# Patient Record
Sex: Male | Born: 1935 | Race: White | Hispanic: No | Marital: Married | Smoking: Former smoker
Health system: Southern US, Community
[De-identification: ages and names within clinical notes are randomized; demographics above are authoritative.]

## PROBLEM LIST (undated history)

## (undated) DIAGNOSIS — H9190 Unspecified hearing loss, unspecified ear: Secondary | ICD-10-CM

## (undated) DIAGNOSIS — Z978 Presence of other specified devices: Secondary | ICD-10-CM

## (undated) DIAGNOSIS — Z789 Other specified health status: Secondary | ICD-10-CM

## (undated) DIAGNOSIS — R339 Retention of urine, unspecified: Secondary | ICD-10-CM

---
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
## 2017-01-16 DIAGNOSIS — N401 Enlarged prostate with lower urinary tract symptoms: Secondary | ICD-10-CM | POA: Diagnosis not present

## 2017-01-16 DIAGNOSIS — R350 Frequency of micturition: Secondary | ICD-10-CM | POA: Diagnosis not present

## 2017-01-16 DIAGNOSIS — N138 Other obstructive and reflux uropathy: Secondary | ICD-10-CM | POA: Diagnosis not present

## 2017-01-16 DIAGNOSIS — J431 Panlobular emphysema: Secondary | ICD-10-CM | POA: Diagnosis not present

## 2017-04-17 DIAGNOSIS — J431 Panlobular emphysema: Secondary | ICD-10-CM | POA: Diagnosis not present

## 2017-04-17 DIAGNOSIS — N401 Enlarged prostate with lower urinary tract symptoms: Secondary | ICD-10-CM | POA: Diagnosis not present

## 2017-04-17 DIAGNOSIS — N138 Other obstructive and reflux uropathy: Secondary | ICD-10-CM | POA: Diagnosis not present

## 2017-10-09 DIAGNOSIS — Z23 Encounter for immunization: Secondary | ICD-10-CM | POA: Diagnosis not present

## 2018-03-02 DIAGNOSIS — J431 Panlobular emphysema: Secondary | ICD-10-CM | POA: Diagnosis not present

## 2018-03-02 DIAGNOSIS — N138 Other obstructive and reflux uropathy: Secondary | ICD-10-CM | POA: Diagnosis not present

## 2018-03-02 DIAGNOSIS — N401 Enlarged prostate with lower urinary tract symptoms: Secondary | ICD-10-CM | POA: Diagnosis not present

## 2018-09-07 DIAGNOSIS — Z23 Encounter for immunization: Secondary | ICD-10-CM | POA: Diagnosis not present

## 2019-02-03 DIAGNOSIS — J431 Panlobular emphysema: Secondary | ICD-10-CM | POA: Diagnosis not present

## 2019-02-03 DIAGNOSIS — N138 Other obstructive and reflux uropathy: Secondary | ICD-10-CM | POA: Diagnosis not present

## 2019-02-03 DIAGNOSIS — N401 Enlarged prostate with lower urinary tract symptoms: Secondary | ICD-10-CM | POA: Diagnosis not present

## 2019-02-03 DIAGNOSIS — M1711 Unilateral primary osteoarthritis, right knee: Secondary | ICD-10-CM | POA: Diagnosis not present

## 2019-03-13 ENCOUNTER — Emergency Department (HOSPITAL_BASED_OUTPATIENT_CLINIC_OR_DEPARTMENT_OTHER)
Admission: EM | Admit: 2019-03-13 | Discharge: 2019-03-13 | Disposition: A | Discharge status: Home / Self Care | Payer: Medicare Other | Attending: Emergency Medicine | Admitting: Emergency Medicine

## 2019-03-13 ENCOUNTER — Other Ambulatory Visit: Payer: Self-pay

## 2019-03-13 DIAGNOSIS — Z79899 Other long term (current) drug therapy: Secondary | ICD-10-CM | POA: Diagnosis not present

## 2019-03-13 DIAGNOSIS — E871 Hypo-osmolality and hyponatremia: Secondary | ICD-10-CM | POA: Insufficient documentation

## 2019-03-13 DIAGNOSIS — R339 Retention of urine, unspecified: Secondary | ICD-10-CM | POA: Diagnosis not present

## 2019-03-13 LAB — BASIC METABOLIC PANEL
Anion gap: 9 (ref 5–15)
BUN: 18 mg/dL (ref 8–23)
CO2: 24 mmol/L (ref 22–32)
Calcium: 8.6 mg/dL — ABNORMAL LOW (ref 8.9–10.3)
Chloride: 96 mmol/L — ABNORMAL LOW (ref 98–111)
Creatinine, Ser: 0.84 mg/dL (ref 0.61–1.24)
GFR calc Af Amer: >60 mL/min (ref >60)
GFR calc non Af Amer: >60 mL/min (ref >60)
Glucose, Bld: 102 mg/dL — ABNORMAL HIGH (ref 70–99)
Potassium: 3.4 mmol/L — ABNORMAL LOW (ref 3.5–5.1)
Sodium: 129 mmol/L — ABNORMAL LOW (ref 135–145)

## 2019-03-13 LAB — CBC WITH DIFFERENTIAL/PLATELET
Abs Immature Granulocytes: 0.06 10*3/uL (ref 0.00–0.07)
Basophils Absolute: 0 10*3/uL (ref 0.0–0.1)
Basophils Relative: 0 %
Eosinophils Absolute: 0.4 10*3/uL (ref 0.0–0.5)
Eosinophils Relative: 3 %
HCT: 42.7 % (ref 39.0–52.0)
Hemoglobin: 14.3 g/dL (ref 13.0–17.0)
Immature Granulocytes: 1 %
Lymphocytes Relative: 14 %
Lymphs Abs: 1.7 10*3/uL (ref 0.7–4.0)
MCH: 32.4 pg (ref 26.0–34.0)
MCHC: 33.5 g/dL (ref 30.0–36.0)
MCV: 96.6 fL (ref 80.0–100.0)
Monocytes Absolute: 1.4 10*3/uL — ABNORMAL HIGH (ref 0.1–1.0)
Monocytes Relative: 12 %
Neutro Abs: 8.5 10*3/uL — ABNORMAL HIGH (ref 1.7–7.7)
Neutrophils Relative %: 70 %
Platelets: 161 10*3/uL (ref 150–400)
RBC: 4.42 MIL/uL (ref 4.22–5.81)
RDW: 12.8 % (ref 11.5–15.5)
WBC: 12.1 10*3/uL — ABNORMAL HIGH (ref 4.0–10.5)
nRBC: 0 % (ref 0.0–0.2)

## 2019-03-13 LAB — URINALYSIS, MICROSCOPIC (REFLEX)

## 2019-03-13 LAB — URINALYSIS, ROUTINE W REFLEX MICROSCOPIC
Bilirubin Urine: NEGATIVE
Glucose, UA: NEGATIVE mg/dL
Ketones, ur: NEGATIVE mg/dL
Leukocytes,Ua: NEGATIVE
Nitrite: NEGATIVE
Protein, ur: NEGATIVE mg/dL
Specific Gravity, Urine: 1.01 (ref 1.005–1.030)
pH: 6 (ref 5.0–8.0)

## 2019-03-13 NOTE — ED Notes (Signed)
Natanael Saladin (son) Contact information [phone]. Please call when patient is being discharged.

## 2019-03-13 NOTE — ED Provider Notes (Signed)
[facility] EMERGENCY DEPARTMENT Provider Note   CSN: [id number] Arrival date & time: 03/13/19  1903    History   Chief Complaint Chief Complaint  Patient presents with  . Urinary Retention    HPI Donald Ellis is a 85 y.o. male.     Patient is a 85 year old male presents with difficulty urinating.  He does have a history of BPH and has been on Flomax for about 2 years.  He reports that over the last week he has had trouble getting his urine out and today has only been able to get little dribbles out.  He says it hurts when he tries to urinate.  He has increasing distention and pain to his lower abdomen.  No fevers.  No nausea or vomiting.  No history of similar symptoms in the past.     No past medical history on file.  There are no active problems to display for this patient.    The histories are not reviewed yet. Please review them in the "History" navigator section and refresh this [place].      Home Medications    Prior to Admission medications   Medication Sig Start Date End Date Taking? Authorizing Provider  ipratropium-albuterol (DUONEB) 0.5-2.5 (3) MG/3ML SOLN Inhale 3 ml vial three times a day in nebulizer 02/03/19  Yes [provider]  meloxicam (MOBIC) 15 MG tablet Take 15 mg by mouth daily.   Yes [provider]  tamsulosin (FLOMAX) 0.4 MG CAPS capsule Take 0.4 mg by mouth.   Yes [provider]    Family History No family history on file.  Social History Social History   Tobacco Use  . Smoking status: Not on file  Substance Use Topics  . Alcohol use: Not on file  . Drug use: Not on file     Allergies   Patient has no allergy information on record.   Review of Systems Review of Systems  Constitutional: Negative for chills, diaphoresis, fatigue and fever.  HENT: Negative for congestion, rhinorrhea and sneezing.   Eyes: Negative.   Respiratory: Negative for cough, chest tightness and shortness of  breath.   Cardiovascular: Negative for chest pain and leg swelling.  Gastrointestinal: Positive for abdominal pain. Negative for blood in stool, diarrhea, nausea and vomiting.  Genitourinary: Positive for difficulty urinating, dysuria and urgency. Negative for flank pain, frequency, hematuria, penile pain, scrotal swelling and testicular pain.  Musculoskeletal: Negative for arthralgias and back pain.  Skin: Negative for rash.  Neurological: Negative for dizziness, speech difficulty, weakness, numbness and headaches.     Physical Exam Updated Vital Signs BP (!) 156/81 (BP Location: Right Arm)   Pulse 99   Temp 97.6 F (36.4 C) (Oral)   Resp 17   Ht 5\' 9"  (1.753 m)   Wt 72.6 kg   SpO2 95%   BMI 23.63 kg/m   Physical Exam Constitutional:      Appearance: He is well-developed.  HENT:     Head: Normocephalic and atraumatic.  Eyes:     Pupils: Pupils are equal, round, and reactive to light.  Neck:     Musculoskeletal: Normal range of motion and neck supple.  Cardiovascular:     Rate and Rhythm: Normal rate and regular rhythm.     Heart sounds: Normal heart sounds.  Pulmonary:     Effort: Pulmonary effort is normal. No respiratory distress.     Breath sounds: Normal breath sounds. No wheezing or rales.  Chest:  Chest wall: No tenderness.  Abdominal:     General: Bowel sounds are normal. There is distension.     Palpations: Abdomen is soft.     Tenderness: There is no abdominal tenderness. There is no guarding or rebound.     Comments: Positive tenderness and distention to the lower abdomen  Genitourinary:    Comments: Normal-appearing circumcised male genitalia. Musculoskeletal: Normal range of motion.  Lymphadenopathy:     Cervical: No cervical adenopathy.  Skin:    General: Skin is warm and dry.     Findings: No rash.  Neurological:     Mental Status: He is alert and oriented to person, place, and time.      ED Treatments / Results  Labs (all labs ordered  are listed, but only abnormal results are displayed) Labs Reviewed  BASIC METABOLIC PANEL - Abnormal; Notable for the following components:      Result Value   Sodium 129 (*)    Potassium 3.4 (*)    Chloride 96 (*)    Glucose, Bld 102 (*)    Calcium 8.6 (*)    All other components within normal limits  CBC WITH DIFFERENTIAL/PLATELET - Abnormal; Notable for the following components:   WBC 12.1 (*)    Neutro Abs 8.5 (*)    Monocytes Absolute 1.4 (*)    All other components within normal limits  URINALYSIS, ROUTINE W REFLEX MICROSCOPIC - Abnormal; Notable for the following components:   Hgb urine dipstick SMALL (*)    All other components within normal limits  URINALYSIS, MICROSCOPIC (REFLEX) - Abnormal; Notable for the following components:   Bacteria, UA RARE (*)    All other components within normal limits  URINE CULTURE    EKG None  Radiology No results found.  Procedures Procedures (including critical care time)  Medications Ordered in ED Medications - No data to display   Initial Impression / Assessment and Plan / ED Course  I have reviewed the triage vital signs and the nursing notes.  Pertinent labs & imaging results that were available during my care of the patient were reviewed by me and considered in my medical decision making (see chart for details).        Patient presents with urinary retention.  A Foley catheter was placed and drained about 1200 cc of clear urine.  His urine does not appear to be infected.  It was sent for culture.  He was not started on antibiotics.  He feels 100% better.  His labs show a mild hyponatremia.  I discussed this with him and advised him that he will need to have this rechecked by his primary care doctor.  His creatinine is normal.  He was advised to call Monday make an appointment next week to follow-up with his PCP regarding catheter removal and recheck on his sodium.  Return precautions were given.  He was advised to continue  his Flomax.  Final Clinical Impressions(s) / ED Diagnoses   Final diagnoses:  Urinary retention  Hyponatremia    ED Discharge Orders    None       Malvin Johns, MD 03/13/19 2033

## 2019-03-13 NOTE — ED Notes (Signed)
Pt hard of hearing-- poor historian- medical history/medication unknown if accurate.

## 2019-03-13 NOTE — Discharge Instructions (Signed)
You will need to have your catheter removed by your primary care doctor.  Make an appointment to follow-up with them next week.  In addition, your sodium level was low at 129 and this needs to be rechecked by your primary care doctor.

## 2019-03-13 NOTE — ED Notes (Signed)
Foley Catheter unclamped at this time.

## 2019-03-13 NOTE — ED Triage Notes (Signed)
Pt presents with urinary retention x 3 days. Pt bent over in pain. HOH. Reports burning and dribbling urine.

## 2019-03-15 DIAGNOSIS — E871 Hypo-osmolality and hyponatremia: Secondary | ICD-10-CM | POA: Diagnosis not present

## 2019-03-15 DIAGNOSIS — R338 Other retention of urine: Secondary | ICD-10-CM | POA: Diagnosis not present

## 2019-03-15 DIAGNOSIS — K5900 Constipation, unspecified: Secondary | ICD-10-CM | POA: Diagnosis not present

## 2019-03-15 LAB — URINE CULTURE: Culture: NO GROWTH

## 2019-03-23 DIAGNOSIS — R3912 Poor urinary stream: Secondary | ICD-10-CM | POA: Diagnosis not present

## 2019-03-23 DIAGNOSIS — R350 Frequency of micturition: Secondary | ICD-10-CM | POA: Diagnosis not present

## 2019-03-23 DIAGNOSIS — R351 Nocturia: Secondary | ICD-10-CM | POA: Diagnosis not present

## 2019-03-29 DIAGNOSIS — R351 Nocturia: Secondary | ICD-10-CM | POA: Diagnosis not present

## 2019-03-29 DIAGNOSIS — R350 Frequency of micturition: Secondary | ICD-10-CM | POA: Diagnosis not present

## 2019-03-30 DIAGNOSIS — N138 Other obstructive and reflux uropathy: Secondary | ICD-10-CM | POA: Diagnosis not present

## 2019-03-30 DIAGNOSIS — N401 Enlarged prostate with lower urinary tract symptoms: Secondary | ICD-10-CM | POA: Diagnosis not present

## 2019-03-30 DIAGNOSIS — K5900 Constipation, unspecified: Secondary | ICD-10-CM | POA: Diagnosis not present

## 2019-03-30 DIAGNOSIS — J431 Panlobular emphysema: Secondary | ICD-10-CM | POA: Diagnosis not present

## 2019-04-06 DIAGNOSIS — R3912 Poor urinary stream: Secondary | ICD-10-CM | POA: Diagnosis not present

## 2019-04-06 DIAGNOSIS — R338 Other retention of urine: Secondary | ICD-10-CM | POA: Diagnosis not present

## 2019-04-07 DIAGNOSIS — R351 Nocturia: Secondary | ICD-10-CM | POA: Diagnosis not present

## 2019-04-07 DIAGNOSIS — R338 Other retention of urine: Secondary | ICD-10-CM | POA: Diagnosis not present

## 2019-04-16 DIAGNOSIS — N401 Enlarged prostate with lower urinary tract symptoms: Secondary | ICD-10-CM | POA: Diagnosis not present

## 2019-04-16 DIAGNOSIS — R351 Nocturia: Secondary | ICD-10-CM | POA: Diagnosis not present

## 2019-05-05 DIAGNOSIS — N401 Enlarged prostate with lower urinary tract symptoms: Secondary | ICD-10-CM | POA: Diagnosis not present

## 2019-05-05 DIAGNOSIS — R351 Nocturia: Secondary | ICD-10-CM | POA: Diagnosis not present

## 2019-05-05 DIAGNOSIS — R338 Other retention of urine: Secondary | ICD-10-CM | POA: Diagnosis not present

## 2019-05-17 DIAGNOSIS — R350 Frequency of micturition: Secondary | ICD-10-CM | POA: Diagnosis not present

## 2019-05-17 DIAGNOSIS — R351 Nocturia: Secondary | ICD-10-CM | POA: Diagnosis not present

## 2019-05-17 DIAGNOSIS — N401 Enlarged prostate with lower urinary tract symptoms: Secondary | ICD-10-CM | POA: Diagnosis not present

## 2019-05-19 DIAGNOSIS — N401 Enlarged prostate with lower urinary tract symptoms: Secondary | ICD-10-CM | POA: Diagnosis not present

## 2019-05-19 DIAGNOSIS — R338 Other retention of urine: Secondary | ICD-10-CM | POA: Diagnosis not present

## 2019-05-20 DIAGNOSIS — R338 Other retention of urine: Secondary | ICD-10-CM | POA: Diagnosis not present

## 2019-05-20 DIAGNOSIS — N401 Enlarged prostate with lower urinary tract symptoms: Secondary | ICD-10-CM | POA: Diagnosis not present

## 2019-05-27 DIAGNOSIS — N401 Enlarged prostate with lower urinary tract symptoms: Secondary | ICD-10-CM | POA: Diagnosis not present

## 2019-05-27 DIAGNOSIS — R338 Other retention of urine: Secondary | ICD-10-CM | POA: Diagnosis not present

## 2019-05-29 ENCOUNTER — Other Ambulatory Visit: Payer: Self-pay

## 2019-05-29 ENCOUNTER — Encounter (HOSPITAL_COMMUNITY): Payer: Self-pay | Admitting: Emergency Medicine

## 2019-05-29 ENCOUNTER — Emergency Department (HOSPITAL_COMMUNITY)
Admission: EM | Admit: 2019-05-29 | Discharge: 2019-05-29 | Disposition: A | Discharge status: Home / Self Care | Payer: Medicare Other | Attending: Emergency Medicine | Admitting: Emergency Medicine

## 2019-05-29 DIAGNOSIS — N3001 Acute cystitis with hematuria: Secondary | ICD-10-CM | POA: Diagnosis not present

## 2019-05-29 DIAGNOSIS — I10 Essential (primary) hypertension: Secondary | ICD-10-CM | POA: Diagnosis not present

## 2019-05-29 DIAGNOSIS — Z79899 Other long term (current) drug therapy: Secondary | ICD-10-CM | POA: Insufficient documentation

## 2019-05-29 DIAGNOSIS — R1084 Generalized abdominal pain: Secondary | ICD-10-CM | POA: Diagnosis not present

## 2019-05-29 DIAGNOSIS — R339 Retention of urine, unspecified: Secondary | ICD-10-CM | POA: Diagnosis not present

## 2019-05-29 DIAGNOSIS — R0902 Hypoxemia: Secondary | ICD-10-CM | POA: Diagnosis not present

## 2019-05-29 DIAGNOSIS — R52 Pain, unspecified: Secondary | ICD-10-CM | POA: Diagnosis not present

## 2019-05-29 LAB — CBC WITH DIFFERENTIAL/PLATELET
Abs Immature Granulocytes: 0.04 10*3/uL (ref 0.00–0.07)
Basophils Absolute: 0.1 10*3/uL (ref 0.0–0.1)
Basophils Relative: 1 %
Eosinophils Absolute: 0.2 10*3/uL (ref 0.0–0.5)
Eosinophils Relative: 2 %
HCT: 42.8 % (ref 39.0–52.0)
Hemoglobin: 14.6 g/dL (ref 13.0–17.0)
Immature Granulocytes: 0 %
Lymphocytes Relative: 11 %
Lymphs Abs: 1.1 10*3/uL (ref 0.7–4.0)
MCH: 32.5 pg (ref 26.0–34.0)
MCHC: 34.1 g/dL (ref 30.0–36.0)
MCV: 95.3 fL (ref 80.0–100.0)
Monocytes Absolute: 1 10*3/uL (ref 0.1–1.0)
Monocytes Relative: 10 %
Neutro Abs: 7.8 10*3/uL — ABNORMAL HIGH (ref 1.7–7.7)
Neutrophils Relative %: 76 %
Platelets: 150 10*3/uL (ref 150–400)
RBC: 4.49 MIL/uL (ref 4.22–5.81)
RDW: 12.8 % (ref 11.5–15.5)
WBC: 10.1 10*3/uL (ref 4.0–10.5)
nRBC: 0 % (ref 0.0–0.2)

## 2019-05-29 LAB — BASIC METABOLIC PANEL
Anion gap: 10 (ref 5–15)
BUN: 22 mg/dL (ref 8–23)
CO2: 23 mmol/L (ref 22–32)
Calcium: 9 mg/dL (ref 8.9–10.3)
Chloride: 103 mmol/L (ref 98–111)
Creatinine, Ser: 0.92 mg/dL (ref 0.61–1.24)
GFR calc Af Amer: >60 mL/min (ref >60)
GFR calc non Af Amer: >60 mL/min (ref >60)
Glucose, Bld: 105 mg/dL — ABNORMAL HIGH (ref 70–99)
Potassium: 4 mmol/L (ref 3.5–5.1)
Sodium: 136 mmol/L (ref 135–145)

## 2019-05-29 LAB — URINALYSIS, ROUTINE W REFLEX MICROSCOPIC
Bacteria, UA: NONE SEEN
Bilirubin Urine: NEGATIVE
Glucose, UA: NEGATIVE mg/dL
Ketones, ur: NEGATIVE mg/dL
Nitrite: NEGATIVE
Protein, ur: 100 mg/dL — AB
RBC / HPF: >50 RBC/hpf — ABNORMAL HIGH (ref 0–5)
Specific Gravity, Urine: 1.013 (ref 1.005–1.030)
WBC, UA: >50 WBC/hpf — ABNORMAL HIGH (ref 0–5)
pH: 6 (ref 5.0–8.0)

## 2019-05-29 MED ORDER — CEPHALEXIN 500 MG PO CAPS: 500.0000 mg | ORAL_CAPSULE | Freq: Three times a day (TID) | ORAL | 0 refills | Status: DC

## 2019-05-29 MED ORDER — CEPHALEXIN 250 MG PO CAPS
500.0000 mg | ORAL_CAPSULE | Freq: Once | ORAL | Status: AC
  Administered 2019-05-29: 500 mg via ORAL
  Filled 2019-05-29: qty 2

## 2019-05-29 NOTE — ED Triage Notes (Signed)
BIB [facility] from home with urinary retention since 2100 yesterday with associated abdominal pain. Bladder scanned 979 on arrival. Pt reports having recent condom cath and some type of urinary implant.

## 2019-05-29 NOTE — Discharge Instructions (Signed)
Please call your urologist/Dr. Alyson Ingles on Monday.  Please tell them that you had another catheter placed.  Tell them that you are also on an antibiotic for an infection.  Be sure to continue taking your Flomax. Do not remove the catheter

## 2019-05-29 NOTE — ED Notes (Signed)
Pt states he is on 2L [place] @ home.

## 2019-05-29 NOTE — ED Provider Notes (Signed)
[facility] EMERGENCY DEPARTMENT Provider Note   CSN: [id number] Arrival date & time: 05/29/19  0535     History   Chief Complaint Chief complaint - abdominal pain   HPI Donald Ellis is a 85 y.o. male.     The history is provided by the patient and the EMS personnel.  Abdominal Pain Pain location:  Suprapubic Pain quality: aching   Pain radiates to:  Does not radiate Pain severity:  Severe Onset quality:  Sudden Timing:  Constant Progression:  Worsening Chronicity:  New Relieved by:  Nothing Worsened by:  Movement and palpation Associated symptoms: no fever and no vomiting   patient with history of COPD, BPH presents with lower abdominal pain.  He reports urinary retention. He reports Similar episode in the past.   PMH- COPD, BPH Soc hx - former smoker Home Medications    Prior to Admission medications   Medication Sig Start Date End Date Taking? Authorizing Provider  ipratropium-albuterol (DUONEB) 0.5-2.5 (3) MG/3ML SOLN Inhale 3 ml vial three times a day in nebulizer 02/03/19   [provider]  meloxicam (MOBIC) 15 MG tablet Take 15 mg by mouth daily.    [provider]  tamsulosin (FLOMAX) 0.4 MG CAPS capsule Take 0.4 mg by mouth.    [provider]    Family History No family history on file.  Social History Social History   Tobacco Use  . Smoking status: Not on file  Substance Use Topics  . Alcohol use: Not on file  . Drug use: Not on file     Allergies   Patient has no allergy information on record.   Review of Systems Review of Systems  Constitutional: Negative for fever.  Gastrointestinal: Positive for abdominal pain. Negative for vomiting.  Genitourinary: Positive for difficulty urinating.  All other systems reviewed and are negative.    Physical Exam Updated Vital Signs BP 128/76 (BP Location: Right Arm)   Pulse 90   Temp 98.1 F (36.7 C)   Resp 18   Ht 1.778 m (5\' 10" )   Wt 72.6 kg    SpO2 (!) 87%   BMI 22.96 kg/m   Physical Exam CONSTITUTIONAL: elderly, anxious, moaning in pain, hard of hearing HEAD: Normocephalic/atraumatic EYES: EOMI ENMT: Mucous membranes moist NECK: supple no meningeal signs SPINE/BACK:entire spine nontender CV: S1/S2 noted, no murmurs/rubs/gallops noted LUNGS: Mild tachypnea noted, scattered wheezing noted ABDOMEN: soft, nontender, no rebound or guarding, bowel sounds noted throughout abdomen GU:no cva tenderness, normal external genitalia NEURO: Pt is awake/alert/appropriate, moves all extremitiesx4.  No facial droop.   EXTREMITIES: pulses normal/equal, full ROM SKIN: warm, color normal PSYCH: Anxious  ED Treatments / Results  Labs (all labs ordered are listed, but only abnormal results are displayed) Labs Reviewed  URINALYSIS, ROUTINE W REFLEX MICROSCOPIC - Abnormal; Notable for the following components:      Result Value   APPearance TURBID (*)    Hgb urine dipstick LARGE (*)    Protein, ur 100 (*)    Leukocytes,Ua LARGE (*)    RBC / HPF >50 (*)    WBC, UA >50 (*)    All other components within normal limits  BASIC METABOLIC PANEL - Abnormal; Notable for the following components:   Glucose, Bld 105 (*)    All other components within normal limits  CBC WITH DIFFERENTIAL/PLATELET - Abnormal; Notable for the following components:   Neutro Abs 7.8 (*)    All other components within normal limits  URINE CULTURE  EKG    Radiology No results found.  Procedures Procedures Medications Ordered in ED Medications  cephALEXin (KEFLEX) capsule 500 mg (has no administration in time range)     Initial Impression / Assessment and Plan / ED Course  I have reviewed the triage vital signs and the nursing notes.  Pertinent labs  results that were available during my care of the patient were reviewed by me and considered in my medical decision making (see chart for details).        5:46 AM Patient presents with abdominal  pain and difficulty urinating.  Strong suspicion this represents urinary retention.  Will obtain bladder scan 6:24 AM Patient had over 900 cc of urine.  He had immediate relief upon Foley catheter placement It is reported patient had a recent urologic procedure, but it is unclear what procedure. 6:47 AM Patient improved, urine is draining into Foley. He has no further abdominal tenderness.  He feels well. He has evidence of UTI, will start him on Keflex 3 times daily Patient handed me a card that reveals he had a UroLift procedure done on June 8.  This was done by Dr. Alyson Ingles from urology for BPH Advised patient to call his urologist in 2 days to inform them he had to place a catheter and be placed on antibiotics.  Patient is overall well-appearing, not septic appearing, no acute distress. Initial hypoxia resolved as he is on 2 L nasal cannula daily  Final Clinical Impressions(s) / ED Diagnoses   Final diagnoses:  Urinary retention  Acute cystitis with hematuria    ED Discharge Orders         Ordered    cephALEXin (KEFLEX) 500 MG capsule  3 times daily     05/29/19 0636           Ripley Fraise, MD 05/29/19 [phone]

## 2019-05-29 NOTE — ED Notes (Signed)
Patient verbalizes understanding of discharge instructions. Opportunity for questioning and answers were provided. Armband removed by staff, pt discharged from ED home via POV with family. 

## 2019-05-31 LAB — URINE CULTURE
Culture: >=100000 — AB
Special Requests: NORMAL

## 2019-06-01 ENCOUNTER — Telehealth: Payer: Self-pay | Admitting: Emergency Medicine

## 2019-06-01 NOTE — Telephone Encounter (Signed)
Post ED Visit - Positive Culture Follow-up: Successful Patient Follow-Up  Culture assessed and recommendations reviewed by:  []  Elenor Quinones, Pharm.D. []  Heide Guile, Pharm.D., BCPS AQ-ID []  Parks Neptune, Pharm.D., BCPS []  Alycia Rossetti, Pharm.D., BCPS []  [place], [place].D., BCPS, AAHIVP []  Legrand Como, Pharm.D., BCPS, AAHIVP []  Salome Arnt, PharmD, BCPS [x]  Johnnette Gourd, PharmD, BCPS []  Hughes Better, PharmD, BCPS []  Leeroy Cha, PharmD  Positive urine culture  []  Patient discharged without antimicrobial prescription and treatment is now indicated [x]  Organism is resistant to prescribed ED discharge antimicrobial []  Patient with positive blood cultures  Changes discussed with ED provider: Langston Masker PA New antibiotic prescription stop cephalexin, start septra 1 ds tab po bid x 14 days  Attempting to contact patient   Hazle Nordmann 06/01/2019, 11:05 AM

## 2019-06-07 DIAGNOSIS — N3000 Acute cystitis without hematuria: Secondary | ICD-10-CM | POA: Diagnosis not present

## 2019-06-07 DIAGNOSIS — R338 Other retention of urine: Secondary | ICD-10-CM | POA: Diagnosis not present

## 2019-06-10 DIAGNOSIS — R338 Other retention of urine: Secondary | ICD-10-CM | POA: Diagnosis not present

## 2019-06-21 ENCOUNTER — Other Ambulatory Visit: Payer: Self-pay | Admitting: Urology

## 2019-06-24 DIAGNOSIS — M1711 Unilateral primary osteoarthritis, right knee: Secondary | ICD-10-CM | POA: Diagnosis not present

## 2019-06-28 ENCOUNTER — Other Ambulatory Visit (HOSPITAL_COMMUNITY)
Admission: RE | Admit: 2019-06-28 | Discharge: 2019-06-28 | Disposition: A | Discharge status: Home / Self Care | Payer: Medicare Other | Source: Ambulatory Visit | Attending: Urology | Admitting: Urology

## 2019-06-28 DIAGNOSIS — Z1159 Encounter for screening for other viral diseases: Secondary | ICD-10-CM | POA: Insufficient documentation

## 2019-06-28 DIAGNOSIS — R338 Other retention of urine: Secondary | ICD-10-CM | POA: Diagnosis not present

## 2019-06-28 DIAGNOSIS — R310 Gross hematuria: Secondary | ICD-10-CM | POA: Diagnosis not present

## 2019-06-29 ENCOUNTER — Other Ambulatory Visit: Payer: Self-pay

## 2019-06-29 ENCOUNTER — Encounter (HOSPITAL_BASED_OUTPATIENT_CLINIC_OR_DEPARTMENT_OTHER): Payer: Self-pay | Admitting: *Deleted

## 2019-06-29 LAB — SARS CORONAVIRUS 2 (TAT 6-24 HRS): SARS Coronavirus 2: NEGATIVE

## 2019-06-29 NOTE — Progress Notes (Signed)
Spoke with patient and wife via telephone for pre op interview. Patient very Donald Ellis, wife will need to be in pre op. NPO after MN. Patient to take Flomax AM of surgery with a sip of water. Arrival time 1030.

## 2019-07-01 ENCOUNTER — Other Ambulatory Visit: Payer: Self-pay

## 2019-07-01 ENCOUNTER — Ambulatory Visit (HOSPITAL_BASED_OUTPATIENT_CLINIC_OR_DEPARTMENT_OTHER): Payer: Medicare Other | Admitting: Anesthesiology

## 2019-07-01 ENCOUNTER — Encounter (HOSPITAL_BASED_OUTPATIENT_CLINIC_OR_DEPARTMENT_OTHER): Payer: Self-pay | Admitting: Anesthesiology

## 2019-07-01 ENCOUNTER — Encounter (HOSPITAL_BASED_OUTPATIENT_CLINIC_OR_DEPARTMENT_OTHER)
Admission: RE | Disposition: A | Discharge status: Home / Self Care | Payer: Self-pay | Source: Home / Self Care | Attending: Urology

## 2019-07-01 ENCOUNTER — Ambulatory Visit (HOSPITAL_BASED_OUTPATIENT_CLINIC_OR_DEPARTMENT_OTHER)
Admission: RE | Admit: 2019-07-01 | Discharge: 2019-07-01 | Disposition: A | Discharge status: Home / Self Care | Payer: Medicare Other | Attending: Urology | Admitting: Urology

## 2019-07-01 DIAGNOSIS — R338 Other retention of urine: Secondary | ICD-10-CM | POA: Diagnosis not present

## 2019-07-01 DIAGNOSIS — N138 Other obstructive and reflux uropathy: Secondary | ICD-10-CM | POA: Diagnosis not present

## 2019-07-01 DIAGNOSIS — N401 Enlarged prostate with lower urinary tract symptoms: Secondary | ICD-10-CM | POA: Insufficient documentation

## 2019-07-01 DIAGNOSIS — N400 Enlarged prostate without lower urinary tract symptoms: Secondary | ICD-10-CM | POA: Diagnosis not present

## 2019-07-01 DIAGNOSIS — Z87891 Personal history of nicotine dependence: Secondary | ICD-10-CM | POA: Diagnosis not present

## 2019-07-01 DIAGNOSIS — N320 Bladder-neck obstruction: Secondary | ICD-10-CM | POA: Diagnosis not present

## 2019-07-01 HISTORY — DX: Unspecified hearing loss, unspecified ear: H91.90

## 2019-07-01 HISTORY — PX: CYSTOSCOPY WITH INSERTION OF UROLIFT: SHX6678

## 2019-07-01 SURGERY — CYSTOSCOPY WITH INSERTION OF UROLIFT
Anesthesia: General | Site: Prostate

## 2019-07-01 MED ORDER — FENTANYL CITRATE (PF) 100 MCG/2ML IJ SOLN
25.0000 ug | INTRAMUSCULAR | Status: DC | PRN
  Administered 2019-07-01 (×2): 25 ug via INTRAVENOUS
  Filled 2019-07-01: qty 1

## 2019-07-01 MED ORDER — ACETAMINOPHEN 160 MG/5ML PO SOLN
325.0000 mg | ORAL | Status: DC | PRN
  Filled 2019-07-01: qty 20.3

## 2019-07-01 MED ORDER — OXYCODONE HCL 5 MG PO TABS
5.0000 mg | ORAL_TABLET | Freq: Once | ORAL | Status: DC | PRN
  Filled 2019-07-01: qty 1

## 2019-07-01 MED ORDER — DEXAMETHASONE SODIUM PHOSPHATE 10 MG/ML IJ SOLN
INTRAMUSCULAR | Status: AC
  Filled 2019-07-01: qty 1

## 2019-07-01 MED ORDER — FENTANYL CITRATE (PF) 100 MCG/2ML IJ SOLN
INTRAMUSCULAR | Status: AC
  Filled 2019-07-01: qty 2

## 2019-07-01 MED ORDER — ONDANSETRON HCL 4 MG/2ML IJ SOLN
INTRAMUSCULAR | Status: AC
  Filled 2019-07-01: qty 2

## 2019-07-01 MED ORDER — CEFAZOLIN SODIUM-DEXTROSE 2-4 GM/100ML-% IV SOLN
2.0000 g | INTRAVENOUS | Status: AC
  Administered 2019-07-01: 13:00:00 2 g via INTRAVENOUS
  Filled 2019-07-01: qty 100

## 2019-07-01 MED ORDER — ACETAMINOPHEN 325 MG PO TABS
325.0000 mg | ORAL_TABLET | ORAL | Status: DC | PRN
  Filled 2019-07-01: qty 2

## 2019-07-01 MED ORDER — MEPERIDINE HCL 25 MG/ML IJ SOLN
6.2500 mg | INTRAMUSCULAR | Status: DC | PRN
  Filled 2019-07-01: qty 1

## 2019-07-01 MED ORDER — LIDOCAINE 2% (20 MG/ML) 5 ML SYRINGE
INTRAMUSCULAR | Status: DC | PRN
  Administered 2019-07-01: 80 mg via INTRAVENOUS

## 2019-07-01 MED ORDER — OXYCODONE HCL 5 MG/5ML PO SOLN
5.0000 mg | Freq: Once | ORAL | Status: DC | PRN
  Filled 2019-07-01: qty 5

## 2019-07-01 MED ORDER — FENTANYL CITRATE (PF) 100 MCG/2ML IJ SOLN
INTRAMUSCULAR | Status: AC
  Filled 2019-07-01: qty 4

## 2019-07-01 MED ORDER — CEFAZOLIN SODIUM-DEXTROSE 2-4 GM/100ML-% IV SOLN
INTRAVENOUS | Status: AC
  Filled 2019-07-01: qty 100

## 2019-07-01 MED ORDER — LIDOCAINE 2% (20 MG/ML) 5 ML SYRINGE
INTRAMUSCULAR | Status: AC
  Filled 2019-07-01: qty 5

## 2019-07-01 MED ORDER — PROPOFOL 10 MG/ML IV BOLUS
INTRAVENOUS | Status: DC | PRN
  Administered 2019-07-01: 40 mg via INTRAVENOUS
  Administered 2019-07-01: 120 mg via INTRAVENOUS

## 2019-07-01 MED ORDER — PROPOFOL 10 MG/ML IV BOLUS
INTRAVENOUS | Status: AC
  Filled 2019-07-01: qty 40

## 2019-07-01 MED ORDER — STERILE WATER FOR IRRIGATION IR SOLN
Status: DC | PRN
  Administered 2019-07-01: 3000 mL

## 2019-07-01 MED ORDER — DEXAMETHASONE SODIUM PHOSPHATE 10 MG/ML IJ SOLN
INTRAMUSCULAR | Status: DC | PRN
  Administered 2019-07-01: 4 mg via INTRAVENOUS

## 2019-07-01 MED ORDER — FENTANYL CITRATE (PF) 100 MCG/2ML IJ SOLN
INTRAMUSCULAR | Status: DC | PRN
  Administered 2019-07-01: 25 ug via INTRAVENOUS

## 2019-07-01 MED ORDER — LACTATED RINGERS IV SOLN
INTRAVENOUS | Status: DC
  Administered 2019-07-01: 11:00:00 via INTRAVENOUS
  Filled 2019-07-01: qty 1000

## 2019-07-01 MED ORDER — ONDANSETRON HCL 4 MG/2ML IJ SOLN
4.0000 mg | Freq: Once | INTRAMUSCULAR | Status: DC | PRN
  Filled 2019-07-01: qty 2

## 2019-07-01 MED ORDER — ONDANSETRON HCL 4 MG/2ML IJ SOLN
INTRAMUSCULAR | Status: DC | PRN
  Administered 2019-07-01: 4 mg via INTRAVENOUS

## 2019-07-01 SURGICAL SUPPLY — 24 items
BAG DRAIN URO-CYSTO SKYTR STRL (DRAIN) ×3 IMPLANT
BAG URINE DRAINAGE (UROLOGICAL SUPPLIES) ×3 IMPLANT
CATH FOLEY 2WAY SLVR  5CC 16FR (CATHETERS)
CATH FOLEY 2WAY SLVR  5CC 18FR (CATHETERS) ×2
CATH FOLEY 2WAY SLVR 5CC 16FR (CATHETERS) IMPLANT
CATH FOLEY 2WAY SLVR 5CC 18FR (CATHETERS) ×1 IMPLANT
CLOTH BEACON ORANGE TIMEOUT ST (SAFETY) ×3 IMPLANT
GLOVE BIO SURGEON STRL SZ7 (GLOVE) ×3 IMPLANT
GLOVE BIO SURGEON STRL SZ8 (GLOVE) ×3 IMPLANT
GLOVE BIOGEL PI IND STRL 8.5 (GLOVE) IMPLANT
GLOVE BIOGEL PI INDICATOR 8.5 (GLOVE)
GLOVE SURG SS PI 8.5 STRL IVOR (GLOVE)
GLOVE SURG SS PI 8.5 STRL STRW (GLOVE) IMPLANT
GOWN STRL REUS W/ TWL LRG LVL3 (GOWN DISPOSABLE) ×1 IMPLANT
GOWN STRL REUS W/TWL LRG LVL3 (GOWN DISPOSABLE) ×2
GOWN STRL REUS W/TWL XL LVL3 (GOWN DISPOSABLE) ×3 IMPLANT
HOLDER FOLEY CATH W/STRAP (MISCELLANEOUS) ×3 IMPLANT
IV NS IRRIG 3000ML ARTHROMATIC (IV SOLUTION) IMPLANT
MANIFOLD NEPTUNE II (INSTRUMENTS) ×3 IMPLANT
PACK CYSTO (CUSTOM PROCEDURE TRAY) ×3 IMPLANT
SYSTEM UROLIFT (Male Continence) ×18 IMPLANT
TUBE CONNECTING 12'X1/4 (SUCTIONS) ×1
TUBE CONNECTING 12X1/4 (SUCTIONS) ×2 IMPLANT
WATER STERILE IRR 3000ML UROMA (IV SOLUTION) ×3 IMPLANT

## 2019-07-01 NOTE — Op Note (Signed)
   PREOPERATIVE DIAGNOSIS: Benign prostatic hypertrophy with bladder outlet obstruction.  POSTOPERATIVE DIAGNOSIS: Benign prostatic hypertrophy with bladder outlet obstruction.  PROCEDURE: Cystoscopy with implantation of UroLift devices, 6 implants.  SURGEON: [name], M.D.  ANESTHESIA: General  ANTIBIOTICS: ancef  SPECIMEN: None.  DRAINS: A 18-French Foley catheter.  BLOOD LOSS: Minimal.  COMPLICATIONS: None.  INDICATIONS:The Patient is an 85 year old  male with BPH and bladder outlet obstruction. He has failed medical therapy and has elected UroLift for definitive treatment.  FINDINGS OF PROCEDURE: He was taken to the operating room where a genral anesthetic was induced. He was placed in lithotomy position and was fitted with PAS hose. His perineum and genitalia were prepped with chlorhexidine, and he was draped in usual sterile fashion.  Cystoscopy was performed using the UroLift scope and 0 degree lens. Examination revealed a normal urethra. The external sphincter was intact. Prostatic urethra was approximately 6 cm in length with lateral lobe enlargement. There was also little bit of bladder neck elevation. Inspection of bladder revealed mild-to-moderate trabeculation with no tumors, stones, or inflammation. No cellules or diverticula were noted. Ureteral orifices were in their normal anatomic position effluxing clear urine.  After initial cystoscopy, the visual obturator was replaced with the first UroLift device. This was turned to the 9 o'clock position and pulled back to the veru and then slightly advanced. Pressure was then applied to the right lateral lobe and the UroLift device was deployed.  The second UroLift device was then inserted and applied to the left lateral lobe at 3 o'clock and deployed in the mid prostatic urethra. After this, there was still some apparent obstruction closer to the bladder neck. So a second level  of UroLift your left device was applied between the mid urethra and the proximal urethra providing further patency to the prostatic urethra. At this point, there was mild bleeding but the patient did have a spinal anesthetic. So it was thought that a Foley catheter was indicated. The scope was removed and a 18-French Foley catheter was inserted without difficulty. The balloon was filled with 10 mL sterile fluid, and the catheter was placed to straight drainage.  COMPLICATIONS: None   CONDITION: Stable, extubated, transferred to PACU  PLAN: The patient will be discharged home and followup in 2 days for a voiding trial.

## 2019-07-01 NOTE — Anesthesia Preprocedure Evaluation (Signed)
Anesthesia Evaluation  Patient identified by MRN, date of birth, ID band Patient awake    Reviewed: Allergy & Precautions, NPO status , Patient's Chart, lab work & pertinent test results  Airway Mallampati: I       Dental   Pulmonary former smoker,    Pulmonary exam normal breath sounds clear to auscultation       Cardiovascular negative cardio ROS Normal cardiovascular exam Rhythm:Regular Rate:Normal     Neuro/Psych negative neurological ROS  negative psych ROS   GI/Hepatic negative GI ROS, Neg liver ROS,   Endo/Other  negative endocrine ROS  Renal/GU negative Renal ROS Bladder dysfunction      Musculoskeletal negative musculoskeletal ROS (+)   Abdominal Normal abdominal exam  (+)   Peds  Hematology negative hematology ROS (+)   Anesthesia Other Findings   Reproductive/Obstetrics                             Anesthesia Physical Anesthesia Plan  ASA: II  Anesthesia Plan: General   Post-op Pain Management:    Induction: Intravenous  PONV Risk Score and Plan: Treatment may vary due to age or medical condition  Airway Management Planned: LMA  Additional Equipment:   Intra-op Plan:   Post-operative Plan: Extubation in OR  Informed Consent: I have reviewed the patients History and Physical, chart, labs and discussed the procedure including the risks, benefits and alternatives for the proposed anesthesia with the patient or authorized representative who has indicated his/her understanding and acceptance.     Dental advisory given  Plan Discussed with: CRNA  Anesthesia Plan Comments:         Anesthesia Quick Evaluation

## 2019-07-01 NOTE — Anesthesia Procedure Notes (Signed)
Procedure Name: LMA Insertion Date/Time: 07/01/2019 12:45 PM Performed by: Bonney Aid, CRNA Pre-anesthesia Checklist: Patient identified, Emergency Drugs available, Suction available and Patient being monitored Patient Re-evaluated:Patient Re-evaluated prior to induction Oxygen Delivery Method: Circle system utilized Preoxygenation: Pre-oxygenation with 100% oxygen Induction Type: IV induction Ventilation: Mask ventilation without difficulty LMA: LMA inserted LMA Size: 5.0 Number of attempts: 1 Airway Equipment and Method: Bite block Placement Confirmation: positive ETCO2 Tube secured with: Tape Dental Injury: Teeth and Oropharynx as per pre-operative assessment

## 2019-07-01 NOTE — Discharge Instructions (Signed)
Indwelling Urinary Catheter Care, Adult °An indwelling urinary catheter is a thin tube that is put into your bladder. The tube helps to drain pee (urine) out of your body. The tube goes in through your urethra. Your urethra is where pee comes out of your body. Your pee will come out through the catheter, then it will go into a bag (drainage bag). °Take good care of your catheter so it will work well. °How to wear your catheter and bag °Supplies needed °· Sticky tape (adhesive tape) or a leg strap. °· Alcohol wipe or soap and water (if you use tape). °· A clean towel (if you use tape). °· Large overnight bag. °· Smaller bag (leg bag). °Wearing your catheter °Attach your catheter to your leg with tape or a leg strap. °· Make sure the catheter is not pulled tight. °· If a leg strap gets wet, take it off and put on a dry strap. °· If you use tape to hold the bag on your leg: °1. Use an alcohol wipe or soap and water to wash your skin where the tape made it sticky before. °2. Use a clean towel to pat-dry that skin. °3. Use new tape to make the bag stay on your leg. °Wearing your bags °You should have been given a large overnight bag. °· You may wear the overnight bag in the day or night. °· Always have the overnight bag lower than your bladder.  Do not let the bag touch the floor. °· Before you go to sleep, put a clean plastic bag in a wastebasket. Then hang the overnight bag inside the wastebasket. °You should also have a smaller leg bag that fits under your clothes. °· Always wear the leg bag below your knee. °· Do not wear your leg bag at night. °How to care for your skin and catheter °Supplies needed °· A clean washcloth. °· Water and mild soap. °· A clean towel. °Caring for your skin and catheter ° °  ° °· Clean the skin around your catheter every day: °? Wash your hands with soap and water. °? Wet a clean washcloth in warm water and mild soap. °? Clean the skin around your urethra. °? If you are male: °? Gently  spread the folds of skin around your vagina (labia). °? With the washcloth in your other hand, wipe the inner side of your labia on each side. Wipe from front to back. °? If you are male: °? Pull back any skin that covers the end of your penis (foreskin). °? With the washcloth in your other hand, wipe your penis in small circles. Start wiping at the tip of your penis, then move away from the catheter. °? Move the foreskin back in place, if needed. °? With your free hand, hold the catheter close to where it goes into your body. °? Keep holding the catheter during cleaning so it does not get pulled out. °? With the washcloth in your other hand, clean the catheter. °? Only wipe downward on the catheter. °? Do not wipe upward toward your body. Doing this may push germs into your urethra and cause infection. °? Use a clean towel to pat-dry the catheter and the skin around it. Make sure to wipe off all soap. °? Wash your hands with soap and water. °· Shower every day. Do not take baths. °· Do not use cream, ointment, or lotion on the area where the catheter goes into your body, unless your doctor tells you   to. °· Do not use powders, sprays, or lotions on your genital area. °· Check your skin around the catheter every day for signs of infection. Check for: °? Redness, swelling, or pain. °? Fluid or blood. °? Warmth. °? Pus or a bad smell. °How to empty the bag °Supplies needed °· Rubbing alcohol. °· Gauze pad or cotton ball. °· Tape or a leg strap. °Emptying the bag °Pour the pee out of your bag when it is ?-½ full, or at least 2-3 times a day. Do this for your overnight bag and your leg bag. °1. Wash your hands with soap and water. °2. Separate (detach) the bag from your leg. °3. Hold the bag over the toilet or a clean pail. Keep the bag lower than your hips and bladder. This is so the pee (urine) does not go back into the tube. °4. Open the pour spout. It is at the bottom of the bag. °5. Empty the pee into the toilet or  pail. Do not let the pour spout touch any surface. °6. Put rubbing alcohol on a gauze pad or cotton ball. °7. Use the gauze pad or cotton ball to clean the pour spout. °8. Close the pour spout. °9. Attach the bag to your leg with tape or a leg strap. °10. Wash your hands with soap and water. °Follow instructions for cleaning the drainage bag: °· From the product maker. °· As told by your doctor. °How to change the bag °Supplies needed °· Alcohol wipes. °· A clean bag. °· Tape or a leg strap. °Changing the bag °Replace your bag when it starts to leak, smell bad, or look dirty. °1. Wash your hands with soap and water. °2. Separate the dirty bag from your leg. °3. Pinch the catheter with your fingers so that pee does not spill out. °4. Separate the catheter tube from the bag tube where these tubes connect (at the connection valve). Do not let the tubes touch any surface. °5. Clean the end of the catheter tube with an alcohol wipe. Use a different alcohol wipe to clean the end of the bag tube. °6. Connect the catheter tube to the tube of the clean bag. °7. Attach the clean bag to your leg with tape or a leg strap. Do not make the bag tight on your leg. °8. Wash your hands with soap and water. °General rules ° °· Never pull on your catheter. Never try to take it out. Doing that can hurt you. °· Always wash your hands before and after you touch your catheter or bag. Use a mild, fragrance-free soap. If you do not have soap and water, use hand sanitizer. °· Always make sure there are no twists or bends (kinks) in the catheter tube. °· Always make sure there are no leaks in the catheter or bag. °· Drink enough fluid to keep your pee pale yellow. °· Do not take baths, swim, or use a hot tub. °· If you are male, wipe from front to back after you poop (have a bowel movement). °Contact a doctor if: °· Your pee is cloudy. °· Your pee smells worse than usual. °· Your catheter gets clogged. °· Your catheter leaks. °· Your bladder  feels full. °Get help right away if: °· You have redness, swelling, or pain where the catheter goes into your body. °· You have fluid, blood, pus, or a bad smell coming from the area where the catheter goes into your body. °· Your skin feels warm where   the catheter goes into your body.  You have a fever.  You have pain in your: ? Belly (abdomen). ? Legs. ? Lower back. ? Bladder.  You see blood in the catheter.  Your pee is pink or red.  You feel sick to your stomach (nauseous).  You throw up (vomit).  You have chills.  Your pee is not draining into the bag.  Your catheter gets pulled out. Summary  An indwelling urinary catheter is a thin tube that is placed into the bladder to help drain pee (urine) out of the body.  The catheter is placed into the part of the body that drains pee from the bladder (urethra).  Taking good care of your catheter will keep it working properly and help prevent problems.  Always wash your hands before and after touching your catheter or bag.  Never pull on your catheter or try to take it out. This information is not intended to replace advice given to you by your health care provider. Make sure you discuss any questions you have with your health care provider. Document Released: 03/22/2013 Document Revised: 03/19/2019 Document Reviewed: 07/11/2017 Elsevier Patient Education  [facility] Instructions  Activity: Get plenty of rest for the remainder of the day. A responsible individual must stay with you for 24 hours following the procedure.  For the next 24 hours, DO NOT: -Drive a car -Paediatric nurse -Drink alcoholic beverages -Take any medication unless instructed by your physician -Make any legal decisions or sign important papers.  Meals: Start with liquid foods such as gelatin or soup. Progress to regular foods as tolerated. Avoid greasy, spicy, heavy foods. If nausea and/or vomiting occur, drink  only clear liquids until the nausea and/or vomiting subsides. Call your physician if vomiting continues.  Special Instructions/Symptoms: Your throat may feel dry or sore from the anesthesia or the breathing tube placed in your throat during surgery. If this causes discomfort, gargle with warm salt water. The discomfort should disappear within 24 hours.

## 2019-07-01 NOTE — Transfer of Care (Signed)
Immediate Anesthesia Transfer of Care Note  Patient: Donald Ellis  Procedure(s) Performed: CYSTOSCOPY WITH INSERTION OF UROLIFT (N/A )  Patient Location: PACU  Anesthesia Type:General  Level of Consciousness: sedated  Airway & Oxygen Therapy: Patient Spontanous Breathing and Patient connected to nasal cannula oxygen  Post-op Assessment: Report given to RN  Post vital signs: Reviewed and stable  Last Vitals:  Vitals Value Taken Time  BP 140/86 07/01/19 1313  Temp    Pulse 80 07/01/19 1314  Resp 18 07/01/19 1314  SpO2 100 % 07/01/19 1314  Vitals shown include unvalidated device data.  Last Pain:  Vitals:   07/01/19 1100  TempSrc:   PainSc: 0-No pain      Patients Stated Pain Goal: 5 (73/08/56 9437)  Complications: No apparent anesthesia complications

## 2019-07-01 NOTE — OR Nursing (Signed)
Foley catheter was removed by [place]. 100 cc urine output

## 2019-07-01 NOTE — H&P (Signed)
Urology Admission H&P  Chief Complaint: Urinary rentention  History of Present Illness: [name] is a 85yo with a hx of BPH and urinary retention currently managed withan indwelling foley. He underwent Urolift and was unable to void after surgery. No fevers, chills, sweats. No nausea/vomiting  Past Medical History:  Diagnosis Date  . HOH (hard of hearing)   . No pertinent past medical history    History reviewed. No pertinent surgical history.  Home Medications:  Current Facility-Administered Medications  Medication Dose Route Frequency Provider Last Rate Last Dose  . ceFAZolin (ANCEF) IVPB 2g/100 mL premix  2 g Intravenous 30 min Pre-Op Alyson Ingles Candee Furbish, MD      . lactated ringers infusion   Intravenous Continuous Myrtie Soman, MD 50 mL/hr at 07/01/19 1100     Allergies: No Known Allergies  History reviewed. No pertinent family history. Social History:  reports that he has quit smoking. He has never used smokeless tobacco. He reports previous alcohol use. He reports that he does not use drugs.  Review of Systems  All other systems reviewed and are negative.   Physical Exam:  Vital signs in last 24 hours: Temp:  [97.9 F (36.6 C)] 97.9 F (36.6 C) (07/23 1034) Pulse Rate:  [88] 88 (07/23 1034) Resp:  [16] 16 (07/23 1034) BP: (153)/(83) 153/83 (07/23 1034) SpO2:  [97 %] 97 % (07/23 1034) Weight:  [69.7 kg] 69.7 kg (07/23 1034) Physical Exam  Constitutional: He is oriented to person, place, and time. He appears well-developed and well-nourished.  HENT:  Head: Normocephalic and atraumatic.  Eyes: Pupils are equal, round, and reactive to light. EOM are normal.  Neck: Normal range of motion. No thyromegaly present.  Cardiovascular: Normal rate and regular rhythm.  Respiratory: Effort normal. No respiratory distress.  GI: Soft. He exhibits no distension.  Musculoskeletal: Normal range of motion.        General: No edema.  Neurological: He is alert and oriented to  person, place, and time.  Skin: Skin is warm and dry.  Psychiatric: He has a normal mood and affect. His behavior is normal. Judgment and thought content normal.    Laboratory Data:  No results found for this or any previous visit (from the past 24 hour(s)). Recent Results (from the past 240 hour(s))  SARS Coronavirus 2 (Performed in [place] hospital lab)     Status: None   Collection Time: 06/28/19 12:21 PM   Specimen: Nasal Swab  Result Value Ref Range Status   SARS Coronavirus 2 NEGATIVE NEGATIVE Final    Comment: (NOTE) SARS-CoV-2 target nucleic acids are NOT DETECTED. The SARS-CoV-2 RNA is generally detectable in upper and lower respiratory specimens during the acute phase of infection. Negative results do not preclude SARS-CoV-2 infection, do not rule out co-infections with other pathogens, and should not be used as the sole basis for treatment or other patient management decisions. Negative results must be combined with clinical observations, patient history, and epidemiological information. The expected result is Negative. Fact Sheet for Patients: SugarRoll.be Fact Sheet for Healthcare Providers: https://www.woods-mathews.com/ This test is not yet approved or cleared by the [place] FDA and  has been authorized for detection and/or diagnosis of SARS-CoV-2 by FDA under an Emergency Use Authorization (EUA). This EUA will remain  in effect (meaning this test can be used) for the duration of the COVID-19 declaration under Section 56 4(b)(1) of the Act, 21 U.S.C. section 360bbb-3(b)(1), unless the authorization is terminated or revoked sooner. Performed at [facility]  [facility], [place] [address], [place], [place] [address]    Creatinine: No results for input(s): CREATININE in the last 168 hours. Baseline Creatinine: unknown  Impression/Assessment:  85 yo with BPH and urinary retention  Plan:  The risks/benefits/alternatives  to Urolift was explained to the patient and he understands and wishes to proceed with surgery  [name] 07/01/2019, 12:30 PM

## 2019-07-02 ENCOUNTER — Encounter (HOSPITAL_BASED_OUTPATIENT_CLINIC_OR_DEPARTMENT_OTHER): Payer: Self-pay | Admitting: Urology

## 2019-07-02 NOTE — Anesthesia Postprocedure Evaluation (Signed)
Anesthesia Post Note  Patient: Bentleigh Stankus  Procedure(s) Performed: CYSTOSCOPY WITH INSERTION OF UROLIFT (N/A Prostate)     Anesthesia Type: General Level of consciousness: awake Pain management: pain level controlled Vital Signs Assessment: post-procedure vital signs reviewed and stable Respiratory status: spontaneous breathing Cardiovascular status: stable Postop Assessment: no apparent nausea or vomiting Anesthetic complications: no    Last Vitals:  Vitals:   07/01/19 1415 07/01/19 1607  BP: (!) 146/84 124/68  Pulse: 74 68  Resp: 17 16  Temp:  36.8 C  SpO2: 95% 97%    Last Pain:  Vitals:   07/02/19 1006  TempSrc:   PainSc: 2    Pain Goal: Patients Stated Pain Goal: 5 (07/01/19 1100)                 Huston Foley

## 2019-07-05 DIAGNOSIS — R338 Other retention of urine: Secondary | ICD-10-CM | POA: Diagnosis not present

## 2019-07-05 DIAGNOSIS — N401 Enlarged prostate with lower urinary tract symptoms: Secondary | ICD-10-CM | POA: Diagnosis not present

## 2019-07-06 DIAGNOSIS — R338 Other retention of urine: Secondary | ICD-10-CM | POA: Diagnosis not present

## 2019-07-13 DIAGNOSIS — R338 Other retention of urine: Secondary | ICD-10-CM | POA: Diagnosis not present

## 2019-07-22 DIAGNOSIS — N3000 Acute cystitis without hematuria: Secondary | ICD-10-CM | POA: Diagnosis not present

## 2019-07-22 DIAGNOSIS — R338 Other retention of urine: Secondary | ICD-10-CM | POA: Diagnosis not present

## 2019-07-22 DIAGNOSIS — N401 Enlarged prostate with lower urinary tract symptoms: Secondary | ICD-10-CM | POA: Diagnosis not present

## 2019-07-23 ENCOUNTER — Encounter (HOSPITAL_COMMUNITY): Payer: Self-pay | Admitting: Emergency Medicine

## 2019-07-23 ENCOUNTER — Emergency Department (HOSPITAL_COMMUNITY)
Admission: EM | Admit: 2019-07-23 | Discharge: 2019-07-23 | Disposition: A | Discharge status: Home / Self Care | Payer: Medicare Other | Attending: Emergency Medicine | Admitting: Emergency Medicine

## 2019-07-23 DIAGNOSIS — N390 Urinary tract infection, site not specified: Secondary | ICD-10-CM | POA: Diagnosis not present

## 2019-07-23 DIAGNOSIS — Z87891 Personal history of nicotine dependence: Secondary | ICD-10-CM | POA: Insufficient documentation

## 2019-07-23 DIAGNOSIS — R339 Retention of urine, unspecified: Secondary | ICD-10-CM | POA: Diagnosis not present

## 2019-07-23 LAB — URINALYSIS, ROUTINE W REFLEX MICROSCOPIC
Bilirubin Urine: NEGATIVE
Glucose, UA: NEGATIVE mg/dL
Ketones, ur: NEGATIVE mg/dL
Nitrite: NEGATIVE
Protein, ur: NEGATIVE mg/dL
Specific Gravity, Urine: 1.006 (ref 1.005–1.030)
WBC, UA: >50 WBC/hpf — ABNORMAL HIGH (ref 0–5)
pH: 7 (ref 5.0–8.0)

## 2019-07-23 MED ORDER — LIDOCAINE HCL URETHRAL/MUCOSAL 2 % EX GEL
1.0000 "application " | Freq: Once | CUTANEOUS | Status: DC | PRN
  Filled 2019-07-23: qty 30

## 2019-07-23 MED ORDER — CEPHALEXIN 500 MG PO CAPS: 500.0000 mg | ORAL_CAPSULE | Freq: Three times a day (TID) | ORAL | 0 refills | Status: DC

## 2019-07-23 MED ORDER — CEPHALEXIN 500 MG PO CAPS
500.0000 mg | ORAL_CAPSULE | Freq: Once | ORAL | Status: DC
  Filled 2019-07-23: qty 1

## 2019-07-23 NOTE — ED Provider Notes (Addendum)
TIME SEEN: 5:34 AM  CHIEF COMPLAINT: urinary retention  HPI: Patient is a 85 y.o. M with h/o BPH and urinary retention, hard of hearing who presents to ED with his wife with complaints of abdominal discomfort and distention and urinary retention.  Last urinated around 8 PM last night.  Had a Foley catheter removed by [facility] yesterday.  Has had issues with urinary retention many times in the past.  No fevers, vomiting or diarrhea.  Patient underwent Cystoscopy with implantation of UroLift devices, 6 implants with urology on 07/01/2019 secondary to bladder outlet obstruction from BPH.   ROS: See HPI Constitutional: no fever  Eyes: no drainage  ENT: no runny nose   Cardiovascular:  no chest pain  Resp: no SOB  GI: no vomiting GU: no dysuria Integumentary: no rash  Allergy: no hives  Musculoskeletal: no leg swelling  Neurological: no slurred speech ROS otherwise negative  PAST MEDICAL HISTORY/PAST SURGICAL HISTORY:  Past Medical History:  Diagnosis Date  . HOH (hard of hearing)   . No pertinent past medical history     MEDICATIONS:  Prior to Admission medications   Medication Sig Start Date End Date Taking? Authorizing Provider  cephALEXin (KEFLEX) 500 MG capsule Take 1 capsule (500 mg total) by mouth 3 (three) times daily. 05/29/19   Ripley Fraise, MD  ipratropium-albuterol (DUONEB) 0.5-2.5 (3) MG/3ML SOLN Inhale 3 ml vial three times a day in nebulizer 02/03/19   [provider]  meloxicam (MOBIC) 15 MG tablet Take 15 mg by mouth daily.    [provider]  tamsulosin (FLOMAX) 0.4 MG CAPS capsule Take 0.4 mg by mouth.    [provider]    ALLERGIES:  No Known Allergies  SOCIAL HISTORY:  Social History   Tobacco Use  . Smoking status: Former Research scientist (life sciences)  . Smokeless tobacco: Never Used  . Tobacco comment: Hasn't smoked in 40 years  Substance Use Topics  . Alcohol use: Not Currently    FAMILY HISTORY: No family history on  file.  EXAM: BP (!) 142/75 (BP Location: Left Arm)   Pulse 89   Temp 98 F (36.7 C) (Oral)   Resp (!) 22   SpO2 98%  CONSTITUTIONAL: Alert and oriented and responds appropriately to questions.  Elderly, very hard of hearing HEAD: Normocephalic EYES: Conjunctivae clear, pupils appear equal, EOMI ENT: normal nose; moist mucous membranes NECK: Supple, no meningismus, no nuchal rigidity, no LAD  CARD: RRR; S1 and S2 appreciated; no murmurs, no clicks, no rubs, no gallops RESP: Normal chest excursion without splinting or tachypnea; breath sounds clear and equal bilaterally; no wheezes, no rhonchi, no rales, no hypoxia or respiratory distress, speaking full sentences ABD/GI: Normal bowel sounds; distended in the lower abdomen, soft, tender in the lower abdomen, no redness or warmth, no ecchymosis BACK:  The back appears normal and is non-tender to palpation, there is no CVA tenderness EXT: Normal ROM in all joints; non-tender to palpation; no edema; normal capillary refill; no cyanosis, no calf tenderness or swelling    SKIN: Normal color for age and race; warm; no rash NEURO: Moves all extremities equally PSYCH: The patient's mood and manner are appropriate. Grooming and personal hygiene are appropriate.  MEDICAL DECISION MAKING: Patient here with urinary retention.  Will place Foley catheter.  Will send urinalysis and culture.  Will have patient follow-up with urology as an outpatient.  ED PROGRESS: Patient feels better after Foley catheter placed.  He has had approximately 800 mL of urine output.  Urine appears possibly infected but he did just have a Foley catheter removed.  Urine culture is pending.  Previous urine culture has grown Enterobacter that was sensitive to cephalosporins.  Will discharge on Keflex and have patient follow-up with urology.  Patient's wife reports patient is already on Bactrim.  Enterobacter was previously sensitive to Bactrim.  Will cancel Keflex orders and  prescription.   At this time, I do not feel there is any life-threatening condition present. I have reviewed and discussed all results (EKG, imaging, lab, urine as appropriate) and exam findings with patient/family. I have reviewed nursing notes and appropriate previous records.  I feel the patient is safe to be discharged home without further emergent workup and can continue workup as an outpatient as needed. Discussed usual and customary return precautions. Patient/family verbalize understanding and are comfortable with this plan.  Outpatient follow-up has been provided as needed. All questions have been answered.      Sol Odor, Delice Bison, DO 07/23/19 0612    Dann Ventress, Delice Bison, DO 07/23/19 [phone]

## 2019-07-23 NOTE — ED Notes (Signed)
Urine collection bag changed to leg bag.

## 2019-07-23 NOTE — ED Triage Notes (Addendum)
Patient here from home with complaints of urinary retention. Unable to go since 9pm last night. Pelvic/ abd pain . States that he had a Urolift and had catheter removed. States "Im tired of this happening".

## 2019-07-25 LAB — URINE CULTURE: Culture: 30000 — AB

## 2019-07-26 ENCOUNTER — Telehealth: Payer: Self-pay | Admitting: Emergency Medicine

## 2019-07-26 NOTE — Telephone Encounter (Signed)
Post ED Visit - Positive Culture Follow-up  Culture report reviewed by antimicrobial stewardship pharmacist: [place] Team []  Nathan Batchelder, Pharm.D. []  [place], Pharm.D., BCPS AQ-ID []  Heide Guile, Pharm.D., BCPS []  [place]ks Neptune, Pharm.D., BCPS []  Tab, Pharm.D., BCPS, AAHIVP []  [place], Pharm.D., BCPS, AAHIVP []  Legrand Como, PharmD, BCPS []  Salome Arnt, PharmD, BCPS []  Johnnette Gourd, PharmD, BCPS []  [place]hes Better, PharmD []  Leeroy Cha, PharmD, BCPS []  Laqueta Linden, PharmD  [place] Team []  [address], PharmD []  Leodis Sias, PharmD []  Lindell Spar, PharmD []  Royetta Asal, Rph []  Graylin Shiver) Rema Fendt, PharmD []  Glennon Mac, PharmD []  Arlyn Dunning, PharmD []  Netta Cedars, PharmD []  Dia Sitter, PharmD []  Leone Haven, PharmD []  Gretta Arab, PharmD []  Theodis Shove, PharmD [x]  Peggyann Juba, PharmD   Positive urine culture Treated with Bactrim, organism sensitive to the same and no further patient follow-up is required at this time.  Donald Ellis 07/26/2019, 4:08 PM

## 2019-08-05 DIAGNOSIS — R338 Other retention of urine: Secondary | ICD-10-CM | POA: Diagnosis not present

## 2019-08-09 ENCOUNTER — Other Ambulatory Visit: Payer: Self-pay | Admitting: Urology

## 2019-08-26 ENCOUNTER — Encounter (HOSPITAL_BASED_OUTPATIENT_CLINIC_OR_DEPARTMENT_OTHER): Payer: Self-pay | Admitting: *Deleted

## 2019-08-26 ENCOUNTER — Other Ambulatory Visit (HOSPITAL_COMMUNITY)
Admission: RE | Admit: 2019-08-26 | Discharge: 2019-08-26 | Disposition: A | Discharge status: Home / Self Care | Payer: Medicare Other | Source: Ambulatory Visit | Attending: Urology | Admitting: Urology

## 2019-08-26 ENCOUNTER — Other Ambulatory Visit: Payer: Self-pay

## 2019-08-26 DIAGNOSIS — Z20828 Contact with and (suspected) exposure to other viral communicable diseases: Secondary | ICD-10-CM | POA: Diagnosis not present

## 2019-08-26 DIAGNOSIS — Z01812 Encounter for preprocedural laboratory examination: Secondary | ICD-10-CM | POA: Diagnosis not present

## 2019-08-26 NOTE — Progress Notes (Signed)
SPOKE WITH WIFE MARY, NPO AFTER MIDNIGHT FOOD, CLEAR LIQUIDS UNTIL 730 AM , THEN NPO ARRIVE 1130 AM 08-30-19 [place]. DAUGHTER IS DRIVER  HOME( NOT SURE WHICH DAUGHTER YET) . GAVE OVERNIGHT STAY INSTRUCTIONS INCLUDING BRING ALL PRESCRIPTION MEDICATIONS IN ORIGINAL CONTAINERS.  MEDS TO TAKE : DUONEB AND TAMSULOSIN. HAS SURGERY ORDERS IN Epic.  NO LABS NEEDED.

## 2019-08-27 LAB — NOVEL CORONAVIRUS, NAA (HOSP ORDER, SEND-OUT TO REF LAB; TAT 18-24 HRS): SARS-CoV-2, NAA: NOT DETECTED

## 2019-08-30 ENCOUNTER — Ambulatory Visit (HOSPITAL_BASED_OUTPATIENT_CLINIC_OR_DEPARTMENT_OTHER): Payer: Medicare Other | Admitting: Anesthesiology

## 2019-08-30 ENCOUNTER — Encounter (HOSPITAL_BASED_OUTPATIENT_CLINIC_OR_DEPARTMENT_OTHER)
Admission: RE | Disposition: A | Discharge status: Home / Self Care | Payer: Self-pay | Source: Home / Self Care | Attending: Urology

## 2019-08-30 ENCOUNTER — Observation Stay (HOSPITAL_BASED_OUTPATIENT_CLINIC_OR_DEPARTMENT_OTHER)
Admission: RE | Admit: 2019-08-30 | Discharge: 2019-08-31 | Disposition: A | Discharge status: Home / Self Care | Payer: Medicare Other | Attending: Urology | Admitting: Urology

## 2019-08-30 ENCOUNTER — Encounter (HOSPITAL_BASED_OUTPATIENT_CLINIC_OR_DEPARTMENT_OTHER): Payer: Self-pay

## 2019-08-30 DIAGNOSIS — H9190 Unspecified hearing loss, unspecified ear: Secondary | ICD-10-CM | POA: Insufficient documentation

## 2019-08-30 DIAGNOSIS — N401 Enlarged prostate with lower urinary tract symptoms: Secondary | ICD-10-CM | POA: Diagnosis not present

## 2019-08-30 DIAGNOSIS — Z87891 Personal history of nicotine dependence: Secondary | ICD-10-CM | POA: Diagnosis not present

## 2019-08-30 DIAGNOSIS — R338 Other retention of urine: Secondary | ICD-10-CM | POA: Diagnosis not present

## 2019-08-30 DIAGNOSIS — N319 Neuromuscular dysfunction of bladder, unspecified: Secondary | ICD-10-CM | POA: Diagnosis not present

## 2019-08-30 DIAGNOSIS — C61 Malignant neoplasm of prostate: Secondary | ICD-10-CM | POA: Diagnosis not present

## 2019-08-30 DIAGNOSIS — N400 Enlarged prostate without lower urinary tract symptoms: Secondary | ICD-10-CM | POA: Diagnosis present

## 2019-08-30 DIAGNOSIS — N138 Other obstructive and reflux uropathy: Secondary | ICD-10-CM

## 2019-08-30 HISTORY — PX: TRANSURETHRAL RESECTION OF PROSTATE: SHX73

## 2019-08-30 HISTORY — DX: Presence of other specified devices: Z97.8

## 2019-08-30 HISTORY — DX: Retention of urine, unspecified: R33.9

## 2019-08-30 LAB — BASIC METABOLIC PANEL
Anion gap: 7 (ref 5–15)
BUN: 16 mg/dL (ref 8–23)
CO2: 28 mmol/L (ref 22–32)
Calcium: 8.7 mg/dL — ABNORMAL LOW (ref 8.9–10.3)
Chloride: 104 mmol/L (ref 98–111)
Creatinine, Ser: 0.66 mg/dL (ref 0.61–1.24)
GFR calc Af Amer: >60 mL/min (ref >60)
GFR calc non Af Amer: >60 mL/min (ref >60)
Glucose, Bld: 100 mg/dL — ABNORMAL HIGH (ref 70–99)
Potassium: 4.4 mmol/L (ref 3.5–5.1)
Sodium: 139 mmol/L (ref 135–145)

## 2019-08-30 LAB — CBC
HCT: 40.8 % (ref 39.0–52.0)
Hemoglobin: 13.2 g/dL (ref 13.0–17.0)
MCH: 32.8 pg (ref 26.0–34.0)
MCHC: 32.4 g/dL (ref 30.0–36.0)
MCV: 101.2 fL — ABNORMAL HIGH (ref 80.0–100.0)
Platelets: 172 10*3/uL (ref 150–400)
RBC: 4.03 MIL/uL — ABNORMAL LOW (ref 4.22–5.81)
RDW: 15.2 % (ref 11.5–15.5)
WBC: 7.2 10*3/uL (ref 4.0–10.5)
nRBC: 0 % (ref 0.0–0.2)

## 2019-08-30 SURGERY — TURP (TRANSURETHRAL RESECTION OF PROSTATE)
Anesthesia: General | Site: Prostate

## 2019-08-30 MED ORDER — LIDOCAINE 2% (20 MG/ML) 5 ML SYRINGE
INTRAMUSCULAR | Status: DC | PRN
  Administered 2019-08-30: 75 mg via INTRAVENOUS

## 2019-08-30 MED ORDER — CEFTRIAXONE SODIUM 2 G IJ SOLR
INTRAMUSCULAR | Status: AC
  Filled 2019-08-30: qty 20

## 2019-08-30 MED ORDER — DIPHENHYDRAMINE HCL 50 MG/ML IJ SOLN
12.5000 mg | Freq: Four times a day (QID) | INTRAMUSCULAR | Status: DC | PRN
  Filled 2019-08-30: qty 0.25

## 2019-08-30 MED ORDER — PROPOFOL 10 MG/ML IV BOLUS
INTRAVENOUS | Status: AC
  Filled 2019-08-30: qty 20

## 2019-08-30 MED ORDER — OXYCODONE-ACETAMINOPHEN 5-325 MG PO TABS
1.0000 | ORAL_TABLET | ORAL | Status: DC | PRN
  Administered 2019-08-30 – 2019-08-31 (×3): 1 via ORAL
  Filled 2019-08-30: qty 2

## 2019-08-30 MED ORDER — SODIUM CHLORIDE 0.9 % IR SOLN
3000.0000 mL | Status: DC
  Administered 2019-08-30 – 2019-08-31 (×3): 3000 mL
  Filled 2019-08-30: qty 3000

## 2019-08-30 MED ORDER — SODIUM CHLORIDE 0.9 % IV SOLN
INTRAVENOUS | Status: DC
  Administered 2019-08-30: 12:00:00 via INTRAVENOUS
  Filled 2019-08-30: qty 1000

## 2019-08-30 MED ORDER — ZOLPIDEM TARTRATE 5 MG PO TABS
5.0000 mg | ORAL_TABLET | Freq: Every evening | ORAL | Status: DC | PRN
  Filled 2019-08-30: qty 1

## 2019-08-30 MED ORDER — SODIUM CHLORIDE 0.9 % IR SOLN
Status: DC | PRN
  Administered 2019-08-30 (×5): 3000 mL via INTRAVESICAL
  Administered 2019-08-30: 6000 mL via INTRAVESICAL
  Administered 2019-08-30 (×4): 3000 mL via INTRAVESICAL

## 2019-08-30 MED ORDER — OXYCODONE HCL 5 MG PO TABS
5.0000 mg | ORAL_TABLET | Freq: Once | ORAL | Status: DC | PRN
  Filled 2019-08-30: qty 1

## 2019-08-30 MED ORDER — ONDANSETRON HCL 4 MG/2ML IJ SOLN
INTRAMUSCULAR | Status: AC
  Filled 2019-08-30: qty 2

## 2019-08-30 MED ORDER — DIPHENHYDRAMINE HCL 12.5 MG/5ML PO ELIX
12.5000 mg | ORAL_SOLUTION | Freq: Four times a day (QID) | ORAL | Status: DC | PRN
  Filled 2019-08-30: qty 5

## 2019-08-30 MED ORDER — HYDROMORPHONE HCL 1 MG/ML IJ SOLN
0.2500 mg | INTRAMUSCULAR | Status: DC | PRN
  Administered 2019-08-30 (×2): 0.25 mg via INTRAVENOUS
  Administered 2019-08-30: 0.5 mg via INTRAVENOUS
  Filled 2019-08-30: qty 0.5

## 2019-08-30 MED ORDER — BELLADONNA ALKALOIDS-OPIUM 16.2-60 MG RE SUPP
1.0000 | Freq: Four times a day (QID) | RECTAL | Status: DC | PRN
  Filled 2019-08-30: qty 1

## 2019-08-30 MED ORDER — OXYCODONE HCL 5 MG/5ML PO SOLN
5.0000 mg | Freq: Once | ORAL | Status: DC | PRN
  Filled 2019-08-30: qty 5

## 2019-08-30 MED ORDER — SODIUM CHLORIDE 0.9 % IV SOLN
2.0000 g | INTRAVENOUS | Status: AC
  Administered 2019-08-30: 2 g via INTRAVENOUS
  Filled 2019-08-30: qty 20

## 2019-08-30 MED ORDER — FENTANYL CITRATE (PF) 100 MCG/2ML IJ SOLN
INTRAMUSCULAR | Status: AC
  Filled 2019-08-30: qty 2

## 2019-08-30 MED ORDER — SODIUM CHLORIDE 0.9 % IV SOLN
INTRAVENOUS | Status: DC
  Administered 2019-08-30: 18:00:00 75 mL/h via INTRAVENOUS
  Filled 2019-08-30 (×2): qty 1000

## 2019-08-30 MED ORDER — DEXAMETHASONE SODIUM PHOSPHATE 10 MG/ML IJ SOLN
INTRAMUSCULAR | Status: AC
  Filled 2019-08-30: qty 1

## 2019-08-30 MED ORDER — LIDOCAINE 2% (20 MG/ML) 5 ML SYRINGE
INTRAMUSCULAR | Status: AC
  Filled 2019-08-30: qty 5

## 2019-08-30 MED ORDER — PROPOFOL 10 MG/ML IV BOLUS
INTRAVENOUS | Status: DC | PRN
  Administered 2019-08-30: 20 mg via INTRAVENOUS
  Administered 2019-08-30: 150 mg via INTRAVENOUS

## 2019-08-30 MED ORDER — IPRATROPIUM-ALBUTEROL 0.5-2.5 (3) MG/3ML IN SOLN
3.0000 mL | Freq: Three times a day (TID) | RESPIRATORY_TRACT | Status: DC
  Administered 2019-08-30 (×2): 3 mL via RESPIRATORY_TRACT
  Filled 2019-08-30: qty 3
  Filled 2019-08-30: qty 9

## 2019-08-30 MED ORDER — SODIUM CHLORIDE 0.9 % IV SOLN
INTRAVENOUS | Status: AC
  Filled 2019-08-30: qty 100

## 2019-08-30 MED ORDER — HYDROMORPHONE HCL 1 MG/ML IJ SOLN
0.5000 mg | INTRAMUSCULAR | Status: DC | PRN
  Filled 2019-08-30: qty 1

## 2019-08-30 MED ORDER — FENTANYL CITRATE (PF) 100 MCG/2ML IJ SOLN
INTRAMUSCULAR | Status: DC | PRN
  Administered 2019-08-30 (×2): 25 ug via INTRAVENOUS
  Administered 2019-08-30: 50 ug via INTRAVENOUS
  Administered 2019-08-30 (×4): 25 ug via INTRAVENOUS

## 2019-08-30 MED ORDER — ONDANSETRON HCL 4 MG/2ML IJ SOLN
INTRAMUSCULAR | Status: DC | PRN
  Administered 2019-08-30: 4 mg via INTRAVENOUS

## 2019-08-30 MED ORDER — ONDANSETRON HCL 4 MG/2ML IJ SOLN
4.0000 mg | INTRAMUSCULAR | Status: DC | PRN
  Filled 2019-08-30: qty 2

## 2019-08-30 MED ORDER — PROMETHAZINE HCL 25 MG/ML IJ SOLN
6.2500 mg | INTRAMUSCULAR | Status: DC | PRN
  Filled 2019-08-30: qty 1

## 2019-08-30 MED ORDER — HYDROMORPHONE HCL 1 MG/ML IJ SOLN
INTRAMUSCULAR | Status: AC
  Filled 2019-08-30: qty 1

## 2019-08-30 MED ORDER — OXYCODONE-ACETAMINOPHEN 5-325 MG PO TABS
ORAL_TABLET | ORAL | Status: AC
  Filled 2019-08-30: qty 1

## 2019-08-30 MED ORDER — DEXAMETHASONE SODIUM PHOSPHATE 10 MG/ML IJ SOLN
INTRAMUSCULAR | Status: DC | PRN
  Administered 2019-08-30: 10 mg via INTRAVENOUS

## 2019-08-30 SURGICAL SUPPLY — 22 items
BAG DRAIN URO-CYSTO SKYTR STRL (DRAIN) ×3 IMPLANT
BAG URINE DRAINAGE (UROLOGICAL SUPPLIES) ×3 IMPLANT
BAG URINE LEG 500ML (DRAIN) IMPLANT
CATH FOLEY 3WAY 30CC 22F (CATHETERS) ×3 IMPLANT
CATH FOLEY 3WAY 30CC 22FR (CATHETERS) ×2 IMPLANT
CLOTH BEACON ORANGE TIMEOUT ST (SAFETY) ×3 IMPLANT
ELECT REM PT RETURN 9FT ADLT (ELECTROSURGICAL)
ELECTRODE REM PT RTRN 9FT ADLT (ELECTROSURGICAL) IMPLANT
GLOVE BIO SURGEON STRL SZ8 (GLOVE) ×3 IMPLANT
GOWN STRL REUS W/TWL XL LVL3 (GOWN DISPOSABLE) ×3 IMPLANT
HOLDER FOLEY CATH W/STRAP (MISCELLANEOUS) ×3 IMPLANT
IV NS IRRIG 3000ML ARTHROMATIC (IV SOLUTION) ×22 IMPLANT
KIT TURNOVER CYSTO (KITS) ×3 IMPLANT
LOOP CUT BIPOLAR 24F LRG (ELECTROSURGICAL) ×2 IMPLANT
MANIFOLD NEPTUNE II (INSTRUMENTS) ×3 IMPLANT
PACK CYSTO (CUSTOM PROCEDURE TRAY) ×3 IMPLANT
PLUG CATH AND CAP STER (CATHETERS) ×3 IMPLANT
SYR 30ML LL (SYRINGE) ×3 IMPLANT
SYRINGE IRR TOOMEY STRL 70CC (SYRINGE) ×2 IMPLANT
TUBE CONNECTING 12'X1/4 (SUCTIONS) ×1
TUBE CONNECTING 12X1/4 (SUCTIONS) ×1 IMPLANT
TUBING UROLOGY SET (TUBING) ×3 IMPLANT

## 2019-08-30 NOTE — Anesthesia Postprocedure Evaluation (Signed)
Anesthesia Post Note  Patient: Donald Ellis  Procedure(s) Performed: TRANSURETHRAL RESECTION OF THE PROSTATE (TURP) (N/A Prostate)     Patient location during evaluation: PACU Anesthesia Type: General Level of consciousness: sedated and patient cooperative Pain management: pain level controlled Vital Signs Assessment: post-procedure vital signs reviewed and stable Respiratory status: spontaneous breathing Cardiovascular status: stable Anesthetic complications: no    Last Vitals:  Vitals:   08/30/19 1515 08/30/19 1525  BP: 140/84 (!) 151/81  Pulse: 75 81  Resp: 10 16  Temp:  (!) 36.4 C  SpO2: 98% 96%    Last Pain:  Vitals:   08/30/19 1445  TempSrc:   PainSc: Asleep                 Nolon Nations

## 2019-08-30 NOTE — H&P (Signed)
Urology Admission H&P  Chief Complaint: Urinary retention  History of Present Illness: Donald Ellis is a 85yo with a hx of BPH with urinary retention who currently has an indwelling foley. He has failed Urolift. He has severe LUTS that are refractory to medical therapy  Past Medical History:  Diagnosis Date  . Foley catheter in place    LAST CHANGED 07-04-2001  . HOH (hard of hearing)   . No pertinent past medical history   . Urinary retention    Past Surgical History:  Procedure Laterality Date  . CYSTOSCOPY WITH INSERTION OF UROLIFT N/A 07/01/2019   Procedure: CYSTOSCOPY WITH INSERTION OF UROLIFT;  Surgeon: Cleon Gustin, MD;  Location: [facility];  Service: Urology;  Laterality: N/A;    Home Medications:  Current Facility-Administered Medications  Medication Dose Route Frequency Provider Last Rate Last Dose  . 0.9 %  sodium chloride infusion   Intravenous Continuous Lyn Hollingshead, MD 50 mL/hr at 08/30/19 1149    . cefTRIAXone (ROCEPHIN) 2 g in sodium chloride 0.9 % 100 mL IVPB  2 g Intravenous 30 min Pre-Op Ayyub Krall, Candee Furbish, MD       Allergies: No Known Allergies  History reviewed. No pertinent family history. Social History:  reports that he has quit smoking. He has never used smokeless tobacco. He reports previous alcohol use. He reports that he does not use drugs.  Review of Systems  Genitourinary: Positive for frequency and urgency.  All other systems reviewed and are negative.   Physical Exam:  Vital signs in last 24 hours: Temp:  [97.3 F (36.3 C)] 97.3 F (36.3 C) (09/21 1141) Pulse Rate:  [72] 72 (09/21 1141) Resp:  [16] 16 (09/21 1141) BP: (174)/(88) 174/88 (09/21 1141) SpO2:  [99 %] 99 % (09/21 1141) Weight:  [68.5 kg] 68.5 kg (09/21 1141) Physical Exam  Constitutional: He is oriented to person, place, and time. He appears well-developed and well-nourished.  HENT:  Head: Normocephalic and atraumatic.  Eyes: Pupils are equal,  round, and reactive to light. EOM are normal.  Neck: Normal range of motion. No thyromegaly present.  Cardiovascular: Normal rate and regular rhythm.  Respiratory: Effort normal. No respiratory distress.  GI: Soft. He exhibits no distension.  Musculoskeletal: Normal range of motion.        General: No edema.  Neurological: He is alert and oriented to person, place, and time.  Skin: Skin is warm and dry.  Psychiatric: He has a normal mood and affect. His behavior is normal. Judgment and thought content normal.    Laboratory Data:  No results found for this or any previous visit (from the past 24 hour(s)). Recent Results (from the past 240 hour(s))  Novel Coronavirus, NAA (Hosp order, Send-out to Ref Lab; TAT 18-24 hrs     Status: None   Collection Time: 08/26/19  8:27 AM   Specimen: Nasopharyngeal Swab; Respiratory  Result Value Ref Range Status   SARS-CoV-2, NAA NOT DETECTED NOT DETECTED Final    Comment: (NOTE) This nucleic acid amplification test was developed and its performance characteristics determined by [employer]. Nucleic acid amplification tests include PCR and TMA. This test has not been FDA cleared or approved. This test has been authorized by FDA under an Emergency Use Authorization (EUA). This test is only authorized for the duration of time the declaration that circumstances exist justifying the authorization of the emergency use of in vitro diagnostic tests for detection of SARS-CoV-2 virus and/or diagnosis of COVID-19 infection under  section 564(b)(1) of the Act, 21 U.S.C. [id number]) (1), unless the authorization is terminated or revoked sooner. When diagnostic testing is negative, the possibility of a false negative result should be considered in the context of a patient's recent exposures and the presence of clinical signs and symptoms consistent with COVID-19. An individual without symptoms of COVID- 19 and who is not shedding SARS-CoV-2 vi rus  would expect to have a negative (not detected) result in this assay. Performed At: [facility] [address] [place], [place] [id number] Rush Farmer MD [id number]    [place]  Final    Comment: Performed at [facility], [place] [address], [place], [place] [address]   Creatinine: No results for input(s): CREATININE in the last 168 hours. Baseline Creatinine: unknown  Impression/Assessment:  85yo with BPH with LUTS, urinary retention  Plan:  The risks/benefits/alternatives to TURP was explained to he patient and he understands and wishes to proceed with surgery  Donald Ellis 08/30/2019, 12:15 PM

## 2019-08-30 NOTE — Op Note (Signed)
Preoperative diagnosis: BPH  Postoperative diagnosis: BPH  Procedure: 1 cystoscopy 2. Transurethral resection of the prostate  Attending: [name]  Anesthesia: General  Estimated blood loss: Minimal  Drains: 22 French foley  Specimens: 1. Prostate Chips  Antibiotics: Rocephin  Findings: Trilobar prostate enlargement. Ureteral orifices in normal anatomic location.   Indications: Patient is a 85 year old male with a history of BPH and elevated PVR.  After discussing treatment options, they decided proceed with transurethral resection of the prostate.  Procedure her in detail: The patient was brought to the operating room and a brief timeout was done to ensure correct patient, correct procedure, correct site.  General anesthesia was administered patient was placed in dorsal lithotomy position.  Their genitalia was then prepped and draped in usual sterile fashion.  A rigid 108 French cystoscope was passed in the urethra and the bladder.  Bladder was inspected and we noted no masses or lesions.  the ureteral orifices were in the normal orthotopic locations. removed the cystoscope and placed a resectoscope into the bladder. We then turned our attention to the prostate resection. Using the bipolar resectoscope we resected the median lobe first from the bladder neck to the verumontanum. We then started at the 12 oclock position on the left lobe and resection to the 6 o'clock position from the bladder neck to the verumontanum. We then did the same resection of the right lobe. Once the resection was complete we then cauterized individual bleeders. We then removed the prostate chips and sent them for pathology.  We then re-inspected the prostatic fossa and found no residual bleeding.  the bladder was then drained, a 22 French foley was placed and this concluded the procedure which was well tolerated by patient.  Complications: None  Condition: Stable, extubated, transferred to PACU  Plan:  Patient is admitted overnight with continuous bladder irrigation. If their urine is clear tomorrow they will be discharged home and followup in 5 days for foley catheter removal and pathology discussion.

## 2019-08-30 NOTE — Anesthesia Preprocedure Evaluation (Signed)
Anesthesia Evaluation  Patient identified by MRN, date of birth, ID band Patient awake    Reviewed: Allergy & Precautions, NPO status , Patient's Chart, lab work & pertinent test results  Airway Mallampati: I  TM Distance: >3 FB Neck ROM: Full    Dental no notable dental hx.    Pulmonary former smoker,    Pulmonary exam normal breath sounds clear to auscultation       Cardiovascular negative cardio ROS Normal cardiovascular exam Rhythm:Regular Rate:Normal     Neuro/Psych negative neurological ROS  negative psych ROS   GI/Hepatic negative GI ROS, Neg liver ROS,   Endo/Other  negative endocrine ROS  Renal/GU negative Renal ROS Bladder dysfunction      Musculoskeletal negative musculoskeletal ROS (+)   Abdominal Normal abdominal exam  (+)   Peds  Hematology negative hematology ROS (+)   Anesthesia Other Findings   Reproductive/Obstetrics                             Anesthesia Physical  Anesthesia Plan  ASA: II  Anesthesia Plan: General   Post-op Pain Management:    Induction: Intravenous  PONV Risk Score and Plan: 2 and Treatment may vary due to age or medical condition, Ondansetron and Midazolam  Airway Management Planned: LMA  Additional Equipment:   Intra-op Plan:   Post-operative Plan: Extubation in OR  Informed Consent: I have reviewed the patients History and Physical, chart, labs and discussed the procedure including the risks, benefits and alternatives for the proposed anesthesia with the patient or authorized representative who has indicated his/her understanding and acceptance.     Dental advisory given  Plan Discussed with: CRNA  Anesthesia Plan Comments:         Anesthesia Quick Evaluation

## 2019-08-30 NOTE — Transfer of Care (Signed)
Immediate Anesthesia Transfer of Care Note  Patient: Donald Ellis  Procedure(s) Performed: TRANSURETHRAL RESECTION OF THE PROSTATE (TURP) (N/A Prostate)  Patient Location: PACU  Anesthesia Type:General  Level of Consciousness: awake, drowsy and responds to stimulation  Airway & Oxygen Therapy: Patient Spontanous Breathing and Patient connected to nasal cannula oxygen  Post-op Assessment: Report given to RN and Post -op Vital signs reviewed and stable  Post vital signs: Reviewed and stable  Last Vitals:  Vitals Value Taken Time  BP 122/72 08/30/19 1430  Temp 36.8 C 08/30/19 1429  Pulse 68 08/30/19 1431  Resp 7 08/30/19 1431  SpO2 96 % 08/30/19 1431  Vitals shown include unvalidated device data.  Last Pain:  Vitals:   08/30/19 1141  TempSrc: Oral  PainSc: 0-No pain      Patients Stated Pain Goal: 4 ([id number])  Complications: No apparent anesthesia complications

## 2019-08-30 NOTE — Anesthesia Procedure Notes (Signed)
Procedure Name: LMA Insertion Date/Time: 08/30/2019 12:46 PM Performed by: Lollie Sails, CRNA Pre-anesthesia Checklist: Patient identified, Emergency Drugs available, Suction available, Patient being monitored and Timeout performed Patient Re-evaluated:Patient Re-evaluated prior to induction Oxygen Delivery Method: Circle system utilized Preoxygenation: Pre-oxygenation with 100% oxygen Induction Type: IV induction Ventilation: Mask ventilation without difficulty LMA: LMA inserted LMA Size: 5.0 Number of attempts: 1 Placement Confirmation: positive ETCO2 and breath sounds checked- equal and bilateral Tube secured with: Tape Dental Injury: Teeth and Oropharynx as per pre-operative assessment

## 2019-08-31 ENCOUNTER — Encounter (HOSPITAL_BASED_OUTPATIENT_CLINIC_OR_DEPARTMENT_OTHER): Payer: Self-pay | Admitting: Urology

## 2019-08-31 DIAGNOSIS — Z87891 Personal history of nicotine dependence: Secondary | ICD-10-CM | POA: Diagnosis not present

## 2019-08-31 DIAGNOSIS — H9190 Unspecified hearing loss, unspecified ear: Secondary | ICD-10-CM | POA: Diagnosis not present

## 2019-08-31 DIAGNOSIS — N401 Enlarged prostate with lower urinary tract symptoms: Secondary | ICD-10-CM | POA: Diagnosis not present

## 2019-08-31 DIAGNOSIS — R338 Other retention of urine: Secondary | ICD-10-CM | POA: Diagnosis not present

## 2019-08-31 DIAGNOSIS — C61 Malignant neoplasm of prostate: Secondary | ICD-10-CM | POA: Diagnosis not present

## 2019-08-31 LAB — BASIC METABOLIC PANEL
Anion gap: 9 (ref 5–15)
BUN: 20 mg/dL (ref 8–23)
CO2: 27 mmol/L (ref 22–32)
Calcium: 8.6 mg/dL — ABNORMAL LOW (ref 8.9–10.3)
Chloride: 100 mmol/L (ref 98–111)
Creatinine, Ser: 0.75 mg/dL (ref 0.61–1.24)
GFR calc Af Amer: >60 mL/min (ref >60)
GFR calc non Af Amer: >60 mL/min (ref >60)
Glucose, Bld: 118 mg/dL — ABNORMAL HIGH (ref 70–99)
Potassium: 4.4 mmol/L (ref 3.5–5.1)
Sodium: 136 mmol/L (ref 135–145)

## 2019-08-31 LAB — CBC
HCT: 36.6 % — ABNORMAL LOW (ref 39.0–52.0)
Hemoglobin: 11.7 g/dL — ABNORMAL LOW (ref 13.0–17.0)
MCH: 33 pg (ref 26.0–34.0)
MCHC: 32 g/dL (ref 30.0–36.0)
MCV: 103.1 fL — ABNORMAL HIGH (ref 80.0–100.0)
Platelets: 162 10*3/uL (ref 150–400)
RBC: 3.55 MIL/uL — ABNORMAL LOW (ref 4.22–5.81)
RDW: 15.4 % (ref 11.5–15.5)
WBC: 9.9 10*3/uL (ref 4.0–10.5)
nRBC: 0 % (ref 0.0–0.2)

## 2019-08-31 MED ORDER — OXYCODONE-ACETAMINOPHEN 5-325 MG PO TABS
ORAL_TABLET | ORAL | Status: AC
  Filled 2019-08-31: qty 1

## 2019-08-31 MED ORDER — HYDROCODONE-ACETAMINOPHEN 5-325 MG PO TABS: 1.0000 | ORAL_TABLET | Freq: Four times a day (QID) | ORAL | 0 refills | Status: DC | PRN

## 2019-08-31 NOTE — Discharge Instructions (Signed)
Indwelling Urinary Catheter Care, Adult °An indwelling urinary catheter is a thin tube that is put into your bladder. The tube helps to drain pee (urine) out of your body. The tube goes in through your urethra. Your urethra is where pee comes out of your body. Your pee will come out through the catheter, then it will go into a bag (drainage bag). °Take good care of your catheter so it will work well. °How to wear your catheter and bag °Supplies needed °· Sticky tape (adhesive tape) or a leg strap. °· Alcohol wipe or soap and water (if you use tape). °· A clean towel (if you use tape). °· Large overnight bag. °· Smaller bag (leg bag). °Wearing your catheter °Attach your catheter to your leg with tape or a leg strap. °· Make sure the catheter is not pulled tight. °· If a leg strap gets wet, take it off and put on a dry strap. °· If you use tape to hold the bag on your leg: °1. Use an alcohol wipe or soap and water to wash your skin where the tape made it sticky before. °2. Use a clean towel to pat-dry that skin. °3. Use new tape to make the bag stay on your leg. °Wearing your bags °You should have been given a large overnight bag. °· You may wear the overnight bag in the day or night. °· Always have the overnight bag lower than your bladder.  Do not let the bag touch the floor. °· Before you go to sleep, put a clean plastic bag in a wastebasket. Then hang the overnight bag inside the wastebasket. °You should also have a smaller leg bag that fits under your clothes. °· Always wear the leg bag below your knee. °· Do not wear your leg bag at night. °How to care for your skin and catheter °Supplies needed °· A clean washcloth. °· Water and mild soap. °· A clean towel. °Caring for your skin and catheter ° °  ° °· Clean the skin around your catheter every day: °? Wash your hands with soap and water. °? Wet a clean washcloth in warm water and mild soap. °? Clean the skin around your urethra. °? If you are male: °? Gently  spread the folds of skin around your vagina (labia). °? With the washcloth in your other hand, wipe the inner side of your labia on each side. Wipe from front to back. °? If you are male: °? Pull back any skin that covers the end of your penis (foreskin). °? With the washcloth in your other hand, wipe your penis in small circles. Start wiping at the tip of your penis, then move away from the catheter. °? Move the foreskin back in place, if needed. °? With your free hand, hold the catheter close to where it goes into your body. °? Keep holding the catheter during cleaning so it does not get pulled out. °? With the washcloth in your other hand, clean the catheter. °? Only wipe downward on the catheter. °? Do not wipe upward toward your body. Doing this may push germs into your urethra and cause infection. °? Use a clean towel to pat-dry the catheter and the skin around it. Make sure to wipe off all soap. °? Wash your hands with soap and water. °· Shower every day. Do not take baths. °· Do not use cream, ointment, or lotion on the area where the catheter goes into your body, unless your doctor tells you   to. °· Do not use powders, sprays, or lotions on your genital area. °· Check your skin around the catheter every day for signs of infection. Check for: °? Redness, swelling, or pain. °? Fluid or blood. °? Warmth. °? Pus or a bad smell. °How to empty the bag °Supplies needed °· Rubbing alcohol. °· Gauze pad or cotton ball. °· Tape or a leg strap. °Emptying the bag °Pour the pee out of your bag when it is ?-½ full, or at least 2-3 times a day. Do this for your overnight bag and your leg bag. °1. Wash your hands with soap and water. °2. Separate (detach) the bag from your leg. °3. Hold the bag over the toilet or a clean pail. Keep the bag lower than your hips and bladder. This is so the pee (urine) does not go back into the tube. °4. Open the pour spout. It is at the bottom of the bag. °5. Empty the pee into the toilet or  pail. Do not let the pour spout touch any surface. °6. Put rubbing alcohol on a gauze pad or cotton ball. °7. Use the gauze pad or cotton ball to clean the pour spout. °8. Close the pour spout. °9. Attach the bag to your leg with tape or a leg strap. °10. Wash your hands with soap and water. °Follow instructions for cleaning the drainage bag: °· From the product maker. °· As told by your doctor. °How to change the bag °Supplies needed °· Alcohol wipes. °· A clean bag. °· Tape or a leg strap. °Changing the bag °Replace your bag when it starts to leak, smell bad, or look dirty. °1. Wash your hands with soap and water. °2. Separate the dirty bag from your leg. °3. Pinch the catheter with your fingers so that pee does not spill out. °4. Separate the catheter tube from the bag tube where these tubes connect (at the connection valve). Do not let the tubes touch any surface. °5. Clean the end of the catheter tube with an alcohol wipe. Use a different alcohol wipe to clean the end of the bag tube. °6. Connect the catheter tube to the tube of the clean bag. °7. Attach the clean bag to your leg with tape or a leg strap. Do not make the bag tight on your leg. °8. Wash your hands with soap and water. °General rules ° °· Never pull on your catheter. Never try to take it out. Doing that can hurt you. °· Always wash your hands before and after you touch your catheter or bag. Use a mild, fragrance-free soap. If you do not have soap and water, use hand sanitizer. °· Always make sure there are no twists or bends (kinks) in the catheter tube. °· Always make sure there are no leaks in the catheter or bag. °· Drink enough fluid to keep your pee pale yellow. °· Do not take baths, swim, or use a hot tub. °· If you are male, wipe from front to back after you poop (have a bowel movement). °Contact a doctor if: °· Your pee is cloudy. °· Your pee smells worse than usual. °· Your catheter gets clogged. °· Your catheter leaks. °· Your bladder  feels full. °Get help right away if: °· You have redness, swelling, or pain where the catheter goes into your body. °· You have fluid, blood, pus, or a bad smell coming from the area where the catheter goes into your body. °· Your skin feels warm where   the catheter goes into your body. °· You have a fever. °· You have pain in your: °? Belly (abdomen). °? Legs. °? Lower back. °? Bladder. °· You see blood in the catheter. °· Your pee is pink or red. °· You feel sick to your stomach (nauseous). °· You throw up (vomit). °· You have chills. °· Your pee is not draining into the bag. °· Your catheter gets pulled out. °Summary °· An indwelling urinary catheter is a thin tube that is placed into the bladder to help drain pee (urine) out of the body. °· The catheter is placed into the part of the body that drains pee from the bladder (urethra). °· Taking good care of your catheter will keep it working properly and help prevent problems. °· Always wash your hands before and after touching your catheter or bag. °· Never pull on your catheter or try to take it out. °This information is not intended to replace advice given to you by your health care provider. Make sure you discuss any questions you have with your health care provider. °Document Released: 03/22/2013 Document Revised: 03/19/2019 Document Reviewed: 07/11/2017 °Elsevier Patient Education © 2020 [employer] ° °

## 2019-08-31 NOTE — Discharge Summary (Signed)
Physician Discharge Summary  Patient ID: Donald Ellis MRN: [id number] DOB/AGE: Dec 05, 1934 85 y.o.  Admit date: 08/30/2019 Discharge date: 08/31/2019  Admission Diagnoses:  BPH  Discharge Diagnoses:  Active Problems:   BPH (benign prostatic hyperplasia)   Past Medical History:  Diagnosis Date  . Foley catheter in place    LAST CHANGED 07-04-2001  . HOH (hard of hearing)   . No pertinent past medical history   . Urinary retention     Surgeries: Procedure(s): TRANSURETHRAL RESECTION OF THE PROSTATE (TURP) on 08/30/2019   Consultants (if any):   Discharged Condition: Improved  Hospital Course: Donald Ellis is an 85 y.o. male who was admitted 08/30/2019 with a diagnosis of BPH and went to the operating room on 08/30/2019 and underwent the above named procedures.  His urine was clear POD#1 and he was discharged home  He was given perioperative antibiotics:  Anti-infectives (From admission, onward)   Start     Dose/Rate Route Frequency Ordered Stop   08/30/19 1123  cefTRIAXone (ROCEPHIN) 2 g in sodium chloride 0.9 % 100 mL IVPB     2 g 200 mL/hr over 30 Minutes Intravenous 30 min pre-op 08/30/19 1123 08/30/19 1248    .  He was given sequential compression devices, early ambulation for DVT prophylaxis.  He benefited maximally from the hospital stay and there were no complications.    Recent vital signs:  Vitals:   08/31/19 0158 08/31/19 0600  BP: 129/72 134/73  Pulse: 72 70  Resp:  16  Temp: (!) 97.4 F (36.3 C) 97.7 F (36.5 C)  SpO2: 100% 97%    Recent laboratory studies:  Lab Results  Component Value Date   HGB 11.7 (L) 08/31/2019   HGB 13.2 08/30/2019   HGB 14.6 05/29/2019   Lab Results  Component Value Date   WBC 9.9 08/31/2019   PLT 162 08/31/2019   No results found for: INR Lab Results  Component Value Date   NA 136 08/31/2019   K 4.4 08/31/2019   CL 100 08/31/2019   CO2 27 08/31/2019   BUN 20 08/31/2019   CREATININE 0.75 08/31/2019   GLUCOSE  118 (H) 08/31/2019    Discharge Medications:   Allergies as of 08/31/2019   No Known Allergies     Medication List    STOP taking these medications   cephALEXin 500 MG capsule Commonly known as: KEFLEX   tamsulosin 0.4 MG Caps capsule Commonly known as: FLOMAX     TAKE these medications   acetaminophen 500 MG tablet Commonly known as: TYLENOL Take 1,000 mg by mouth every 6 (six) hours as needed.   HYDROcodone-acetaminophen 5-325 MG tablet Commonly known as: Norco Take 1 tablet by mouth every 6 (six) hours as needed for moderate pain.   ipratropium-albuterol 0.5-2.5 (3) MG/3ML Soln Commonly known as: DUONEB Inhale 3 ml vial three times a day in nebulizer   meloxicam 15 MG tablet Commonly known as: MOBIC Take 15 mg by mouth daily.       Diagnostic Studies: No results found.  Disposition: Discharge disposition: 01-Home or Self Care       Discharge Instructions    Discharge patient   Complete by: As directed    Discharge disposition: 01-Home or Self Care   Discharge patient date: 08/31/2019      Follow-up Information    Cleon Gustin, MD. Call on 09/06/2019.   Specialty: Urology Why: voiding trial Contact information: [place] [address] [phone]  SignedNicolette Bang 08/31/2019, 8:06 AM

## 2019-09-01 LAB — SURGICAL PATHOLOGY

## 2019-09-10 DIAGNOSIS — M1711 Unilateral primary osteoarthritis, right knee: Secondary | ICD-10-CM | POA: Diagnosis not present

## 2019-09-10 DIAGNOSIS — Z23 Encounter for immunization: Secondary | ICD-10-CM | POA: Diagnosis not present

## 2019-09-10 DIAGNOSIS — Z9079 Acquired absence of other genital organ(s): Secondary | ICD-10-CM | POA: Diagnosis not present

## 2019-09-10 DIAGNOSIS — J431 Panlobular emphysema: Secondary | ICD-10-CM | POA: Diagnosis not present

## 2019-09-29 DIAGNOSIS — R338 Other retention of urine: Secondary | ICD-10-CM | POA: Diagnosis not present

## 2019-10-19 DIAGNOSIS — R310 Gross hematuria: Secondary | ICD-10-CM | POA: Diagnosis not present

## 2019-11-25 ENCOUNTER — Other Ambulatory Visit (HOSPITAL_COMMUNITY): Payer: Self-pay | Admitting: Urology

## 2019-11-25 ENCOUNTER — Other Ambulatory Visit: Payer: Self-pay | Admitting: Urology

## 2019-11-25 DIAGNOSIS — C61 Malignant neoplasm of prostate: Secondary | ICD-10-CM

## 2019-12-15 ENCOUNTER — Ambulatory Visit (HOSPITAL_COMMUNITY)
Admission: RE | Admit: 2019-12-15 | Discharge: 2019-12-15 | Disposition: A | Discharge status: Home / Self Care | Payer: Medicare Other | Source: Ambulatory Visit | Attending: Urology | Admitting: Urology

## 2019-12-15 ENCOUNTER — Other Ambulatory Visit: Payer: Self-pay

## 2019-12-15 ENCOUNTER — Encounter (HOSPITAL_COMMUNITY)
Admission: RE | Admit: 2019-12-15 | Discharge: 2019-12-15 | Disposition: A | Discharge status: Home / Self Care | Payer: Medicare Other | Source: Ambulatory Visit | Attending: Urology | Admitting: Urology

## 2019-12-15 DIAGNOSIS — C61 Malignant neoplasm of prostate: Secondary | ICD-10-CM | POA: Diagnosis not present

## 2019-12-15 MED ORDER — TECHNETIUM TC 99M MEDRONATE IV KIT
21.6000 | PACK | Freq: Once | INTRAVENOUS | Status: AC
  Administered 2019-12-15: 11:00:00 21.6 via INTRAVENOUS

## 2019-12-24 DIAGNOSIS — C61 Malignant neoplasm of prostate: Secondary | ICD-10-CM | POA: Diagnosis not present

## 2020-01-19 DIAGNOSIS — C61 Malignant neoplasm of prostate: Secondary | ICD-10-CM | POA: Diagnosis not present

## 2020-01-25 DIAGNOSIS — C61 Malignant neoplasm of prostate: Secondary | ICD-10-CM | POA: Diagnosis not present

## 2020-03-16 DIAGNOSIS — M1711 Unilateral primary osteoarthritis, right knee: Secondary | ICD-10-CM | POA: Diagnosis not present

## 2020-03-16 DIAGNOSIS — C7951 Secondary malignant neoplasm of bone: Secondary | ICD-10-CM | POA: Diagnosis not present

## 2020-03-16 DIAGNOSIS — C61 Malignant neoplasm of prostate: Secondary | ICD-10-CM | POA: Diagnosis not present

## 2020-03-16 DIAGNOSIS — Z0000 Encounter for general adult medical examination without abnormal findings: Secondary | ICD-10-CM | POA: Diagnosis not present

## 2020-03-16 DIAGNOSIS — J431 Panlobular emphysema: Secondary | ICD-10-CM | POA: Diagnosis not present

## 2020-03-16 DIAGNOSIS — Z9079 Acquired absence of other genital organ(s): Secondary | ICD-10-CM | POA: Diagnosis not present

## 2020-04-25 DIAGNOSIS — C61 Malignant neoplasm of prostate: Secondary | ICD-10-CM | POA: Diagnosis not present

## 2020-08-07 DIAGNOSIS — Z5111 Encounter for antineoplastic chemotherapy: Secondary | ICD-10-CM | POA: Diagnosis not present

## 2020-08-07 DIAGNOSIS — C61 Malignant neoplasm of prostate: Secondary | ICD-10-CM | POA: Diagnosis not present

## 2020-09-08 IMAGING — NM NM BONE WHOLE BODY
2 series · 2 of 2 positions shown · non-contrast
Comparison: CT 12/15/2019

CLINICAL DATA: Prostate cancer.  PSA equal [DATE].

EXAM:
NUCLEAR MEDICINE WHOLE BODY BONE SCAN
TECHNIQUE: Whole body anterior and posterior images were obtained approximately
3 hours after intravenous injection of radiopharmaceutical.
RADIOPHARMACEUTICALS:  21.6 mCi Rechnetium-JJm MDP IV

[Series 1: whole body · 2.66mm/px · 1 of 1 slices shown (1 of 2)]
[im 1/1]
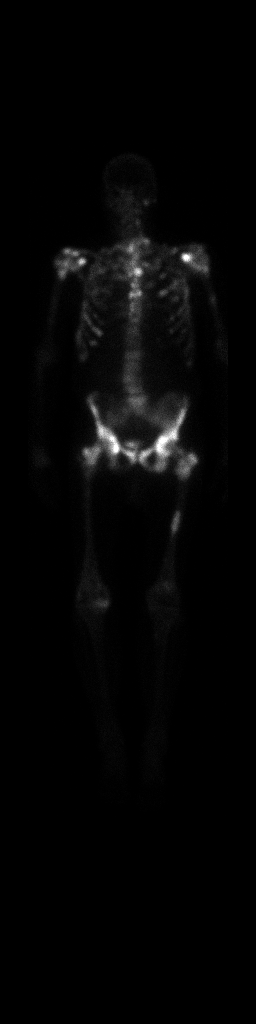

[Series 1: whole body · 2.66mm/px · 1 of 1 slices shown (2 of 2)]
[im 1/1]
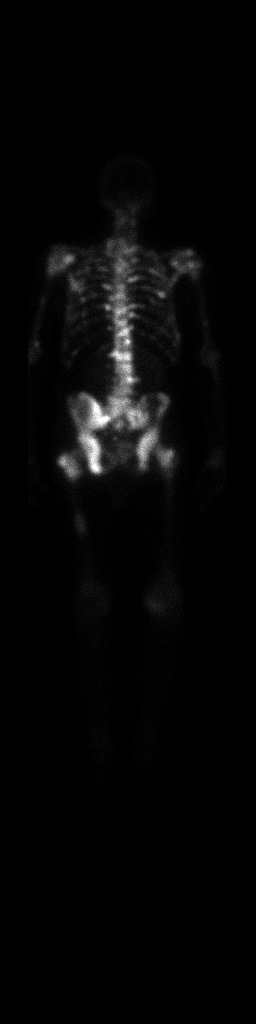

[2 of 2 positions shown; findings below may reference images not displayed]

FINDINGS: Multiple foci of abnormal radiotracer uptake in the axillary and
appendicular skeleton. Long lesion in the midshaft LEFT femur.
Abnormal uptake in the proximal LEFT and RIGHT femurs involving the
greater trochanters. Extensive uptake in the LEFT and RIGHT ischium.

There multiple foci of uptake within the ribs. Foci of uptake in the
shoulders.

The kidneys are not well evaluated consistent with super scan
physiology. This indicates widespread malignancy.
IMPRESSION: 1. Widespread metastatic prostate cancer in the axillary and
appendicular skeleton which corresponds to sclerotic lesion on
comparison CT.
2. Absent radiotracer in the kidneys consistent with so-called super
scan physiology.

## 2021-03-11 ENCOUNTER — Encounter (HOSPITAL_COMMUNITY): Payer: Self-pay | Admitting: Internal Medicine

## 2021-03-11 ENCOUNTER — Emergency Department (HOSPITAL_COMMUNITY): Payer: Medicare Other

## 2021-03-11 ENCOUNTER — Inpatient Hospital Stay (HOSPITAL_COMMUNITY)
Admission: EM | Admit: 2021-03-11 | Discharge: 2021-03-15 | DRG: 378 | Disposition: A | Discharge status: Home Health | Payer: Medicare Other | Attending: Family Medicine | Admitting: Family Medicine

## 2021-03-11 DIAGNOSIS — D62 Acute posthemorrhagic anemia: Secondary | ICD-10-CM | POA: Diagnosis present

## 2021-03-11 DIAGNOSIS — Z886 Allergy status to analgesic agent status: Secondary | ICD-10-CM

## 2021-03-11 DIAGNOSIS — Z791 Long term (current) use of non-steroidal anti-inflammatories (NSAID): Secondary | ICD-10-CM

## 2021-03-11 DIAGNOSIS — H9190 Unspecified hearing loss, unspecified ear: Secondary | ICD-10-CM

## 2021-03-11 DIAGNOSIS — K5730 Diverticulosis of large intestine without perforation or abscess without bleeding: Secondary | ICD-10-CM | POA: Diagnosis present

## 2021-03-11 DIAGNOSIS — D696 Thrombocytopenia, unspecified: Secondary | ICD-10-CM | POA: Diagnosis present

## 2021-03-11 DIAGNOSIS — I714 Abdominal aortic aneurysm, without rupture: Secondary | ICD-10-CM | POA: Diagnosis present

## 2021-03-11 DIAGNOSIS — B9681 Helicobacter pylori [H. pylori] as the cause of diseases classified elsewhere: Secondary | ICD-10-CM

## 2021-03-11 DIAGNOSIS — K254 Chronic or unspecified gastric ulcer with hemorrhage: Principal | ICD-10-CM | POA: Diagnosis present

## 2021-03-11 DIAGNOSIS — K4020 Bilateral inguinal hernia, without obstruction or gangrene, not specified as recurrent: Secondary | ICD-10-CM | POA: Diagnosis present

## 2021-03-11 DIAGNOSIS — H9193 Unspecified hearing loss, bilateral: Secondary | ICD-10-CM | POA: Diagnosis not present

## 2021-03-11 DIAGNOSIS — Z20822 Contact with and (suspected) exposure to covid-19: Secondary | ICD-10-CM | POA: Diagnosis present

## 2021-03-11 DIAGNOSIS — D539 Nutritional anemia, unspecified: Secondary | ICD-10-CM | POA: Diagnosis present

## 2021-03-11 DIAGNOSIS — K920 Hematemesis: Secondary | ICD-10-CM | POA: Diagnosis not present

## 2021-03-11 DIAGNOSIS — Z87891 Personal history of nicotine dependence: Secondary | ICD-10-CM

## 2021-03-11 DIAGNOSIS — K2950 Unspecified chronic gastritis without bleeding: Secondary | ICD-10-CM | POA: Diagnosis present

## 2021-03-11 DIAGNOSIS — K760 Fatty (change of) liver, not elsewhere classified: Secondary | ICD-10-CM | POA: Diagnosis present

## 2021-03-11 DIAGNOSIS — Z79899 Other long term (current) drug therapy: Secondary | ICD-10-CM

## 2021-03-11 DIAGNOSIS — Z8616 Personal history of COVID-19: Secondary | ICD-10-CM

## 2021-03-11 DIAGNOSIS — K257 Chronic gastric ulcer without hemorrhage or perforation: Secondary | ICD-10-CM

## 2021-03-11 DIAGNOSIS — J449 Chronic obstructive pulmonary disease, unspecified: Secondary | ICD-10-CM | POA: Diagnosis present

## 2021-03-11 DIAGNOSIS — C7951 Secondary malignant neoplasm of bone: Secondary | ICD-10-CM | POA: Diagnosis present

## 2021-03-11 DIAGNOSIS — Z8546 Personal history of malignant neoplasm of prostate: Secondary | ICD-10-CM

## 2021-03-11 LAB — COMPREHENSIVE METABOLIC PANEL
ALT: 9 U/L (ref 0–44)
AST: 15 U/L (ref 15–41)
Albumin: 2.9 g/dL — ABNORMAL LOW (ref 3.5–5.0)
Alkaline Phosphatase: 115 U/L (ref 38–126)
Anion gap: 6 (ref 5–15)
BUN: 70 mg/dL — ABNORMAL HIGH (ref 8–23)
CO2: 28 mmol/L (ref 22–32)
Calcium: 8.9 mg/dL (ref 8.9–10.3)
Chloride: 106 mmol/L (ref 98–111)
Creatinine, Ser: 0.93 mg/dL (ref 0.61–1.24)
GFR, Estimated: >60 mL/min (ref >60)
Glucose, Bld: 138 mg/dL — ABNORMAL HIGH (ref 70–99)
Potassium: 4.4 mmol/L (ref 3.5–5.1)
Sodium: 140 mmol/L (ref 135–145)
Total Bilirubin: 0.6 mg/dL (ref 0.3–1.2)
Total Protein: 5.9 g/dL — ABNORMAL LOW (ref 6.5–8.1)

## 2021-03-11 LAB — CBC WITH DIFFERENTIAL/PLATELET
Abs Immature Granulocytes: 0.08 10*3/uL — ABNORMAL HIGH (ref 0.00–0.07)
Basophils Absolute: 0.1 10*3/uL (ref 0.0–0.1)
Basophils Relative: 0 %
Eosinophils Absolute: 0.1 10*3/uL (ref 0.0–0.5)
Eosinophils Relative: 1 %
HCT: 32.1 % — ABNORMAL LOW (ref 39.0–52.0)
Hemoglobin: 10.4 g/dL — ABNORMAL LOW (ref 13.0–17.0)
Immature Granulocytes: 1 %
Lymphocytes Relative: 13 %
Lymphs Abs: 1.8 10*3/uL (ref 0.7–4.0)
MCH: 32.9 pg (ref 26.0–34.0)
MCHC: 32.4 g/dL (ref 30.0–36.0)
MCV: 101.6 fL — ABNORMAL HIGH (ref 80.0–100.0)
Monocytes Absolute: 1.2 10*3/uL — ABNORMAL HIGH (ref 0.1–1.0)
Monocytes Relative: 8 %
Neutro Abs: 10.9 10*3/uL — ABNORMAL HIGH (ref 1.7–7.7)
Neutrophils Relative %: 77 %
Platelets: 175 10*3/uL (ref 150–400)
RBC: 3.16 MIL/uL — ABNORMAL LOW (ref 4.22–5.81)
RDW: 14.7 % (ref 11.5–15.5)
WBC: 14 10*3/uL — ABNORMAL HIGH (ref 4.0–10.5)
nRBC: 0 % (ref 0.0–0.2)

## 2021-03-11 LAB — URINALYSIS, ROUTINE W REFLEX MICROSCOPIC
Bacteria, UA: NONE SEEN
Bilirubin Urine: NEGATIVE
Glucose, UA: NEGATIVE mg/dL
Ketones, ur: 5 mg/dL — AB
Leukocytes,Ua: NEGATIVE
Nitrite: NEGATIVE
Protein, ur: NEGATIVE mg/dL
Specific Gravity, Urine: >1.046 — ABNORMAL HIGH (ref 1.005–1.030)
pH: 5 (ref 5.0–8.0)

## 2021-03-11 LAB — LIPASE, BLOOD: Lipase: 24 U/L (ref 11–51)

## 2021-03-11 MED ORDER — SODIUM CHLORIDE 0.9 % IV SOLN
8.0000 mg/h | INTRAVENOUS | Status: AC
  Administered 2021-03-11 – 2021-03-14 (×6): 8 mg/h via INTRAVENOUS
  Filled 2021-03-11 (×8): qty 80

## 2021-03-11 MED ORDER — ONDANSETRON HCL 4 MG/2ML IJ SOLN: 4.0000 mg | Freq: Four times a day (QID) | INTRAMUSCULAR | Status: DC | PRN

## 2021-03-11 MED ORDER — ACETAMINOPHEN 650 MG RE SUPP
650.0000 mg | Freq: Four times a day (QID) | RECTAL | Status: DC | PRN
Start: 2021-03-11 — End: 2021-03-15

## 2021-03-11 MED ORDER — SODIUM CHLORIDE 0.9 % IV SOLN
80.0000 mg | Freq: Once | INTRAVENOUS | Status: AC
  Administered 2021-03-11: 80 mg via INTRAVENOUS
  Filled 2021-03-11: qty 80

## 2021-03-11 MED ORDER — SODIUM CHLORIDE 0.9 % IV SOLN: INTRAVENOUS | Status: DC

## 2021-03-11 MED ORDER — ONDANSETRON HCL 4 MG/2ML IJ SOLN
4.0000 mg | Freq: Once | INTRAMUSCULAR | Status: AC
  Administered 2021-03-11: 4 mg via INTRAVENOUS
  Filled 2021-03-11: qty 2

## 2021-03-11 MED ORDER — ACETAMINOPHEN 325 MG PO TABS: 650.0000 mg | ORAL_TABLET | Freq: Four times a day (QID) | ORAL | Status: DC | PRN

## 2021-03-11 MED ORDER — ONDANSETRON HCL 4 MG PO TABS: 4.0000 mg | ORAL_TABLET | Freq: Four times a day (QID) | ORAL | Status: DC | PRN

## 2021-03-11 MED ORDER — PANTOPRAZOLE SODIUM 40 MG IV SOLR: 40.0000 mg | Freq: Two times a day (BID) | INTRAVENOUS | Status: DC

## 2021-03-11 MED ORDER — SODIUM CHLORIDE 0.9 % IV BOLUS
1000.0000 mL | Freq: Once | INTRAVENOUS | Status: AC
  Administered 2021-03-11: 1000 mL via INTRAVENOUS

## 2021-03-11 MED ORDER — IOHEXOL 300 MG/ML  SOLN
100.0000 mL | Freq: Once | INTRAMUSCULAR | Status: AC | PRN
  Administered 2021-03-11: 100 mL via INTRAVENOUS

## 2021-03-11 NOTE — ED Triage Notes (Signed)
BIB [facility] from home for bloody emesis x 1 episode at 5 pm today. Woke up feeling poorly at 4 am and reports he felt dizzy at times today. Patient with hx of prostate CA, bone CA, COPD. Had stopped all treatment for bone CA 3-4 months ago. Very HoH. No hearing aid with patient.

## 2021-03-11 NOTE — ED Provider Notes (Signed)
[facility] EMERGENCY DEPARTMENT Provider Note   CSN: [id number] Arrival date & time: 03/11/21  1814     History Chief Complaint  Patient presents with  . Emesis    Donald Ellis is a 87 y.o. male.  HPI Patient presents via EMS with concern for hematemesis.  Patient notes that he woke up this morning about 20 hours prior to ED arrival, feeling poorly, with dizziness.  No complete syncope, no fall.  Patient has a history of prostate cancer, bone cancer, COPD, is not currently getting therapy.  Patient has very poor hearing, which limits the history somewhat. EMS reports the patient had one episode of bloody emesis at home, and saved some of the material for demonstration. No report of hemodynamic instability in route. Patient denies pain, acknowledges weakness, nausea, anorexia.  No clear precipitating, alleviating, exacerbating factors.    Past Medical History:  Diagnosis Date  . Foley catheter in place    LAST CHANGED 07-04-2001  . HOH (hard of hearing)   . No pertinent past medical history   . Urinary retention     Patient Active Problem List   Diagnosis Date Noted  . BPH (benign prostatic hyperplasia) 08/30/2019    Past Surgical History:  Procedure Laterality Date  . CYSTOSCOPY WITH INSERTION OF UROLIFT N/A 07/01/2019   Procedure: CYSTOSCOPY WITH INSERTION OF UROLIFT;  Surgeon: Cleon Gustin, MD;  Location: [facility];  Service: Urology;  Laterality: N/A;  . TRANSURETHRAL RESECTION OF PROSTATE N/A 08/30/2019   Procedure: TRANSURETHRAL RESECTION OF THE PROSTATE (TURP);  Surgeon: Cleon Gustin, MD;  Location: [facility];  Service: Urology;  Laterality: N/A;  2 HRS       No family history on file.  Social History   Tobacco Use  . Smoking status: Former Research scientist (life sciences)  . Smokeless tobacco: Never Used  . Tobacco comment: Hasn't smoked in 40 years  Vaping Use  . Vaping Use: Never used  Substance Use Topics  .  Alcohol use: Not Currently  . Drug use: Never    Home Medications Prior to Admission medications   Medication Sig Start Date End Date Taking? Authorizing Provider  acetaminophen (TYLENOL) 500 MG tablet Take 1,000 mg by mouth every 6 (six) hours as needed.    [provider]  HYDROcodone-acetaminophen (NORCO) 5-325 MG tablet Take 1 tablet by mouth every 6 (six) hours as needed for moderate pain. 08/31/19   McKenzie, Candee Furbish, MD  ipratropium-albuterol (DUONEB) 0.5-2.5 (3) MG/3ML SOLN Inhale 3 ml vial three times a day in nebulizer 02/03/19   [provider]  meloxicam (MOBIC) 15 MG tablet Take 15 mg by mouth daily.    [provider]    Allergies    Patient has no known allergies.  Review of Systems   Review of Systems  Unable to perform ROS: Other  History somewhat limited due to the patient's inability to hear anything.  Pertinent ROS details as above, per patient, and EMS.  Physical Exam Updated Vital Signs BP (!) 146/76   Pulse 65   Temp 97.6 F (36.4 C) (Oral)   Resp 18   SpO2 100%   Physical Exam Vitals and nursing note reviewed.  Constitutional:      General: He is not in acute distress.    Appearance: He is well-developed. He is ill-appearing. He is not toxic-appearing.     Comments: Thin elderly male sitting upright.  Very poor hearing, but alert, attentive.  Patient has  emesis bag with some evidence of blood in it.  HENT:     Head: Normocephalic and atraumatic.  Eyes:     Conjunctiva/sclera: Conjunctivae normal.  Cardiovascular:     Rate and Rhythm: Normal rate and regular rhythm.  Pulmonary:     Effort: Pulmonary effort is normal. No respiratory distress.     Breath sounds: No stridor.  Abdominal:     General: There is no distension.     Tenderness: There is abdominal tenderness.  Skin:    General: Skin is warm and dry.  Neurological:     Mental Status: He is alert and oriented to person, place, and time.     ED Results /  Procedures / Treatments   Labs (all labs ordered are listed, but only abnormal results are displayed) Labs Reviewed  COMPREHENSIVE METABOLIC PANEL - Abnormal; Notable for the following components:      Result Value   Glucose, Bld 138 (*)    BUN 70 (*)    Total Protein 5.9 (*)    Albumin 2.9 (*)    All other components within normal limits  CBC WITH DIFFERENTIAL/PLATELET - Abnormal; Notable for the following components:   WBC 14.0 (*)    RBC 3.16 (*)    Hemoglobin 10.4 (*)    HCT 32.1 (*)    MCV 101.6 (*)    Neutro Abs 10.9 (*)    Monocytes Absolute 1.2 (*)    Abs Immature Granulocytes 0.08 (*)    All other components within normal limits  LIPASE, BLOOD  URINALYSIS, ROUTINE W REFLEX MICROSCOPIC    EKG None  Radiology No results found.  Procedures Procedures   Medications Ordered in ED Medications  ondansetron (ZOFRAN) injection 4 mg (4 mg Intravenous Given 03/11/21 2001)  sodium chloride 0.9 % bolus 1,000 mL (0 mLs Intravenous Stopped 03/11/21 2002)  iohexol (OMNIPAQUE) 300 MG/ML solution 100 mL (100 mLs Intravenous Contrast Given 03/11/21 1959)    ED Course  I have reviewed the triage vital signs and the nursing notes.  Pertinent labs & imaging results that were available during my care of the patient were reviewed by me and considered in my medical decision making (see chart for details).    8:17 PM Patient feeling better, no additional vomiting.    MDM Rules/Calculators/A&P Adult male with multiple malignancies presents with 1 day of hematemesis, abdominal pain.  Patient is awake, alert, does have vomiting on arrival, but this stops with fluids, Zofran. CT scan demonstrates progression of metastatic disease.  Labs notable for drop in hemoglobin from base of 13 now 10.  Patient received pantoprazole IV, had resolution of his nausea, vomiting, but given his episode of hematemesis he required admission for overnight monitoring, management. GI colleagues made aware for  next day consult via secure chat. (Dr. Fuller Plan) Final Clinical Impression(s) / ED Diagnoses Final diagnoses:  Hematemesis with nausea     Carmin Muskrat, MD 03/11/21 2343

## 2021-03-11 NOTE — ED Notes (Signed)
Spoke with Melissa at blood bank, they have type and screen tubes in blood bank. Collected on previous shift. Patient has on blood bank bracelet.

## 2021-03-11 NOTE — H&P (Signed)
History and Physical    Donald Ellis OXB:[id number] DOB: 1934-10-23 DOA: 03/11/2021  PCP: Christain Sacramento, MD  Patient coming from: Home  I have personally briefly reviewed patient's old medical records in [facility]  Chief Complaint: Hematemesis  HPI: Donald Ellis is a 87 y.o. male with medical history significant of prostate CA with mets to bone.  Patient presents to ED after 1 episode of hematemesis at home today.  Patient woke up not feeling well about 20h PTA.  Dizziness, no syncope, no fall.  EMS called.  Pt with 1 episode of bloody emesis at home, saved some of the material for demonstration.  No hemodynamic instability en route.  No pain, does have weakness, nausea.  Nothing makes better or worse.  Patient is VERY hard of hearing which complicates history taking.   ED Course: BUN of 70, creat 0.9.  HGB 10.4 down from 11.7 but this last in 2020.  Pt doesn't appear to take any blood thinners based on prelim med rec here as well as his PCPs med rec from Feb office visit.  COVID neg, looks like he may have had COVID in Feb.   Review of Systems: As per HPI, otherwise all review of systems negative.  Past Medical History:  Diagnosis Date  . Foley catheter in place    LAST CHANGED 07-04-2001  . HOH (hard of hearing)   . Urinary retention     Past Surgical History:  Procedure Laterality Date  . CYSTOSCOPY WITH INSERTION OF UROLIFT N/A 07/01/2019   Procedure: CYSTOSCOPY WITH INSERTION OF UROLIFT;  Surgeon: Cleon Gustin, MD;  Location: [facility];  Service: Urology;  Laterality: N/A;  . TRANSURETHRAL RESECTION OF PROSTATE N/A 08/30/2019   Procedure: TRANSURETHRAL RESECTION OF THE PROSTATE (TURP);  Surgeon: Cleon Gustin, MD;  Location: [facility];  Service: Urology;  Laterality: N/A;  2 HRS     reports that he has quit smoking. He has never used smokeless tobacco. He reports previous alcohol use. He reports that he  does not use drugs.  Allergies  Allergen Reactions  . Hydrocodone-Acetaminophen Itching    Itching and tingling without a rash    Family History  Problem Relation Age of Onset  . GI Bleed Neg Hx      Prior to Admission medications   Medication Sig Start Date End Date Taking? Authorizing Provider  acetaminophen (TYLENOL) 500 MG tablet Take 1,000 mg by mouth every 6 (six) hours as needed.    [provider]  HYDROcodone-acetaminophen (NORCO) 5-325 MG tablet Take 1 tablet by mouth every 6 (six) hours as needed for moderate pain. 08/31/19   McKenzie, Candee Furbish, MD  ipratropium-albuterol (DUONEB) 0.5-2.5 (3) MG/3ML SOLN Inhale 3 mLs into the lungs every 6 (six) hours as needed. 02/03/19   [provider]  meloxicam (MOBIC) 15 MG tablet Take 15 mg by mouth daily.    [provider]    Physical Exam: Vitals:   03/11/21 2100 03/11/21 2130 03/11/21 2200 03/11/21 2230  BP: 121/70 131/66 123/76 135/82  Pulse: (!) 108 91 (!) 105 (!) 103  Resp: (!) 22 (!) 22 (!) 21 20  Temp:      TempSrc:      SpO2: 98% 99% 99% 99%    Constitutional: NAD, calm, comfortable Eyes: PERRL, lids and conjunctivae normal ENMT: Very hard of hearing. Neck: normal, supple, no masses, no thyromegaly Respiratory: clear to auscultation bilaterally, no wheezing, no crackles. Normal respiratory effort.  No accessory muscle use.  Cardiovascular: Regular rate and rhythm, no murmurs / rubs / gallops. No extremity edema. 2+ pedal pulses. No carotid bruits.  Abdomen: no tenderness, no masses palpated. No hepatosplenomegaly. Bowel sounds positive.  Musculoskeletal: no clubbing / cyanosis. No joint deformity upper and lower extremities. Good ROM, no contractures. Normal muscle tone.  Skin: no rashes, lesions, ulcers. No induration Neurologic: CN 2-12 grossly intact. Sensation intact, DTR normal. Strength 5/5 in all 4.  Psychiatric: Normal judgment and insight. Alert and oriented x 3. Normal mood.     Labs on Admission: I have personally reviewed following labs and imaging studies  CBC: Recent Labs  Lab 03/11/21 1832  WBC 14.0*  NEUTROABS 10.9*  HGB 10.4*  HCT 32.1*  MCV 101.6*  PLT 628   Basic Metabolic Panel: Recent Labs  Lab 03/11/21 1832  NA 140  K 4.4  CL 106  CO2 28  GLUCOSE 138*  BUN 70*  CREATININE 0.93  CALCIUM 8.9   GFR: CrCl cannot be calculated (Unknown ideal weight.). Liver Function Tests: Recent Labs  Lab 03/11/21 1832  AST 15  ALT 9  ALKPHOS 115  BILITOT 0.6  PROT 5.9*  ALBUMIN 2.9*   Recent Labs  Lab 03/11/21 1832  LIPASE 24   No results for input(s): AMMONIA in the last 168 hours. Coagulation Profile: No results for input(s): INR, PROTIME in the last 168 hours. Cardiac Enzymes: No results for input(s): CKTOTAL, CKMB, CKMBINDEX, TROPONINI in the last 168 hours. BNP (last 3 results) No results for input(s): PROBNP in the last 8760 hours. HbA1C: No results for input(s): HGBA1C in the last 72 hours. CBG: No results for input(s): GLUCAP in the last 168 hours. Lipid Profile: No results for input(s): CHOL, HDL, LDLCALC, TRIG, CHOLHDL, LDLDIRECT in the last 72 hours. Thyroid Function Tests: No results for input(s): TSH, T4TOTAL, FREET4, T3FREE, THYROIDAB in the last 72 hours. Anemia Panel: No results for input(s): VITAMINB12, FOLATE, FERRITIN, TIBC, IRON, RETICCTPCT in the last 72 hours. Urine analysis:    Component Value Date/Time   COLORURINE YELLOW 07/23/2019 0532   APPEARANCEUR CLEAR 07/23/2019 0532   LABSPEC 1.006 07/23/2019 0532   PHURINE 7.0 07/23/2019 0532   GLUCOSEU NEGATIVE 07/23/2019 0532   HGBUR MODERATE (A) 07/23/2019 0532   BILIRUBINUR NEGATIVE 07/23/2019 0532   KETONESUR NEGATIVE 07/23/2019 0532   PROTEINUR NEGATIVE 07/23/2019 0532   NITRITE NEGATIVE 07/23/2019 0532   LEUKOCYTESUR LARGE (A) 07/23/2019 0532    Radiological Exams on Admission: CT ABDOMEN PELVIS W CONTRAST  Result Date: 03/11/2021 CLINICAL  DATA:  Bloody emesis. EXAM: CT ABDOMEN AND PELVIS WITH CONTRAST TECHNIQUE: Multidetector CT imaging of the abdomen and pelvis was performed using the standard protocol following bolus administration of intravenous contrast. CONTRAST:  137mL OMNIPAQUE IOHEXOL 300 MG/ML  SOLN COMPARISON:  December 15, 2019 FINDINGS: Lower chest: No acute abnormality. Hepatobiliary: There is diffuse fatty infiltration of the liver parenchyma. No focal liver abnormality is seen. No gallstones, gallbladder wall thickening, or biliary dilatation. Pancreas: Unremarkable. No pancreatic ductal dilatation or surrounding inflammatory changes. Spleen: Normal in size without focal abnormality. Adrenals/Urinary Tract: Adrenal glands are unremarkable. Kidneys are normal in size, without renal calculi or hydronephrosis. An 8 mm well-defined focus of parenchymal low attenuation is seen within the posterior aspect of the left kidney. Bladder is unremarkable. Stomach/Bowel: Stomach is within normal limits. Appendix appears normal. No evidence of bowel dilatation. Noninflamed diverticula are seen throughout the sigmoid colon. Vascular/Lymphatic: Aortic atherosclerosis with stable, approximately 3.3 cm diameter  aneurysmal dilatation of the infrarenal abdominal aorta. No enlarged abdominal or pelvic lymph nodes. Reproductive: The prostate gland is markedly enlarged. Other: Small, stable bilateral fat containing inguinal hernias are seen. No evidence of abdominal or pelvic free fluid. Musculoskeletal: Numerous sclerotic foci are again seen scattered throughout the osseous skeleton. Multilevel degenerative changes seen throughout the lumbar spine. IMPRESSION: 1. Hepatic steatosis. 2. Sigmoid diverticulosis. 3. Stable, approximately 3.3 cm diameter infrarenal abdominal aortic aneurysm. 4. Findings consistent with diffuse osseous metastasis. 5. Stable small bilateral fat containing inguinal hernias. 6. Aortic atherosclerosis. Aortic Atherosclerosis  (ICD10-I70.0). Electronically Signed   By: Virgina Norfolk M.D.   On: 03/11/2021 20:21    EKG: Independently reviewed.  Assessment/Plan Principal Problem:   Hematemesis Active Problems:   HOH (hard of hearing)    1. Hematemesis - 1. IVF: 1L bolus and NS at 100 cc/hr 2. Repeat H/H at [place] and CBC at 0500 3. Strict intake and output 4. PPI bolus and gtt 5. Tele monitor 6. Dr. Fuller Plan to see in AM 7. Clear liquid diet 2. Hard of hearing - 1. Pt very likely needs a hearing aid 2. Consider referral to audiology on discharge.  DVT prophylaxis: SCDs Code Status: Full Family Communication: No family in room Disposition Plan: Home after GIB resolved Consults called: EDP called Dr. Fuller Plan Admission status: Place in obs   Donald Ellis, [facility]  How to contact the [facility] Attending or Consulting provider [place] or covering provider during after hours [employer], for this patient?  1. Check the care team in [facility] and look for a) attending/consulting [facility] provider listed and b) the [facility] team listed 2. Log into [email or web address]  Amion Physician Scheduling and messaging for groups and whole hospitals  On call and physician scheduling software for group practices, residents, hospitalists and other medical providers for call, clinic, rotation and shift schedules. On[employer] is a hospital-wide system for scheduling doctors and paging doctors on call. EasyPlot is for scientific plotting and data analysis.  [email or web address]  and use [place]'s universal password to access. If you do not have the password, please contact the hospital operator.  3. Locate the [facility] provider you are looking for under [facility] and page to a number that you can be directly reached. 4. If you still have difficulty reaching the provider, please page the [facility] (Director on Call) for the Hospitalists listed on amion for assistance.  03/11/2021, 10:41 PM

## 2021-03-11 NOTE — ED Notes (Signed)
Patient in CT

## 2021-03-11 NOTE — ED Notes (Signed)
Patient back from CT.

## 2021-03-11 NOTE — ED Notes (Signed)
Pt unable to provide urine at this time

## 2021-03-11 NOTE — ED Notes (Signed)
Dr. Gardner at bedside 

## 2021-03-12 ENCOUNTER — Encounter (HOSPITAL_COMMUNITY): Payer: Self-pay | Admitting: Internal Medicine

## 2021-03-12 ENCOUNTER — Observation Stay (HOSPITAL_COMMUNITY): Payer: Medicare Other | Admitting: Certified Registered Nurse Anesthetist

## 2021-03-12 ENCOUNTER — Encounter (HOSPITAL_COMMUNITY)
Admission: EM | Disposition: A | Discharge status: Home Health | Payer: Self-pay | Source: Home / Self Care | Attending: Family Medicine

## 2021-03-12 DIAGNOSIS — Z791 Long term (current) use of non-steroidal anti-inflammatories (NSAID): Secondary | ICD-10-CM | POA: Diagnosis not present

## 2021-03-12 DIAGNOSIS — K920 Hematemesis: Secondary | ICD-10-CM | POA: Diagnosis present

## 2021-03-12 DIAGNOSIS — Z20822 Contact with and (suspected) exposure to covid-19: Secondary | ICD-10-CM | POA: Diagnosis present

## 2021-03-12 DIAGNOSIS — D539 Nutritional anemia, unspecified: Secondary | ICD-10-CM | POA: Diagnosis present

## 2021-03-12 DIAGNOSIS — Z79899 Other long term (current) drug therapy: Secondary | ICD-10-CM | POA: Diagnosis not present

## 2021-03-12 DIAGNOSIS — K5730 Diverticulosis of large intestine without perforation or abscess without bleeding: Secondary | ICD-10-CM | POA: Diagnosis present

## 2021-03-12 DIAGNOSIS — H9190 Unspecified hearing loss, unspecified ear: Secondary | ICD-10-CM | POA: Diagnosis present

## 2021-03-12 DIAGNOSIS — C7951 Secondary malignant neoplasm of bone: Secondary | ICD-10-CM | POA: Diagnosis present

## 2021-03-12 DIAGNOSIS — Z87891 Personal history of nicotine dependence: Secondary | ICD-10-CM | POA: Diagnosis not present

## 2021-03-12 DIAGNOSIS — K4020 Bilateral inguinal hernia, without obstruction or gangrene, not specified as recurrent: Secondary | ICD-10-CM | POA: Diagnosis present

## 2021-03-12 DIAGNOSIS — K254 Chronic or unspecified gastric ulcer with hemorrhage: Secondary | ICD-10-CM | POA: Diagnosis present

## 2021-03-12 DIAGNOSIS — Z8546 Personal history of malignant neoplasm of prostate: Secondary | ICD-10-CM | POA: Diagnosis not present

## 2021-03-12 DIAGNOSIS — J449 Chronic obstructive pulmonary disease, unspecified: Secondary | ICD-10-CM | POA: Diagnosis present

## 2021-03-12 DIAGNOSIS — Z886 Allergy status to analgesic agent status: Secondary | ICD-10-CM | POA: Diagnosis not present

## 2021-03-12 DIAGNOSIS — K257 Chronic gastric ulcer without hemorrhage or perforation: Secondary | ICD-10-CM | POA: Diagnosis not present

## 2021-03-12 DIAGNOSIS — D696 Thrombocytopenia, unspecified: Secondary | ICD-10-CM | POA: Diagnosis present

## 2021-03-12 DIAGNOSIS — D62 Acute posthemorrhagic anemia: Secondary | ICD-10-CM | POA: Diagnosis present

## 2021-03-12 DIAGNOSIS — K2950 Unspecified chronic gastritis without bleeding: Secondary | ICD-10-CM | POA: Diagnosis present

## 2021-03-12 DIAGNOSIS — I714 Abdominal aortic aneurysm, without rupture: Secondary | ICD-10-CM | POA: Diagnosis present

## 2021-03-12 DIAGNOSIS — K760 Fatty (change of) liver, not elsewhere classified: Secondary | ICD-10-CM | POA: Diagnosis present

## 2021-03-12 DIAGNOSIS — Z8616 Personal history of COVID-19: Secondary | ICD-10-CM | POA: Diagnosis not present

## 2021-03-12 DIAGNOSIS — B9681 Helicobacter pylori [H. pylori] as the cause of diseases classified elsewhere: Secondary | ICD-10-CM | POA: Diagnosis present

## 2021-03-12 HISTORY — PX: HOT HEMOSTASIS: SHX5433

## 2021-03-12 HISTORY — PX: SCLEROTHERAPY: SHX6841

## 2021-03-12 HISTORY — PX: ESOPHAGOGASTRODUODENOSCOPY (EGD) WITH PROPOFOL: SHX5813

## 2021-03-12 HISTORY — PX: BIOPSY: SHX5522

## 2021-03-12 LAB — POCT I-STAT EG7
Acid-Base Excess: 2 mmol/L (ref 0.0–2.0)
Bicarbonate: 28.8 mmol/L — ABNORMAL HIGH (ref 20.0–28.0)
Calcium, Ion: 1.31 mmol/L (ref 1.15–1.40)
HCT: 25 % — ABNORMAL LOW (ref 39.0–52.0)
Hemoglobin: 8.5 g/dL — ABNORMAL LOW (ref 13.0–17.0)
O2 Saturation: 89 %
Potassium: 4 mmol/L (ref 3.5–5.1)
Sodium: 142 mmol/L (ref 135–145)
TCO2: 30 mmol/L (ref 22–32)
pCO2, Ven: 56.4 mmHg (ref 44.0–60.0)
pH, Ven: 7.316 (ref 7.250–7.430)
pO2, Ven: 63 mmHg — ABNORMAL HIGH (ref 32.0–45.0)

## 2021-03-12 LAB — TYPE AND SCREEN
ABO/RH(D): O POS
Antibody Screen: NEGATIVE

## 2021-03-12 LAB — HEMOGLOBIN AND HEMATOCRIT, BLOOD
HCT: 26 % — ABNORMAL LOW (ref 39.0–52.0)
HCT: 28.4 % — ABNORMAL LOW (ref 39.0–52.0)
HCT: 28.9 % — ABNORMAL LOW (ref 39.0–52.0)
Hemoglobin: 8.5 g/dL — ABNORMAL LOW (ref 13.0–17.0)
Hemoglobin: 9.2 g/dL — ABNORMAL LOW (ref 13.0–17.0)
Hemoglobin: 9.2 g/dL — ABNORMAL LOW (ref 13.0–17.0)

## 2021-03-12 LAB — BASIC METABOLIC PANEL
Anion gap: 5 (ref 5–15)
BUN: 59 mg/dL — ABNORMAL HIGH (ref 8–23)
CO2: 27 mmol/L (ref 22–32)
Calcium: 8.7 mg/dL — ABNORMAL LOW (ref 8.9–10.3)
Chloride: 108 mmol/L (ref 98–111)
Creatinine, Ser: 0.84 mg/dL (ref 0.61–1.24)
GFR, Estimated: >60 mL/min (ref >60)
Glucose, Bld: 113 mg/dL — ABNORMAL HIGH (ref 70–99)
Potassium: 4 mmol/L (ref 3.5–5.1)
Sodium: 140 mmol/L (ref 135–145)

## 2021-03-12 LAB — CBC
HCT: 28.5 % — ABNORMAL LOW (ref 39.0–52.0)
HCT: 26.4 % — ABNORMAL LOW (ref 39.0–52.0)
Hemoglobin: 9.2 g/dL — ABNORMAL LOW (ref 13.0–17.0)
Hemoglobin: 8.5 g/dL — ABNORMAL LOW (ref 13.0–17.0)
MCH: 33.7 pg (ref 26.0–34.0)
MCH: 32.8 pg (ref 26.0–34.0)
MCHC: 32.3 g/dL (ref 30.0–36.0)
MCHC: 32.2 g/dL (ref 30.0–36.0)
MCV: 104.4 fL — ABNORMAL HIGH (ref 80.0–100.0)
MCV: 101.9 fL — ABNORMAL HIGH (ref 80.0–100.0)
Platelets: 163 10*3/uL (ref 150–400)
Platelets: 156 10*3/uL (ref 150–400)
RBC: 2.73 MIL/uL — ABNORMAL LOW (ref 4.22–5.81)
RBC: 2.59 MIL/uL — ABNORMAL LOW (ref 4.22–5.81)
RDW: 15.2 % (ref 11.5–15.5)
RDW: 15.1 % (ref 11.5–15.5)
WBC: 12.7 10*3/uL — ABNORMAL HIGH (ref 4.0–10.5)
WBC: 10.9 10*3/uL — ABNORMAL HIGH (ref 4.0–10.5)
nRBC: 0 % (ref 0.0–0.2)
nRBC: 0 % (ref 0.0–0.2)

## 2021-03-12 LAB — ABO/RH: ABO/RH(D): O POS

## 2021-03-12 LAB — SARS CORONAVIRUS 2 (TAT 6-24 HRS): SARS Coronavirus 2: NEGATIVE

## 2021-03-12 SURGERY — ESOPHAGOGASTRODUODENOSCOPY (EGD) WITH PROPOFOL
Anesthesia: Monitor Anesthesia Care

## 2021-03-12 MED ORDER — ONDANSETRON HCL 4 MG/2ML IJ SOLN
INTRAMUSCULAR | Status: DC | PRN
  Administered 2021-03-12: 4 mg via INTRAVENOUS

## 2021-03-12 MED ORDER — SUGAMMADEX SODIUM 200 MG/2ML IV SOLN
INTRAVENOUS | Status: DC | PRN
  Administered 2021-03-12: 200 mg via INTRAVENOUS

## 2021-03-12 MED ORDER — LACTATED RINGERS IV SOLN
INTRAVENOUS | Status: AC | PRN
  Administered 2021-03-12: 10 mL/h via INTRAVENOUS

## 2021-03-12 MED ORDER — EPINEPHRINE 1 MG/10ML IJ SOSY
PREFILLED_SYRINGE | INTRAMUSCULAR | Status: AC
  Filled 2021-03-12: qty 10

## 2021-03-12 MED ORDER — SODIUM CHLORIDE (PF) 0.9 % IJ SOLN
PREFILLED_SYRINGE | INTRAMUSCULAR | Status: DC | PRN
  Administered 2021-03-12: 9 mL

## 2021-03-12 MED ORDER — LIDOCAINE 2% (20 MG/ML) 5 ML SYRINGE
INTRAMUSCULAR | Status: DC | PRN
  Administered 2021-03-12: 40 mg via INTRAVENOUS

## 2021-03-12 MED ORDER — PROPOFOL 500 MG/50ML IV EMUL
INTRAVENOUS | Status: DC | PRN
  Administered 2021-03-12: 100 ug/kg/min via INTRAVENOUS

## 2021-03-12 MED ORDER — SUCCINYLCHOLINE CHLORIDE 200 MG/10ML IV SOSY
PREFILLED_SYRINGE | INTRAVENOUS | Status: DC | PRN
  Administered 2021-03-12: 100 mg via INTRAVENOUS

## 2021-03-12 MED ORDER — PHENYLEPHRINE 40 MCG/ML (10ML) SYRINGE FOR IV PUSH (FOR BLOOD PRESSURE SUPPORT)
PREFILLED_SYRINGE | INTRAVENOUS | Status: DC | PRN
  Administered 2021-03-12: 80 ug via INTRAVENOUS

## 2021-03-12 MED ORDER — ROCURONIUM BROMIDE 10 MG/ML (PF) SYRINGE
PREFILLED_SYRINGE | INTRAVENOUS | Status: DC | PRN
  Administered 2021-03-12: 40 mg via INTRAVENOUS

## 2021-03-12 MED ORDER — PHENYLEPHRINE HCL-NACL 10-0.9 MG/250ML-% IV SOLN
INTRAVENOUS | Status: DC | PRN
  Administered 2021-03-12: 40 ug/min via INTRAVENOUS

## 2021-03-12 MED ORDER — PROPOFOL 10 MG/ML IV BOLUS
INTRAVENOUS | Status: DC | PRN
  Administered 2021-03-12: 30 mg via INTRAVENOUS
  Administered 2021-03-12: 80 mg via INTRAVENOUS

## 2021-03-12 MED ORDER — DEXAMETHASONE SODIUM PHOSPHATE 10 MG/ML IJ SOLN
INTRAMUSCULAR | Status: DC | PRN
  Administered 2021-03-12: 5 mg via INTRAVENOUS

## 2021-03-12 SURGICAL SUPPLY — 14 items
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
## 2021-03-12 NOTE — ED Notes (Signed)
Pt transported to Endo 

## 2021-03-12 NOTE — Interval H&P Note (Signed)
History and Physical Interval Note:  03/12/2021 2:09 PM  Donald Ellis  has presented today for surgery, with the diagnosis of hematemesis     blood loss anemia..  The various methods of treatment have been discussed with the patient and family. After consideration of risks, benefits and other options for treatment, the patient has consented to  Procedure(s): ESOPHAGOGASTRODUODENOSCOPY (EGD) WITH PROPOFOL (N/A) as a surgical intervention.  The patient's history has been reviewed, patient examined, no change in status, stable for surgery.  I have reviewed the patient's chart and labs.  Questions were answered to the patient's satisfaction.     [name]

## 2021-03-12 NOTE — Anesthesia Procedure Notes (Signed)
Procedure Name: MAC Date/Time: 03/12/2021 2:09 PM Performed by: Reece Agar, CRNA Pre-anesthesia Checklist: Patient identified, Emergency Drugs available, Suction available and Patient being monitored Patient Re-evaluated:Patient Re-evaluated prior to induction Oxygen Delivery Method: Nasal cannula

## 2021-03-12 NOTE — Transfer of Care (Signed)
Immediate Anesthesia Transfer of Care Note  Patient: Donald Ellis  Procedure(s) Performed: ESOPHAGOGASTRODUODENOSCOPY (EGD) WITH PROPOFOL (N/A ) SCLEROTHERAPY HOT HEMOSTASIS (ARGON PLASMA COAGULATION/BICAP) (N/A ) BIOPSY  Patient Location: PACU  Anesthesia Type:General  Level of Consciousness: awake and alert   Airway & Oxygen Therapy: Patient Spontanous Breathing and Patient connected to nasal cannula oxygen  Post-op Assessment: Report given to RN and Post -op Vital signs reviewed and stable  Post vital signs: Reviewed and stable  Last Vitals:  Vitals Value Taken Time  BP 151/69 03/12/21 1513  Temp    Pulse 97 03/12/21 1518  Resp 15 03/12/21 1518  SpO2 94 % 03/12/21 1518  Vitals shown include unvalidated device data.  Last Pain:  Vitals:   03/12/21 1310  TempSrc: Oral  PainSc: 0-No pain         Complications: No complications documented.

## 2021-03-12 NOTE — Anesthesia Procedure Notes (Signed)
Procedure Name: Intubation Date/Time: 03/12/2021 2:42 PM Performed by: Reece Agar, CRNA Pre-anesthesia Checklist: Patient identified, Emergency Drugs available, Suction available, Patient being monitored and Timeout performed Patient Re-evaluated:Patient Re-evaluated prior to induction Oxygen Delivery Method: Circle system utilized Preoxygenation: Pre-oxygenation with 100% oxygen Induction Type: IV induction, Rapid sequence and Cricoid Pressure applied Ventilation: Unable to mask ventilate Laryngoscope Size: Mac and 4 Grade View: Grade I Tube type: Oral Tube size: 7.5 mm Number of attempts: 1 Airway Equipment and Method: Stylet Placement Confirmation: ETT inserted through vocal cords under direct vision,  breath sounds checked- equal and bilateral and CO2 detector Secured at: 22 cm Tube secured with: Tape Dental Injury: Teeth and Oropharynx as per pre-operative assessment

## 2021-03-12 NOTE — Op Note (Signed)
[facility] Patient Name: Donald Ellis Procedure Date : 03/12/2021 MRN: [id number] Attending MD: Estill Cotta. Loletha Carrow , MD Date of Birth: Aug 19, 1934 CSN: [id number] Age: 87 Admit Type: Inpatient Procedure:                Upper GI endoscopy Indications:              Acute post hemorrhagic anemia, Hematemesis Providers:                Mallie Mussel L. Loletha Carrow, MD, [facility], RN, Tyna Jaksch                            Technician Referring MD:             [facility] Medicines:                General Anesthesia (converted from [place] due to                            bleeding) Complications:            No immediate complications. Estimated Blood Loss:     Estimated blood loss: 50 mL. Procedure:                Pre-Anesthesia Assessment:                           - Prior to the procedure, a History and Physical                            was performed, and patient medications and                            allergies were reviewed. The patient's tolerance of                            previous anesthesia was also reviewed. The risks                            and benefits of the procedure and the sedation                            options and risks were discussed with the patient.                            All questions were answered, and informed consent                            was obtained. Prior Anticoagulants: The patient has                            taken no previous anticoagulant or antiplatelet                            agents. ASA Grade Assessment: IV - A patient with  severe systemic disease that is a constant threat                            to life. After reviewing the risks and benefits,                            the patient was deemed in satisfactory condition to                            undergo the procedure.                           After obtaining informed consent, the endoscope was                            passed under direct  vision. Throughout the                            procedure, the patient's blood pressure, pulse, and                            oxygen saturations were monitored continuously. The                            [id number]-H190 ([id number] Olympus gastroscope was                            introduced through the mouth, and advanced to the                            second part of duodenum. The upper GI endoscopy was                            accomplished without difficulty. The patient                            tolerated the procedure well. Scope In: Scope Out: Findings:      The esophagus was normal.      Two non-bleeding cratered gastric ulcers, one with a visible vessel were       found on the posterior wall of the gastric body. The largest lesion was       15 mm in largest dimension. Area was successfully injected with 9 mL of       a 1:10,000 solution of epinephrine for hemostasis (1cc x 3 at edges of       ulcer before cautery, 6 add'l cc directly into bleeding vessel. There       was excellent tissue blanching. Coagulation for hemostasis using bipolar       probe was successful. Vessel was cauterized b/c high risk stigmata.       Vessel then opened up and bled, requiring add'l epi and bipolar cautery.       This slowed but did not stop bleeding. Scope removed and patient       intubated for airway protection. Scope reinserted and advanced to area  of the bleeding ulcer, which had stopped bleeding by then. Additional       coagulation for hemostasis of bleeding caused by the procedure using       argon plasma was successful.      Many non-bleeding superficial gastric ulcers with no stigmata of       bleeding were found on the greater curvature of the gastric body. The       largest lesion was 3 mm in largest dimension. Biopsies were taken with a       cold forceps for histology (antrum and body, to r/o H pylori).      The exam of the stomach was otherwise normal.      The cardia and  gastric fundus were normal on retroflexion.      The examined duodenum was normal. Impression:               - Normal esophagus.                           - Non-bleeding gastric ulcers with a visible                            vessel. Injected. Treated with bipolar cautery.                            Treated with argon plasma coagulation (APC).                           - Non-bleeding gastric ulcers with no stigmata of                            bleeding. Biopsied.                           - Normal examined duodenum. Moderate Sedation:      GETA Recommendation:           - Return patient to hospital ward for ongoing care.                           - NPO today.                           - Istat now, then Q 4 hour H/H overnight. Transfuse                            for Hgb < or = 7.5                           Patient's son will be updated by phone and message                            sent to hospitalist physician.                           Continue protonix drip at 8 mg/hr for 36-48 hours.                           -  Low threshold to repeat EGD if concern for                            recurrent bleeding. Procedure Code(s):        --- Professional ---                           [phone], 85, Esophagogastroduodenoscopy, flexible,                            transoral; with control of bleeding, any method                           43239, Esophagogastroduodenoscopy, flexible,                            transoral; with biopsy, single or multiple Diagnosis Code(s):        --- Professional ---                           K25.4, Chronic or unspecified gastric ulcer with                            hemorrhage                           K25.9, Gastric ulcer, unspecified as acute or                            chronic, without hemorrhage or perforation                           D62, Acute posthemorrhagic anemia                           K92.0, Hematemesis CPT copyright 2019 American Medical Association.  All rights reserved. The codes documented in this report are preliminary and upon coder review may  be revised to meet current compliance requirements. Maddox Bratcher L. Loletha Carrow, MD 03/12/2021 3:09:38 PM This report has been signed electronically. Number of Addenda: 0

## 2021-03-12 NOTE — Anesthesia Postprocedure Evaluation (Signed)
Anesthesia Post Note  Patient: Donald Ellis  Procedure(s) Performed: ESOPHAGOGASTRODUODENOSCOPY (EGD) WITH PROPOFOL (N/A ) SCLEROTHERAPY HOT HEMOSTASIS (ARGON PLASMA COAGULATION/BICAP) (N/A ) BIOPSY     Patient location during evaluation: PACU Anesthesia Type: MAC Level of consciousness: awake and alert and oriented Pain management: pain level controlled Vital Signs Assessment: post-procedure vital signs reviewed and stable Respiratory status: spontaneous breathing, nonlabored ventilation and respiratory function stable Cardiovascular status: blood pressure returned to baseline Postop Assessment: no apparent nausea or vomiting Anesthetic complications: no   No complications documented.       Brennan Bailey

## 2021-03-12 NOTE — Progress Notes (Signed)
PROGRESS NOTE    Donald Ellis  WUJ:[id number] DOB: December 04, 1934 DOA: 03/11/2021 PCP: Christain Sacramento, MD   Brief Narrative:  This 87 years old male with PMH significant for prostate cancer with mets to the bone presents in the ED with one episode of hematemesis at home in the morning. Patient reports waking up in the morning,  not feeling well, reports dizziness but denies any fall or syncope.  EMS was called,  Patient was hemodynamically stable. He is very hard of hearing.  Patient is not on any blood thinners, based on home medications.  GI was consulted, Patient underwent EGD,  found to have nonbleeding gastric ulcers with visible vessel which was injected, GI recommended to continue PPI, monitor H&H every 6 hours, remain NPO.   Assessment & Plan:   Principal Problem:   Hematemesis Active Problems:   HOH (hard of hearing)  Hematemesis: Patient presented with one episode of hematemesis at home. Patient was found to be hemodynamically stable. Hemoglobin dropped from 11.4-10.4> 9.2 > 8.5 Monitor H&H every 6 hours. Continue PPI drip. Patient was seen by GI,  patient underwent EGD He is found to have nonbleeding gastric ulcers with visible vessel which was injected. IV fluids for hydration. We will keep n.p.o.,  we will resume clear liquid diet tomorrow.  Hard of hearing: Patient is very hard of hearing,  needs hearing aids and audiology referral at discharge.   DVT prophylaxis: SCDs Code Status: Full code. Family Communication:  No family at bedside. Disposition Plan:    Status is: Inpatient  Remains inpatient appropriate because:Inpatient level of care appropriate due to severity of illness   Dispo: The patient is from: Home              Anticipated d/c is to: Home              Patient currently is not medically stable to d/c.   Difficult to place patient No   Consultants:   Gastroenterology  Procedures: EGD Antimicrobials:  Anti-infectives (From admission,  onward)   None      Subjective: Patient was seen and examined at bedside.  He reports feeling better,  grandson was in the room. Overnight events noted. All the questions answered.  Objective: Vitals:   03/12/21 1310 03/12/21 1513 03/12/21 1523 03/12/21 1533  BP: 121/62 (!) 151/69 (!) 143/57 (!) 141/79  Pulse: 100 95 99 97  Resp: 19 (!) 23 14 20   Temp: 98.1 F (36.7 C) 98.2 F (36.8 C)    TempSrc: Oral Oral    SpO2: 98% 97% (!) 89% 95%  Weight:      Height:        Intake/Output Summary (Last 24 hours) at 03/12/2021 1547 Last data filed at 03/12/2021 1507 Gross per 24 hour  Intake 600 ml  Output 450 ml  Net 150 ml   Filed Weights   03/12/21 0755  Weight: 72.6 kg    Examination:  General exam: Appears calm and comfortable , not in any acute distress. Respiratory system: Clear to auscultation. Respiratory effort normal. Cardiovascular system: S1 & S2 heard, RRR. No JVD, murmurs, rubs, gallops or clicks. No pedal edema. Gastrointestinal system: Abdomen is nondistended, soft and nontender. No organomegaly or masses felt.  Normal bowel sounds heard. Central nervous system: Alert and oriented. No focal neurological deficits. Extremities: Symmetric 5 x 5 power, no edema, no cyanosis, no clubbing. Skin: No rashes, lesions or ulcers Psychiatry: Judgement and insight appear normal. Mood & affect appropriate.  Data Reviewed: I have personally reviewed following labs and imaging studies  CBC: Recent Labs  Lab 03/11/21 1832 03/12/21 0000 03/12/21 0242 03/12/21 1043 03/12/21 1503  WBC 14.0*  --  12.7* 10.9*  --   NEUTROABS 10.9*  --   --   --   --   HGB 10.4* 9.2* 9.2* 8.5* 8.5*  HCT 32.1* 28.4* 28.5* 26.4* 25.0*  MCV 101.6*  --  104.4* 101.9*  --   PLT 175  --  163 156  --    Basic Metabolic Panel: Recent Labs  Lab 03/11/21 1832 03/12/21 0242 03/12/21 1503  NA 140 140 142  K 4.4 4.0 4.0  CL 106 108  --   CO2 28 27  --   GLUCOSE 138* 113*  --   BUN 70*  59*  --   CREATININE 0.93 0.84  --   CALCIUM 8.9 8.7*  --    GFR: Estimated Creatinine Clearance: 64.3 mL/min (by C-G formula based on SCr of 0.84 mg/dL). Liver Function Tests: Recent Labs  Lab 03/11/21 1832  AST 15  ALT 9  ALKPHOS 115  BILITOT 0.6  PROT 5.9*  ALBUMIN 2.9*   Recent Labs  Lab 03/11/21 1832  LIPASE 24   No results for input(s): AMMONIA in the last 168 hours. Coagulation Profile: No results for input(s): INR, PROTIME in the last 168 hours. Cardiac Enzymes: No results for input(s): CKTOTAL, CKMB, CKMBINDEX, TROPONINI in the last 168 hours. BNP (last 3 results) No results for input(s): PROBNP in the last 8760 hours. HbA1C: No results for input(s): HGBA1C in the last 72 hours. CBG: No results for input(s): GLUCAP in the last 168 hours. Lipid Profile: No results for input(s): CHOL, HDL, LDLCALC, TRIG, CHOLHDL, LDLDIRECT in the last 72 hours. Thyroid Function Tests: No results for input(s): TSH, T4TOTAL, FREET4, T3FREE, THYROIDAB in the last 72 hours. Anemia Panel: No results for input(s): VITAMINB12, FOLATE, FERRITIN, TIBC, IRON, RETICCTPCT in the last 72 hours. Sepsis Labs: No results for input(s): PROCALCITON, LATICACIDVEN in the last 168 hours.  Recent Results (from the past 240 hour(s))  SARS CORONAVIRUS 2 (TAT 6-24 HRS) Nasopharyngeal Nasopharyngeal Swab     Status: None   Collection Time: 03/11/21 11:20 PM   Specimen: Nasopharyngeal Swab  Result Value Ref Range Status   SARS Coronavirus 2 NEGATIVE NEGATIVE Final    Comment: (NOTE) SARS-CoV-2 target nucleic acids are NOT DETECTED.  The SARS-CoV-2 RNA is generally detectable in upper and lower respiratory specimens during the acute phase of infection. Negative results do not preclude SARS-CoV-2 infection, do not rule out co-infections with other pathogens, and should not be used as the sole basis for treatment or other patient management decisions. Negative results must be combined with clinical  observations, patient history, and epidemiological information. The expected result is Negative.  Fact Sheet for Patients: SugarRoll.be  Fact Sheet for Healthcare Providers: https://www.woods-mathews.com/  This test is not yet approved or cleared by the [place] FDA and  has been authorized for detection and/or diagnosis of SARS-CoV-2 by FDA under an Emergency Use Authorization (EUA). This EUA will remain  in effect (meaning this test can be used) for the duration of the COVID-19 declaration under Se ction 564(b)(1) of the Act, 21 U.S.C. section 360bbb-3(b)(1), unless the authorization is terminated or revoked sooner.  Performed at [facility], [place] [address], [place], [place] [address]     Radiology Studies: CT ABDOMEN PELVIS W CONTRAST  Result Date: 03/11/2021 CLINICAL DATA:  Bloody emesis. EXAM: CT ABDOMEN AND PELVIS WITH CONTRAST TECHNIQUE: Multidetector CT imaging of the abdomen and pelvis was performed using the standard protocol following bolus administration of intravenous contrast. CONTRAST:  174mL OMNIPAQUE IOHEXOL 300 MG/ML  SOLN COMPARISON:  December 15, 2019 FINDINGS: Lower chest: No acute abnormality. Hepatobiliary: There is diffuse fatty infiltration of the liver parenchyma. No focal liver abnormality is seen. No gallstones, gallbladder wall thickening, or biliary dilatation. Pancreas: Unremarkable. No pancreatic ductal dilatation or surrounding inflammatory changes. Spleen: Normal in size without focal abnormality. Adrenals/Urinary Tract: Adrenal glands are unremarkable. Kidneys are normal in size, without renal calculi or hydronephrosis. An 8 mm well-defined focus of parenchymal low attenuation is seen within the posterior aspect of the left kidney. Bladder is unremarkable. Stomach/Bowel: Stomach is within normal limits. Appendix appears normal. No evidence of bowel dilatation. Noninflamed diverticula are seen throughout the  sigmoid colon. Vascular/Lymphatic: Aortic atherosclerosis with stable, approximately 3.3 cm diameter aneurysmal dilatation of the infrarenal abdominal aorta. No enlarged abdominal or pelvic lymph nodes. Reproductive: The prostate gland is markedly enlarged. Other: Small, stable bilateral fat containing inguinal hernias are seen. No evidence of abdominal or pelvic free fluid. Musculoskeletal: Numerous sclerotic foci are again seen scattered throughout the osseous skeleton. Multilevel degenerative changes seen throughout the lumbar spine. IMPRESSION: 1. Hepatic steatosis. 2. Sigmoid diverticulosis. 3. Stable, approximately 3.3 cm diameter infrarenal abdominal aortic aneurysm. 4. Findings consistent with diffuse osseous metastasis. 5. Stable small bilateral fat containing inguinal hernias. 6. Aortic atherosclerosis. Aortic Atherosclerosis (ICD10-I70.0). Electronically Signed   By: Virgina Norfolk M.D.   On: 03/11/2021 20:21   Scheduled Meds: . [START ON 03/15/2021] pantoprazole  40 mg Intravenous Q12H   Continuous Infusions: . sodium chloride 100 mL/hr at 03/12/21 0758  . pantoprozole (PROTONIX) infusion Stopped (03/12/21 1408)     LOS: 0 days    Time spent: 35 mins    Esaias Cleavenger, MD [facility]   If 7PM-7AM, please contact night-coverage

## 2021-03-12 NOTE — Anesthesia Preprocedure Evaluation (Addendum)
Anesthesia Evaluation  Patient identified by MRN, date of birth, ID band Patient awake    Reviewed: Allergy & Precautions, NPO status , Patient's Chart, lab work & pertinent test results  History of Anesthesia Complications Negative for: history of anesthetic complications  Airway Mallampati: II  TM Distance: >3 FB Neck ROM: Full    Dental  (+) Edentulous Lower, Edentulous Upper   Pulmonary former smoker,    Pulmonary exam normal        Cardiovascular negative cardio ROS Normal cardiovascular exam     Neuro/Psych negative neurological ROS  negative psych ROS   GI/Hepatic Neg liver ROS, hematemesis    Endo/Other  negative endocrine ROS  Renal/GU negative Renal ROS  negative genitourinary   Musculoskeletal negative musculoskeletal ROS (+)   Abdominal   Peds  Hematology  (+) anemia , Hgb 8.5   Anesthesia Other Findings Day of surgery medications reviewed with patient.  Reproductive/Obstetrics negative OB ROS                            Anesthesia Physical Anesthesia Plan  ASA: III  Anesthesia Plan: MAC   Post-op Pain Management:    Induction:   PONV Risk Score and Plan: Treatment may vary due to age or medical condition and Propofol infusion  Airway Management Planned: Natural Airway and Nasal Cannula  Additional Equipment: None  Intra-op Plan:   Post-operative Plan:   Informed Consent: I have reviewed the patients History and Physical, chart, labs and discussed the procedure including the risks, benefits and alternatives for the proposed anesthesia with the patient or authorized representative who has indicated his/her understanding and acceptance.       Plan Discussed with: CRNA  Anesthesia Plan Comments:        Anesthesia Quick Evaluation

## 2021-03-12 NOTE — Progress Notes (Signed)
Lance Coon  Code Status: FULL  Donald Ellis is a 87 y.o. male patient admitted from Endo awake, alert - oriented X4 - no acute distress noted. Patient very [place], assistance received from son on admission history. VSS -  no c/o shortness of breath, no c/o chest pain. Cardiac tele in place.  Fall assessment complete, with patient able to verbalize understanding of risk associated with falls, and verbalized understanding to call nursing before up out of bed. Call light within reach, patient able to voice, and demonstrate understanding. Skin, clean-dry- intact with scattered bruising. No evidence of skin break down noted on exam.  ?  Will cont to eval and treat per MD orders.  Melonie Florida, RN  03/12/2021
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
## 2021-03-13 DIAGNOSIS — K254 Chronic or unspecified gastric ulcer with hemorrhage: Secondary | ICD-10-CM | POA: Diagnosis not present

## 2021-03-13 DIAGNOSIS — D62 Acute posthemorrhagic anemia: Secondary | ICD-10-CM | POA: Diagnosis not present

## 2021-03-13 DIAGNOSIS — K920 Hematemesis: Secondary | ICD-10-CM | POA: Diagnosis not present

## 2021-03-13 LAB — COMPREHENSIVE METABOLIC PANEL
ALT: 17 U/L (ref 0–44)
AST: 25 U/L (ref 15–41)
Albumin: 2.6 g/dL — ABNORMAL LOW (ref 3.5–5.0)
Alkaline Phosphatase: 100 U/L (ref 38–126)
Anion gap: 4 — ABNORMAL LOW (ref 5–15)
BUN: 31 mg/dL — ABNORMAL HIGH (ref 8–23)
CO2: 28 mmol/L (ref 22–32)
Calcium: 8.6 mg/dL — ABNORMAL LOW (ref 8.9–10.3)
Chloride: 109 mmol/L (ref 98–111)
Creatinine, Ser: 0.73 mg/dL (ref 0.61–1.24)
GFR, Estimated: >60 mL/min (ref >60)
Glucose, Bld: 99 mg/dL (ref 70–99)
Potassium: 4.3 mmol/L (ref 3.5–5.1)
Sodium: 141 mmol/L (ref 135–145)
Total Bilirubin: 0.4 mg/dL (ref 0.3–1.2)
Total Protein: 5.1 g/dL — ABNORMAL LOW (ref 6.5–8.1)

## 2021-03-13 LAB — CBC
HCT: 24.7 % — ABNORMAL LOW (ref 39.0–52.0)
Hemoglobin: 8.1 g/dL — ABNORMAL LOW (ref 13.0–17.0)
MCH: 33.8 pg (ref 26.0–34.0)
MCHC: 32.8 g/dL (ref 30.0–36.0)
MCV: 102.9 fL — ABNORMAL HIGH (ref 80.0–100.0)
Platelets: 144 10*3/uL — ABNORMAL LOW (ref 150–400)
RBC: 2.4 MIL/uL — ABNORMAL LOW (ref 4.22–5.81)
RDW: 14.6 % (ref 11.5–15.5)
WBC: 9.1 10*3/uL (ref 4.0–10.5)
nRBC: 0 % (ref 0.0–0.2)

## 2021-03-13 LAB — PHOSPHORUS: Phosphorus: 3.3 mg/dL (ref 2.5–4.6)

## 2021-03-13 LAB — HEMOGLOBIN AND HEMATOCRIT, BLOOD
HCT: 25.7 % — ABNORMAL LOW (ref 39.0–52.0)
Hemoglobin: 8.4 g/dL — ABNORMAL LOW (ref 13.0–17.0)

## 2021-03-13 LAB — MAGNESIUM: Magnesium: 1.9 mg/dL (ref 1.7–2.4)

## 2021-03-13 MED ORDER — BOOST / RESOURCE BREEZE PO LIQD CUSTOM
1.0000 | Freq: Three times a day (TID) | ORAL | Status: DC
  Administered 2021-03-13 – 2021-03-15 (×5): 1 via ORAL

## 2021-03-13 NOTE — Progress Notes (Signed)
PROGRESS NOTE    Donald Ellis  PNT:[id number] DOB: 12/02/1934 DOA: 03/11/2021 PCP: Christain Sacramento, MD   Brief Narrative:  This 87 years old male with PMH significant for prostate cancer with mets to the bone presents in the ED with one episode of hematemesis at home in the morning. Patient reports waking up in the morning,  not feeling well, reports dizziness but denies any fall or syncope.  EMS was called,  Patient was hemodynamically stable. He is very hard of hearing.  Patient is not on any blood thinners, based on home medications.  GI was consulted, Patient underwent EGD,  found to have nonbleeding gastric ulcers with visible vessel which was injected, GI recommended to continue PPI, monitor H&H every 6 hours, resume diet and advance as tolerated.   Assessment & Plan:   Principal Problem:   Hematemesis Active Problems:   HOH (hard of hearing)  Hematemesis: Patient presented with one episode of hematemesis at home. Patient was found to be hemodynamically stable. Hemoglobin dropped from 11.4-10.4> 9.2 > 8.5> 8.1 Monitor H&H every 6 hours. Continue PPI drip for 48 - 72 hrs. Patient was seen by GI,  patient underwent EGD He was found to have nonbleeding gastric ulcers with visible vessel which was injected. Awaiting biopsy report and if H. pylori present,  will need treatment for this. IV fluids for hydration. Resume clear liquid diet and advance as tolerated.  Hard of hearing: Patient is very hard of hearing,  needs hearing aids and audiology referral at discharge.  Thrombocytopenia: Platelet 144 today,  remains stable.   DVT prophylaxis: SCDs Code Status: Full code. Family Communication:  No family at bedside. Disposition Plan:    Status is: Inpatient  Remains inpatient appropriate because:Inpatient level of care appropriate due to severity of illness   Dispo: The patient is from: Home              Anticipated d/c is to: Home              Patient currently is not  medically stable to d/c.   Difficult to place patient No   Consultants:   Gastroenterology  Procedures: EGD Antimicrobials:  Anti-infectives (From admission, onward)   None      Subjective: Patient was seen and examined at bedside.  He reports feeling better,  Overnight events noted.  Patient is sitting comfortably,  denies any blood in the vomitus.  Objective: Vitals:   03/13/21 0000 03/13/21 0330 03/13/21 0737 03/13/21 1149  BP: (!) 103/55 (!) 98/54 (!) 114/59 129/63  Pulse: 76 73 77 81  Resp: 18 16 20 14   Temp: (!) 97.4 F (36.3 C) 98.4 F (36.9 C) 98.4 F (36.9 C) 98.4 F (36.9 C)  TempSrc: Oral Oral Oral Oral  SpO2: 92% 100% 100% 98%  Weight:      Height:        Intake/Output Summary (Last 24 hours) at 03/13/2021 1539 Last data filed at 03/13/2021 1528 Gross per 24 hour  Intake 720 ml  Output 2025 ml  Net -1305 ml   Filed Weights   03/12/21 0755  Weight: 72.6 kg    Examination:  General exam: Appears calm and comfortable , not in any acute distress. Respiratory system: Clear to auscultation. Respiratory effort normal. Cardiovascular system: S1 & S2 heard, RRR. No JVD, murmurs, rubs, gallops or clicks. No pedal edema. Gastrointestinal system: Abdomen is nondistended, soft and nontender. No organomegaly or masses felt.  Normal bowel sounds heard. Central nervous system:  Alert and oriented. No focal neurological deficits. Extremities: Symmetric 5 x 5 power, no edema, no cyanosis, no clubbing. Skin: No rashes, lesions or ulcers Psychiatry: Judgement and insight appear normal. Mood & affect appropriate.     Data Reviewed: I have personally reviewed following labs and imaging studies  CBC: Recent Labs  Lab 03/11/21 1832 03/12/21 0000 03/12/21 0242 03/12/21 1043 03/12/21 1503 03/12/21 1820 03/12/21 2158 03/13/21 0235 03/13/21 0555  WBC 14.0*  --  12.7* 10.9*  --   --   --   --  9.1  NEUTROABS 10.9*  --   --   --   --   --   --   --   --   HGB  10.4*   < > 9.2* 8.5* 8.5* 9.2* 8.5* 8.4* 8.1*  HCT 32.1*   < > 28.5* 26.4* 25.0* 28.9* 26.0* 25.7* 24.7*  MCV 101.6*  --  104.4* 101.9*  --   --   --   --  102.9*  PLT 175  --  163 156  --   --   --   --  144*   < > = values in this interval not displayed.   Basic Metabolic Panel: Recent Labs  Lab 03/11/21 1832 03/12/21 0242 03/12/21 1503 03/13/21 0555  NA 140 140 142 141  K 4.4 4.0 4.0 4.3  CL 106 108  --  109  CO2 28 27  --  28  GLUCOSE 138* 113*  --  99  BUN 70* 59*  --  31*  CREATININE 0.93 0.84  --  0.73  CALCIUM 8.9 8.7*  --  8.6*  MG  --   --   --  1.9  PHOS  --   --   --  3.3   GFR: Estimated Creatinine Clearance: 67.5 mL/min (by C-G formula based on SCr of 0.73 mg/dL). Liver Function Tests: Recent Labs  Lab 03/11/21 1832 03/13/21 0555  AST 15 25  ALT 9 17  ALKPHOS 115 100  BILITOT 0.6 0.4  PROT 5.9* 5.1*  ALBUMIN 2.9* 2.6*   Recent Labs  Lab 03/11/21 1832  LIPASE 24   No results for input(s): AMMONIA in the last 168 hours. Coagulation Profile: No results for input(s): INR, PROTIME in the last 168 hours. Cardiac Enzymes: No results for input(s): CKTOTAL, CKMB, CKMBINDEX, TROPONINI in the last 168 hours. BNP (last 3 results) No results for input(s): PROBNP in the last 8760 hours. HbA1C: No results for input(s): HGBA1C in the last 72 hours. CBG: No results for input(s): GLUCAP in the last 168 hours. Lipid Profile: No results for input(s): CHOL, HDL, LDLCALC, TRIG, CHOLHDL, LDLDIRECT in the last 72 hours. Thyroid Function Tests: No results for input(s): TSH, T4TOTAL, FREET4, T3FREE, THYROIDAB in the last 72 hours. Anemia Panel: No results for input(s): VITAMINB12, FOLATE, FERRITIN, TIBC, IRON, RETICCTPCT in the last 72 hours. Sepsis Labs: No results for input(s): PROCALCITON, LATICACIDVEN in the last 168 hours.  Recent Results (from the past 240 hour(s))  SARS CORONAVIRUS 2 (TAT 6-24 HRS) Nasopharyngeal Nasopharyngeal Swab     Status: None    Collection Time: 03/11/21 11:20 PM   Specimen: Nasopharyngeal Swab  Result Value Ref Range Status   SARS Coronavirus 2 NEGATIVE NEGATIVE Final    Comment: (NOTE) SARS-CoV-2 target nucleic acids are NOT DETECTED.  The SARS-CoV-2 RNA is generally detectable in upper and lower respiratory specimens during the acute phase of infection. Negative results do not preclude SARS-CoV-2 infection, do not rule  out co-infections with other pathogens, and should not be used as the sole basis for treatment or other patient management decisions. Negative results must be combined with clinical observations, patient history, and epidemiological information. The expected result is Negative.  Fact Sheet for Patients: SugarRoll.be  Fact Sheet for Healthcare Providers: https://www.woods-mathews.com/  This test is not yet approved or cleared by the [place] FDA and  has been authorized for detection and/or diagnosis of SARS-CoV-2 by FDA under an Emergency Use Authorization (EUA). This EUA will remain  in effect (meaning this test can be used) for the duration of the COVID-19 declaration under Se ction 564(b)(1) of the Act, 21 U.S.C. section 360bbb-3(b)(1), unless the authorization is terminated or revoked sooner.  Performed at [facility], [place] [address] [place], [place] [address]     Radiology Studies: CT ABDOMEN PELVIS W CONTRAST  Result Date: 03/11/2021 CLINICAL DATA:  Bloody emesis. EXAM: CT ABDOMEN AND PELVIS WITH CONTRAST TECHNIQUE: Multidetector CT imaging of the abdomen and pelvis was performed using the standard protocol following bolus administration of intravenous contrast. CONTRAST:  149mL OMNIPAQUE IOHEXOL 300 MG/ML  SOLN COMPARISON:  December 15, 2019 FINDINGS: Lower chest: No acute abnormality. Hepatobiliary: There is diffuse fatty infiltration of the liver parenchyma. No focal liver abnormality is seen. No gallstones, gallbladder wall  thickening, or biliary dilatation. Pancreas: Unremarkable. No pancreatic ductal dilatation or surrounding inflammatory changes. Spleen: Normal in size without focal abnormality. Adrenals/Urinary Tract: Adrenal glands are unremarkable. Kidneys are normal in size, without renal calculi or hydronephrosis. An 8 mm well-defined focus of parenchymal low attenuation is seen within the posterior aspect of the left kidney. Bladder is unremarkable. Stomach/Bowel: Stomach is within normal limits. Appendix appears normal. No evidence of bowel dilatation. Noninflamed diverticula are seen throughout the sigmoid colon. Vascular/Lymphatic: Aortic atherosclerosis with stable, approximately 3.3 cm diameter aneurysmal dilatation of the infrarenal abdominal aorta. No enlarged abdominal or pelvic lymph nodes. Reproductive: The prostate gland is markedly enlarged. Other: Small, stable bilateral fat containing inguinal hernias are seen. No evidence of abdominal or pelvic free fluid. Musculoskeletal: Numerous sclerotic foci are again seen scattered throughout the osseous skeleton. Multilevel degenerative changes seen throughout the lumbar spine. IMPRESSION: 1. Hepatic steatosis. 2. Sigmoid diverticulosis. 3. Stable, approximately 3.3 cm diameter infrarenal abdominal aortic aneurysm. 4. Findings consistent with diffuse osseous metastasis. 5. Stable small bilateral fat containing inguinal hernias. 6. Aortic atherosclerosis. Aortic Atherosclerosis (ICD10-I70.0). Electronically Signed   By: Virgina Norfolk M.D.   On: 03/11/2021 20:21   Scheduled Meds: . [START ON 03/15/2021] pantoprazole  40 mg Intravenous Q12H   Continuous Infusions: . sodium chloride 100 mL/hr at 03/13/21 0438  . pantoprozole (PROTONIX) infusion 8 mg/hr (03/13/21 0731)     LOS: 1 day    Time spent: 25 mins    Gabryela Kimbrell, MD [facility]   If 7PM-7AM, please contact night-coverage

## 2021-03-13 NOTE — Plan of Care (Signed)
  Problem: Activity: Goal: Risk for activity intolerance will decrease Outcome: Progressing   Problem: Coping: Goal: Level of anxiety will decrease Outcome: Progressing   Problem: Nutrition: Goal: Adequate nutrition will be maintained Outcome: Not Progressing

## 2021-03-13 NOTE — Progress Notes (Addendum)
Daily Rounding Note  03/13/2021, 10:25 AM  LOS: 1 day   SUBJECTIVE:   Chief complaint:  Hematemesis.   Feeling quite good.  Denies nausea or vomiting.  He is coughing out dark darkish looking sputum.  No nausea.  No abdominal pain. Chronic infection  OBJECTIVE:         Vital signs in last 24 hours:    Temp:  [97.4 F (36.3 C)-98.6 F (37 C)] 98.4 F (36.9 C) (04/05 0737) Pulse Rate:  [73-100] 77 (04/05 0737) Resp:  [14-23] 20 (04/05 0737) BP: (98-151)/(54-79) 114/59 (04/05 0737) SpO2:  [89 %-100 %] 100 % (04/05 0737) Last BM Date: 03/11/21 Filed Weights   03/12/21 0755  Weight: 72.6 kg   General: Aged, comfortable.  Alert. Heart: RRR. Chest: No labored breathing, not coughing.  Brownish sputum, small amount in spit bag at bedside. Abdomen: Not tender, not distended, active bowel sounds. Extremities: No CCE. Neuro/Psych: Alert.  Appropriate.  Moves all 4 limbs.  No gross weakness or tremors noted.  Intake/Output from previous day: 04/04 0701 - 04/05 0700 In: 600 [I.V.:600] Out: 1050 [Urine:1000; Blood:50]  Intake/Output this shift: Total I/O In: -  Out: 325 [Urine:325]  Lab Results: Recent Labs    03/12/21 0242 03/12/21 1043 03/12/21 1503 03/12/21 2158 03/13/21 0235 03/13/21 0555  WBC 12.7* 10.9*  --   --   --  9.1  HGB 9.2* 8.5*   < > 8.5* 8.4* 8.1*  HCT 28.5* 26.4*   < > 26.0* 25.7* 24.7*  PLT 163 156  --   --   --  144*   < > = values in this interval not displayed.   BMET Recent Labs    03/11/21 1832 03/12/21 0242 03/12/21 1503 03/13/21 0555  NA 140 140 142 141  K 4.4 4.0 4.0 4.3  CL 106 108  --  109  CO2 28 27  --  28  GLUCOSE 138* 113*  --  99  BUN 70* 59*  --  31*  CREATININE 0.93 0.84  --  0.73  CALCIUM 8.9 8.7*  --  8.6*   LFT Recent Labs    03/11/21 1832 03/13/21 0555  PROT 5.9* 5.1*  ALBUMIN 2.9* 2.6*  AST 15 25  ALT 9 17  ALKPHOS 115 100  BILITOT 0.6 0.4    PT/INR No results for input(s): LABPROT, INR in the last 72 hours. Hepatitis Panel No results for input(s): HEPBSAG, HCVAB, HEPAIGM, HEPBIGM in the last 72 hours.  Studies/Results: CT ABDOMEN PELVIS W CONTRAST  Result Date: 03/11/2021 CLINICAL DATA:  Bloody emesis. EXAM: CT ABDOMEN AND PELVIS WITH CONTRAST TECHNIQUE: Multidetector CT imaging of the abdomen and pelvis was performed using the standard protocol following bolus administration of intravenous contrast. CONTRAST:  127mL OMNIPAQUE IOHEXOL 300 MG/ML  SOLN COMPARISON:  December 15, 2019 FINDINGS: Lower chest: No acute abnormality. Hepatobiliary: There is diffuse fatty infiltration of the liver parenchyma. No focal liver abnormality is seen. No gallstones, gallbladder wall thickening, or biliary dilatation. Pancreas: Unremarkable. No pancreatic ductal dilatation or surrounding inflammatory changes. Spleen: Normal in size without focal abnormality. Adrenals/Urinary Tract: Adrenal glands are unremarkable. Kidneys are normal in size, without renal calculi or hydronephrosis. An 8 mm well-defined focus of parenchymal low attenuation is seen within the posterior aspect of the left kidney. Bladder is unremarkable. Stomach/Bowel: Stomach is within normal limits. Appendix appears normal. No evidence of bowel dilatation. Noninflamed diverticula are seen throughout the sigmoid colon.  Vascular/Lymphatic: Aortic atherosclerosis with stable, approximately 3.3 cm diameter aneurysmal dilatation of the infrarenal abdominal aorta. No enlarged abdominal or pelvic lymph nodes. Reproductive: The prostate gland is markedly enlarged. Other: Small, stable bilateral fat containing inguinal hernias are seen. No evidence of abdominal or pelvic free fluid. Musculoskeletal: Numerous sclerotic foci are again seen scattered throughout the osseous skeleton. Multilevel degenerative changes seen throughout the lumbar spine. IMPRESSION: 1. Hepatic steatosis. 2. Sigmoid diverticulosis.  3. Stable, approximately 3.3 cm diameter infrarenal abdominal aortic aneurysm. 4. Findings consistent with diffuse osseous metastasis. 5. Stable small bilateral fat containing inguinal hernias. 6. Aortic atherosclerosis. Aortic Atherosclerosis (ICD10-I70.0). Electronically Signed   By: Virgina Norfolk M.D.   On: 03/11/2021 20:21   Scheduled Meds: . [START ON 03/15/2021] pantoprazole  40 mg Intravenous Q12H   Continuous Infusions: . sodium chloride 100 mL/hr at 03/13/21 0438  . pantoprozole (PROTONIX) infusion 8 mg/hr (03/13/21 0731)   PRN Meds:.acetaminophen **OR** acetaminophen, ondansetron **OR** ondansetron (ZOFRAN) IV  ASSESMENT:   *   Hematemesis.  Acute blood loss anemia. 03/12/2021 EGD: Gastric ulcers.  1 with VV but not, 15 mm in size.  Treated with epinephrine BiCAP cautery.  Multiple nonbleeding superficial gastric ulcers.  Pathology for H. pylori pending.  *   Macrocytic anemia.  Hgb 9.2 >> 8.1.  No transfusion  *    Thrombocytopenia.  Platelets 144 today.  *    Hepatic steatosis, sigmoid diverticulosis, she has some vaginal bleeding as well fat-containing inguinal hernias, stable 3.3 cm infrarenal AAA blood per CT scan.  LFTs normal.   PLAN   *     Complete 48 to 72 hours off PPI drip.  Await findings of biopsy and if H. pylori present will need treatment for this.  72 hours will complete at home 20/200 on 4/6. ?  Advance diet, will discuss with Dr. Loletha Carrow.    *   AM CBC.      Azucena Freed  03/13/2021, 10:25 AM Phone [phone]  I have discussed the case with the PA, and that is the plan I formulated. I personally interviewed and examined the patient.  CC: Bleeding gastric ulcer  He has been stable since yesterday with no overt signs of rebleeding such as hematemesis or melena reported.  Hemoglobin is dropped from 8.5 yesterday afternoon after the procedure to 8.1 this morning.  (I think the 9.2 value last evening was spuriously high)   I will advance him to a full  liquid diet this evening.  We will check his hemoglobin in the morning and if he is stable, advance to regular diet and change from Protonix drip to oral Protonix. Hopefully home in next 1 to 2 days.  Biopsies pending to rule out H. Pylori  Absolutely no aspirin or NSAIDs going forward.  He needs alternate pain control.  [name] Office: [phone]

## 2021-03-14 ENCOUNTER — Encounter (HOSPITAL_COMMUNITY): Payer: Self-pay | Admitting: Gastroenterology

## 2021-03-14 ENCOUNTER — Encounter: Payer: Self-pay | Admitting: Gastroenterology

## 2021-03-14 ENCOUNTER — Other Ambulatory Visit: Payer: Self-pay

## 2021-03-14 DIAGNOSIS — K257 Chronic gastric ulcer without hemorrhage or perforation: Secondary | ICD-10-CM

## 2021-03-14 DIAGNOSIS — K920 Hematemesis: Secondary | ICD-10-CM | POA: Diagnosis not present

## 2021-03-14 DIAGNOSIS — B9681 Helicobacter pylori [H. pylori] as the cause of diseases classified elsewhere: Secondary | ICD-10-CM | POA: Diagnosis not present

## 2021-03-14 LAB — HEMOGLOBIN AND HEMATOCRIT, BLOOD
HCT: 26.8 % — ABNORMAL LOW (ref 39.0–52.0)
Hemoglobin: 8.8 g/dL — ABNORMAL LOW (ref 13.0–17.0)

## 2021-03-14 LAB — CBC
HCT: 23.3 % — ABNORMAL LOW (ref 39.0–52.0)
Hemoglobin: 7.7 g/dL — ABNORMAL LOW (ref 13.0–17.0)
MCH: 33.9 pg (ref 26.0–34.0)
MCHC: 33 g/dL (ref 30.0–36.0)
MCV: 102.6 fL — ABNORMAL HIGH (ref 80.0–100.0)
Platelets: 141 10*3/uL — ABNORMAL LOW (ref 150–400)
RBC: 2.27 MIL/uL — ABNORMAL LOW (ref 4.22–5.81)
RDW: 14.6 % (ref 11.5–15.5)
WBC: 7.7 10*3/uL (ref 4.0–10.5)
nRBC: 0 % (ref 0.0–0.2)

## 2021-03-14 LAB — BASIC METABOLIC PANEL
Anion gap: 4 — ABNORMAL LOW (ref 5–15)
BUN: 21 mg/dL (ref 8–23)
CO2: 31 mmol/L (ref 22–32)
Calcium: 8.4 mg/dL — ABNORMAL LOW (ref 8.9–10.3)
Chloride: 105 mmol/L (ref 98–111)
Creatinine, Ser: 0.78 mg/dL (ref 0.61–1.24)
GFR, Estimated: >60 mL/min (ref >60)
Glucose, Bld: 88 mg/dL (ref 70–99)
Potassium: 3.4 mmol/L — ABNORMAL LOW (ref 3.5–5.1)
Sodium: 140 mmol/L (ref 135–145)

## 2021-03-14 LAB — MAGNESIUM: Magnesium: 1.9 mg/dL (ref 1.7–2.4)

## 2021-03-14 LAB — SURGICAL PATHOLOGY

## 2021-03-14 LAB — MRSA PCR SCREENING: MRSA by PCR: NEGATIVE

## 2021-03-14 LAB — PHOSPHORUS: Phosphorus: 2.4 mg/dL — ABNORMAL LOW (ref 2.5–4.6)

## 2021-03-14 MED ORDER — PANTOPRAZOLE SODIUM 40 MG PO TBEC
40.0000 mg | DELAYED_RELEASE_TABLET | Freq: Two times a day (BID) | ORAL | Status: DC
  Administered 2021-03-14 – 2021-03-15 (×2): 40 mg via ORAL
  Filled 2021-03-14 (×2): qty 1

## 2021-03-14 NOTE — Progress Notes (Signed)
PROGRESS NOTE    Donald Ellis  QMG:[id number] DOB: 1934-03-07 DOA: 03/11/2021 PCP: Christain Sacramento, MD   Brief Narrative:  This 87 years old male with PMH significant for prostate cancer with mets to the bone presents in the ED with one episode of hematemesis at home in the morning. Patient reports waking up in the morning,  not feeling well, reports dizziness but denies any fall or syncope.  EMS was called,  Patient was hemodynamically stable. He is very hard of hearing.  Patient is not on any blood thinners, based on home medications.  GI was consulted, Patient underwent EGD,  found to have nonbleeding gastric ulcers with visible vessel which was injected, GI recommended to continue PPI, monitor H&H every 6 hours, resume diet and advance as tolerated.   Assessment & Plan:   Principal Problem:   Hematemesis Active Problems:   HOH (hard of hearing)  Hematemesis: Patient presented with one episode of hematemesis at home. Patient was found to be hemodynamically stable. Hemoglobin dropped from 11.4-10.4> 9.2 > 8.5> 8.1>7.7 Monitor H&H every 12 hours. Continue PPI drip for 48 - 72 hrs. after change to Protonix 40 bid for 8 weeks. Patient was seen by GI,  patient underwent EGD He was found to have nonbleeding gastric ulcers with visible vessel which was injected. H pylori biopsy+,  Start metronidazole, bismuth subsalicylate and doxycycline for 10 days. Continue IV fluids for hydration. Resume clear liquid diet and advance as tolerated. Patient denies any further episodes of bleeding in the vomitus or in the stool. GI signed off , advised to avoid aspirin and NSAIDs going forward.  Hard of hearing: Patient is very hard of hearing,  needs hearing aids and audiology referral at discharge.  Thrombocytopenia: Platelet 144 today,  remains stable.  Macrocytic anemia: Continue ferrous sulfate daily GI recommended Feraheme infusion. Recheck CBC in the morning.   DVT prophylaxis:  SCDs Code Status: Full code. Family Communication:  No family at bedside. Disposition Plan:    Status is: Inpatient  Remains inpatient appropriate because:Inpatient level of care appropriate due to severity of illness   Dispo: The patient is from: Home              Anticipated d/c is to: Home              Patient currently is not medically stable to d/c.   Difficult to place patient No   Consultants:   Gastroenterology  Procedures: EGD Antimicrobials:  Anti-infectives (From admission, onward)   None      Subjective: Patient was seen and examined at bedside.  He reports feeling better,  Overnight events noted.  Patient is sitting comfortably,  denies any blood in the vomitus.  Objective: Vitals:   03/14/21 0348 03/14/21 0400 03/14/21 0812 03/14/21 1200  BP: 113/64 105/60 130/65 124/70  Pulse: 80  90 95  Resp: 18 (!) 21 20 20   Temp: 97.9 F (36.6 C)  98 F (36.7 C) 98.2 F (36.8 C)  TempSrc: Oral  Oral Oral  SpO2: 91% 94% 96% 98%  Weight:      Height:        Intake/Output Summary (Last 24 hours) at 03/14/2021 1533 Last data filed at 03/14/2021 1423 Gross per 24 hour  Intake 3527.09 ml  Output 1825 ml  Net 1702.09 ml   Filed Weights   03/12/21 0755  Weight: 72.6 kg    Examination:  General exam: Appears calm and comfortable , not in any acute distress. Respiratory  system: Clear to auscultation. Respiratory effort normal. Cardiovascular system: S1 & S2 heard, RRR. No JVD, murmurs, rubs, gallops or clicks. No pedal edema. Gastrointestinal system: Abdomen is nondistended, soft and nontender. No organomegaly or masses felt.  Normal bowel sounds heard. Central nervous system: Alert and oriented. No focal neurological deficits. Extremities: Symmetric 5 x 5 power, no edema, no cyanosis, no clubbing. Skin: No rashes, lesions or ulcers Psychiatry: Judgement and insight appear normal. Mood & affect appropriate.     Data Reviewed: I have personally reviewed  following labs and imaging studies  CBC: Recent Labs  Lab 03/11/21 1832 03/12/21 0000 03/12/21 0242 03/12/21 1043 03/12/21 1503 03/12/21 1820 03/12/21 2158 03/13/21 0235 03/13/21 0555 03/14/21 0053  WBC 14.0*  --  12.7* 10.9*  --   --   --   --  9.1 7.7  NEUTROABS 10.9*  --   --   --   --   --   --   --   --   --   HGB 10.4*   < > 9.2* 8.5*   < > 9.2* 8.5* 8.4* 8.1* 7.7*  HCT 32.1*   < > 28.5* 26.4*   < > 28.9* 26.0* 25.7* 24.7* 23.3*  MCV 101.6*  --  104.4* 101.9*  --   --   --   --  102.9* 102.6*  PLT 175  --  163 156  --   --   --   --  144* 141*   < > = values in this interval not displayed.   Basic Metabolic Panel: Recent Labs  Lab 03/11/21 1832 03/12/21 0242 03/12/21 1503 03/13/21 0555 03/14/21 0053  NA 140 140 142 141 140  K 4.4 4.0 4.0 4.3 3.4*  CL 106 108  --  109 105  CO2 28 27  --  28 31  GLUCOSE 138* 113*  --  99 88  BUN 70* 59*  --  31* 21  CREATININE 0.93 0.84  --  0.73 0.78  CALCIUM 8.9 8.7*  --  8.6* 8.4*  MG  --   --   --  1.9 1.9  PHOS  --   --   --  3.3 2.4*   GFR: Estimated Creatinine Clearance: 67.5 mL/min (by C-G formula based on SCr of 0.78 mg/dL). Liver Function Tests: Recent Labs  Lab 03/11/21 1832 03/13/21 0555  AST 15 25  ALT 9 17  ALKPHOS 115 100  BILITOT 0.6 0.4  PROT 5.9* 5.1*  ALBUMIN 2.9* 2.6*   Recent Labs  Lab 03/11/21 1832  LIPASE 24   No results for input(s): AMMONIA in the last 168 hours. Coagulation Profile: No results for input(s): INR, PROTIME in the last 168 hours. Cardiac Enzymes: No results for input(s): CKTOTAL, CKMB, CKMBINDEX, TROPONINI in the last 168 hours. BNP (last 3 results) No results for input(s): PROBNP in the last 8760 hours. HbA1C: No results for input(s): HGBA1C in the last 72 hours. CBG: No results for input(s): GLUCAP in the last 168 hours. Lipid Profile: No results for input(s): CHOL, HDL, LDLCALC, TRIG, CHOLHDL, LDLDIRECT in the last 72 hours. Thyroid Function Tests: No results for  input(s): TSH, T4TOTAL, FREET4, T3FREE, THYROIDAB in the last 72 hours. Anemia Panel: No results for input(s): VITAMINB12, FOLATE, FERRITIN, TIBC, IRON, RETICCTPCT in the last 72 hours. Sepsis Labs: No results for input(s): PROCALCITON, LATICACIDVEN in the last 168 hours.  Recent Results (from the past 240 hour(s))  SARS CORONAVIRUS 2 (TAT 6-24 HRS) Nasopharyngeal Nasopharyngeal Swab  Status: None   Collection Time: 03/11/21 11:20 PM   Specimen: Nasopharyngeal Swab  Result Value Ref Range Status   SARS Coronavirus 2 NEGATIVE NEGATIVE Final    Comment: (NOTE) SARS-CoV-2 target nucleic acids are NOT DETECTED.  The SARS-CoV-2 RNA is generally detectable in upper and lower respiratory specimens during the acute phase of infection. Negative results do not preclude SARS-CoV-2 infection, do not rule out co-infections with other pathogens, and should not be used as the sole basis for treatment or other patient management decisions. Negative results must be combined with clinical observations, patient history, and epidemiological information. The expected result is Negative.  Fact Sheet for Patients: SugarRoll.be  Fact Sheet for Healthcare Providers: https://www.woods-mathews.com/  This test is not yet approved or cleared by the [place] FDA and  has been authorized for detection and/or diagnosis of SARS-CoV-2 by FDA under an Emergency Use Authorization (EUA). This EUA will remain  in effect (meaning this test can be used) for the duration of the COVID-19 declaration under Se ction 564(b)(1) of the Act, 21 U.S.C. section 360bbb-3(b)(1), unless the authorization is terminated or revoked sooner.  Performed at [facility], [place] [address] [place], [place] [address]   MRSA PCR Screening     Status: None   Collection Time: 03/14/21  3:11 AM   Specimen: Nasal Mucosa; Nasopharyngeal  Result Value Ref Range Status   MRSA by PCR  NEGATIVE NEGATIVE Final    Comment:        The GeneXpert MRSA Assay (FDA approved for NASAL specimens only), is one component of a comprehensive MRSA colonization surveillance program. It is not intended to diagnose MRSA infection nor to guide or monitor treatment for MRSA infections. Performed at [facility], [place] [address] [place],  [address]     Radiology Studies: No results found. Scheduled Meds: . feeding supplement  1 Container Oral TID BM  . pantoprazole  40 mg Oral BID   Continuous Infusions: . sodium chloride 100 mL/hr at 03/14/21 1228  . pantoprozole (PROTONIX) infusion 8 mg/hr (03/14/21 1506)     LOS: 2 days    Time spent: 25 mins    Abbygail Willhoite, MD [facility]   If 7PM-7AM, please contact night-coverage

## 2021-03-14 NOTE — Progress Notes (Signed)
Daily Rounding Note  03/14/2021, 12:06 PM  LOS: 2 days   SUBJECTIVE:   Chief complaint: Upper GI bleed, hematemesis from gastric ulcers.  No BMs for the past couple of days.  He is able to stand up without assistance on his home.  No dizziness.  Tolerating full liquids.  No abdominal pain, no vomiting..  OBJECTIVE:         Vital signs in last 24 hours:    Temp:  [97.6 F (36.4 C)-98.2 F (36.8 C)] 98.2 F (36.8 C) (04/06 1200) Pulse Rate:  [80-95] 95 (04/06 1200) Resp:  [18-26] 20 (04/06 1200) BP: (102-130)/(50-70) 124/70 (04/06 1200) SpO2:  [90 %-98 %] 98 % (04/06 1200) Last BM Date: 03/11/21 Filed Weights   03/12/21 0755  Weight: 72.6 kg   General:   Aged, somewhat frail, not toxic.  Comfortable Heart: RRR Chest: No labored breathing or cough.  Lungs clear bilaterally Abdomen: Not tender, not distended, active bowel sounds.  Soft. Extremities: No CCE. Neuro/Psych: Alert.  Appropriate.  Fully oriented.  Intake/Output from previous day: 04/05 0701 - 04/06 0700 In: 3714.2 [P.O.:1082; I.V.:2632.2] Out: 2400 [Urine:2400]  Intake/Output this shift: Total I/O In: 270 [P.O.:240; I.V.:30] Out: 325 [Urine:325]  Lab Results: Recent Labs    03/12/21 1043 03/12/21 1503 03/13/21 0235 03/13/21 0555 03/14/21 0053  WBC 10.9*  --   --  9.1 7.7  HGB 8.5*   < > 8.4* 8.1* 7.7*  HCT 26.4*   < > 25.7* 24.7* 23.3*  PLT 156  --   --  144* 141*   < > = values in this interval not displayed.   BMET Recent Labs    03/12/21 0242 03/12/21 1503 03/13/21 0555 03/14/21 0053  NA 140 142 141 140  K 4.0 4.0 4.3 3.4*  CL 108  --  109 105  CO2 27  --  28 31  GLUCOSE 113*  --  99 88  BUN 59*  --  31* 21  CREATININE 0.84  --  0.73 0.78  CALCIUM 8.7*  --  8.6* 8.4*   LFT Recent Labs    03/11/21 1832 03/13/21 0555  PROT 5.9* 5.1*  ALBUMIN 2.9* 2.6*  AST 15 25  ALT 9 17  ALKPHOS 115 100  BILITOT 0.6 0.4    PT/INR No results for input(s): LABPROT, INR in the last 72 hours. Hepatitis Panel No results for input(s): HEPBSAG, HCVAB, HEPAIGM, HEPBIGM in the last 72 hours.  Studies/Results: No results found.  ASSESMENT:   *   Hematemesis.  Acute blood loss anemia. 03/12/2021 EGD: Gastric ulcers.  1 with VV but not, 15 mm in size.  Treated with epinephrine BiCAP cautery.  Multiple nonbleeding superficial gastric ulcers.  Pathology for H. pylori pending. 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  *   Macrocytic anemia.  Hgb 9.2 >> 8.1 >> 7.7  No transfusion  *    Thrombocytopenia.  Platelets 144 today.  *    Hepatic steatosis, sigmoid diverticulosis,  fat-containing inguinal hernias, stable 3.3 cm infrarenal AAA blood per CT scan.  LFTs normal.    PLAN   *   IV Protonix drip finishes day at 2200.  Start Protonix 40 mg po bid for at least 8 weeks.. Begin H pylori treatment: Metronidazole 250 mg four times daily x 10 days;  Bismuth subsalicylate 440 mg four times daily x 10 days;  Doxycycline 100 mg twice daily x 10 days;  Begin iron sulfate 325 mg po daily. Consider Feraheme infusion. Advance to regular diet.   *   CBC in the morning.  *    GI will sign off.  Patient advised to avoid aspirin, NSAIDs going forward.  He needs alternate pain control.  *  Has  OV set for May 18 at 2:20 PM with Dr. Loletha Carrow.  At that office visit a future EGD will be scheduled to assess ulcer healing.  *   Should obtain CBC in 10 to 14 days, this is probably most easily and best pursued by his PCP and [place].     Azucena Freed  03/14/2021, 12:06 PM Phone [phone]

## 2021-03-15 LAB — CBC
HCT: 25.1 % — ABNORMAL LOW (ref 39.0–52.0)
Hemoglobin: 8.4 g/dL — ABNORMAL LOW (ref 13.0–17.0)
MCH: 33.9 pg (ref 26.0–34.0)
MCHC: 33.5 g/dL (ref 30.0–36.0)
MCV: 101.2 fL — ABNORMAL HIGH (ref 80.0–100.0)
Platelets: 151 10*3/uL (ref 150–400)
RBC: 2.48 MIL/uL — ABNORMAL LOW (ref 4.22–5.81)
RDW: 14.4 % (ref 11.5–15.5)
WBC: 7.5 10*3/uL (ref 4.0–10.5)
nRBC: 0 % (ref 0.0–0.2)

## 2021-03-15 LAB — BASIC METABOLIC PANEL
Anion gap: 5 (ref 5–15)
BUN: 15 mg/dL (ref 8–23)
CO2: 30 mmol/L (ref 22–32)
Calcium: 8.5 mg/dL — ABNORMAL LOW (ref 8.9–10.3)
Chloride: 105 mmol/L (ref 98–111)
Creatinine, Ser: 0.7 mg/dL (ref 0.61–1.24)
GFR, Estimated: >60 mL/min (ref >60)
Glucose, Bld: 103 mg/dL — ABNORMAL HIGH (ref 70–99)
Potassium: 3.3 mmol/L — ABNORMAL LOW (ref 3.5–5.1)
Sodium: 140 mmol/L (ref 135–145)

## 2021-03-15 LAB — PHOSPHORUS: Phosphorus: 2.6 mg/dL (ref 2.5–4.6)

## 2021-03-15 LAB — MAGNESIUM: Magnesium: 1.8 mg/dL (ref 1.7–2.4)

## 2021-03-15 MED ORDER — PANTOPRAZOLE SODIUM 40 MG PO TBEC: 40.0000 mg | DELAYED_RELEASE_TABLET | Freq: Two times a day (BID) | ORAL | 1 refills | Status: DC

## 2021-03-15 MED ORDER — DOXYCYCLINE HYCLATE 100 MG PO TABS: 100.0000 mg | ORAL_TABLET | Freq: Two times a day (BID) | ORAL | 0 refills | Status: DC

## 2021-03-15 MED ORDER — BISMUTH SUBSALICYLATE 262 MG PO CHEW: 524.0000 mg | CHEWABLE_TABLET | Freq: Four times a day (QID) | ORAL | 0 refills | Status: DC

## 2021-03-15 MED ORDER — METRONIDAZOLE 250 MG PO TABS: 250.0000 mg | ORAL_TABLET | Freq: Four times a day (QID) | ORAL | 0 refills | Status: AC

## 2021-03-15 NOTE — Discharge Summary (Signed)
Physician Discharge Summary  Leory Allinson MWU:[id number] DOB: 18-Aug-1934 DOA: 03/11/2021  PCP: Christain Sacramento, MD  Admit date: 03/11/2021   Discharge date: 03/15/2021  Admitted From: Home.  Disposition:  Home health services.  Recommendations for Outpatient Follow-up:  1. Follow up with PCP in 1-2 weeks. 2. Please obtain BMP/CBC in one week. 3. Advised to follow-up with GI as scheduled. 4. Advised to take H. pylori treatment quadruple therapy for 10 days. 5. Metronidazole 250 mg 4 times daily for 10 days 6. Doxycycline 100 mg twice daily for 10 days. 7. Pantoprazole 40 mg daily,  8. Bismuth subsalicylate 725 mg 4 times daily for 10 days.   Home Health: Home PT Equipment/Devices: None  Discharge Condition: Stable CODE STATUS:Full code Diet recommendation: Heart Healthy  Brief Summary / Hospital course: This 87 years old male with PMH significant for prostate cancer with mets to the bone presents in the ED with one episode of hematemesis at home in the morning. Patient reports waking up in the morning,  not feeling well, reports dizziness but denies any fall or syncope.  He had 1 episode of bloody vomiting. EMS was called,  Patient was hemodynamically stable. He is very hard of hearing. Patient was not on any blood thinners, based on home medications.    Patient was admitted for acute upper GI bleeding.  GI was consulted, Patient underwent EGD,  found to have nonbleeding gastric ulcers with visible vessel which was injected, GI recommended to continue PPI, monitor H&H every 6 hours, resume diet and advance as tolerated.  Patient's hemoglobin remained stable.  Biopsies were positive for H. pylori infection, Patient denies any further bleeding.  Patient is cleared from GI.  Patient tolerated soft bland diet.  Patient is being discharged home on quadruple therapy for 10 days.  Patient will follow up with GI in 2 weeks.  He was managed for below problems.  Discharge Diagnoses:  Principal  Problem:   Hematemesis Active Problems:   HOH (hard of hearing)   Gastric ulcer due to Helicobacter pylori, chronic  Hematemesis: Patient presented with one episode of hematemesis at home. Patient was found to be hemodynamically stable. Hemoglobin dropped from 11.4-10.4> 9.2 > 8.5> 8.1>7.7 > 8.4 Monitor H&H every 12 hours. Continue PPI drip for 48 - 72 hrs. after change to Protonix 40 bid for 8 weeks. Patient was seen by GI,  patient underwent EGD He was found to have nonbleeding gastric ulcers with visible vessel which was injected. H pylori biopsy+,  Start metronidazole, bismuth subsalicylate and doxycycline for 10 days. Continue IV fluids for hydration. Resume clear liquid diet and advance as tolerated. Patient denies any further episodes of bleeding in the vomitus or in the stool. GI signed off , advised to avoid aspirin and NSAIDs going forward.  Hard of hearing: Patient is very hard of hearing,  needs hearing aids and audiology referral at discharge.  Thrombocytopenia: Platelet 144 today,  remains stable.  Macrocytic anemia: Continue ferrous sulfate daily GI recommended Feraheme infusion. Recheck CBC in the morning.  Discharge Instructions  Discharge Instructions    Call MD for:  difficulty breathing, headache or visual disturbances   Complete by: As directed    Call MD for:  persistant dizziness or light-headedness   Complete by: As directed    Call MD for:  persistant nausea and vomiting   Complete by: As directed    Diet - low sodium heart healthy   Complete by: As directed    Diet Carb  Modified   Complete by: As directed    Discharge instructions   Complete by: As directed    Advised to follow-up with PCP in 1 week. Advised to follow-up with GI as scheduled. Advised to take H. pylori treatment quadruple therapy for 10 days. Metronidazole to 50 mg 4 times daily for 10 days Doxycycline 100 mg twice dail for 10 days. Pantoprazole 40 mg daily,  Bismuth  subsalicylate 947 mg 4 times daily for 10 days.  .   Increase activity slowly   Complete by: As directed      Allergies as of 03/15/2021      Reactions   Hydrocodone-acetaminophen Itching   Itching and tingling without a rash      Medication List    STOP taking these medications   HYDROcodone-acetaminophen 5-325 MG tablet Commonly known as: Norco   ipratropium-albuterol 0.5-2.5 (3) MG/3ML Soln Commonly known as: DUONEB   meloxicam 15 MG tablet Commonly known as: MOBIC     TAKE these medications   acetaminophen 650 MG CR tablet Commonly known as: TYLENOL Take 650 mg by mouth every 8 (eight) hours as needed for pain.   albuterol 108 (90 Base) MCG/ACT inhaler Commonly known as: VENTOLIN HFA Inhale 2 puffs into the lungs every 4 (four) hours as needed for wheezing or shortness of breath.   bismuth subsalicylate 654 MG chewable tablet Commonly known as: PEPTO BISMOL Chew 2 tablets (524 mg total) by mouth in the morning, at noon, in the evening, and at bedtime.   diphenhydramine-acetaminophen 25-500 MG Tabs tablet Commonly known as: TYLENOL PM Take 1 tablet by mouth at bedtime.   doxycycline 100 MG tablet Commonly known as: VIBRA-TABS Take 1 tablet (100 mg total) by mouth 2 (two) times daily.   metroNIDAZOLE 250 MG tablet Commonly known as: Flagyl Take 1 tablet (250 mg total) by mouth 4 (four) times daily for 10 days.   pantoprazole 40 MG tablet Commonly known as: PROTONIX Take 1 tablet (40 mg total) by mouth 2 (two) times daily.   Symbicort 160-4.5 MCG/ACT inhaler Generic drug: budesonide-formoterol Inhale 1 puff into the lungs 2 (two) times daily.   tamsulosin 0.4 MG Caps capsule Commonly known as: FLOMAX Take 0.4 mg by mouth 2 (two) times daily.   Vitamin D3 125 MCG (5000 UT) Tabs Take 5,000 Units by mouth daily.       Follow-up Information    Doran Stabler, MD Follow up on 04/25/2021.   Specialty: Gastroenterology Why: 2:20 PM follow-up with  gastrointestinal doctor. Contact information: [address] [place] [address] [phone]        Christain Sacramento, MD Follow up in 1 week(s).   Specialty: Family Medicine Contact information: [address] [place] [address] [phone]              Allergies  Allergen Reactions  . Hydrocodone-Acetaminophen Itching    Itching and tingling without a rash    Consultations:  Gastroenterology   Procedures/Studies: CT ABDOMEN PELVIS W CONTRAST  Result Date: 03/11/2021 CLINICAL DATA:  Bloody emesis. EXAM: CT ABDOMEN AND PELVIS WITH CONTRAST TECHNIQUE: Multidetector CT imaging of the abdomen and pelvis was performed using the standard protocol following bolus administration of intravenous contrast. CONTRAST:  159mL OMNIPAQUE IOHEXOL 300 MG/ML  SOLN COMPARISON:  December 15, 2019 FINDINGS: Lower chest: No acute abnormality. Hepatobiliary: There is diffuse fatty infiltration of the liver parenchyma. No focal liver abnormality is seen. No gallstones, gallbladder wall thickening, or  biliary dilatation. Pancreas: Unremarkable. No pancreatic ductal dilatation or surrounding inflammatory changes. Spleen: Normal in size without focal abnormality. Adrenals/Urinary Tract: Adrenal glands are unremarkable. Kidneys are normal in size, without renal calculi or hydronephrosis. An 8 mm well-defined focus of parenchymal low attenuation is seen within the posterior aspect of the left kidney. Bladder is unremarkable. Stomach/Bowel: Stomach is within normal limits. Appendix appears normal. No evidence of bowel dilatation. Noninflamed diverticula are seen throughout the sigmoid colon. Vascular/Lymphatic: Aortic atherosclerosis with stable, approximately 3.3 cm diameter aneurysmal dilatation of the infrarenal abdominal aorta. No enlarged abdominal or pelvic lymph nodes. Reproductive: The prostate gland is markedly enlarged. Other: Small, stable bilateral fat containing inguinal hernias  are seen. No evidence of abdominal or pelvic free fluid. Musculoskeletal: Numerous sclerotic foci are again seen scattered throughout the osseous skeleton. Multilevel degenerative changes seen throughout the lumbar spine. IMPRESSION: 1. Hepatic steatosis. 2. Sigmoid diverticulosis. 3. Stable, approximately 3.3 cm diameter infrarenal abdominal aortic aneurysm. 4. Findings consistent with diffuse osseous metastasis. 5. Stable small bilateral fat containing inguinal hernias. 6. Aortic atherosclerosis. Aortic Atherosclerosis (ICD10-I70.0). Electronically Signed   By: Virgina Norfolk M.D.   On: 03/11/2021 20:21    EGD.   Subjective: Seen and examined at bedside.  Overnight events noted.  Patient reports feeling better,   Patient wants to be discharged.  Patient is being discharged home.  Discharge Exam: Vitals:   03/14/21 2014 03/15/21 0437  BP: (!) 123/58 (!) 143/73  Pulse: 90 77  Resp: 18 20  Temp: (!) 97.4 F (36.3 C) 98 F (36.7 C)  SpO2: 92% 92%   Vitals:   03/14/21 0812 03/14/21 1200 03/14/21 2014 03/15/21 0437  BP: 130/65 124/70 (!) 123/58 (!) 143/73  Pulse: 90 95 90 77  Resp: 20 20 18 20   Temp: 98 F (36.7 C) 98.2 F (36.8 C) (!) 97.4 F (36.3 C) 98 F (36.7 C)  TempSrc: Oral Oral Oral Oral  SpO2: 96% 98% 92% 92%  Weight:      Height:        General: Pt is alert, awake, not in acute distress Cardiovascular: RRR, S1/S2 +, no rubs, no gallops Respiratory: CTA bilaterally, no wheezing, no rhonchi Abdominal: Soft, NT, ND, bowel sounds + Extremities: no edema, no cyanosis    The results of significant diagnostics from this hospitalization (including imaging, microbiology, ancillary and laboratory) are listed below for reference.     Microbiology: Recent Results (from the past 240 hour(s))  SARS CORONAVIRUS 2 (TAT 6-24 HRS) Nasopharyngeal Nasopharyngeal Swab     Status: None   Collection Time: 03/11/21 11:20 PM   Specimen: Nasopharyngeal Swab  Result Value Ref  Range Status   SARS Coronavirus 2 NEGATIVE NEGATIVE Final    Comment: (NOTE) SARS-CoV-2 target nucleic acids are NOT DETECTED.  The SARS-CoV-2 RNA is generally detectable in upper and lower respiratory specimens during the acute phase of infection. Negative results do not preclude SARS-CoV-2 infection, do not rule out co-infections with other pathogens, and should not be used as the sole basis for treatment or other patient management decisions. Negative results must be combined with clinical observations, patient history, and epidemiological information. The expected result is Negative.  Fact Sheet for Patients: SugarRoll.be  Fact Sheet for Healthcare Providers: https://www.woods-mathews.com/  This test is not yet approved or cleared by the [place] FDA and  has been authorized for detection and/or diagnosis of SARS-CoV-2 by FDA under an Emergency Use Authorization (EUA). This EUA will remain  in effect (meaning this  test can be used) for the duration of the COVID-19 declaration under Se ction 564(b)(1) of the Act, 21 U.S.C. section 360bbb-3(b)(1), unless the authorization is terminated or revoked sooner.  Performed at [facility], [place] [address], [place], [place] [address]   MRSA PCR Screening     Status: None   Collection Time: 03/14/21  3:11 AM   Specimen: Nasal Mucosa; Nasopharyngeal  Result Value Ref Range Status   MRSA by PCR NEGATIVE NEGATIVE Final    Comment:        The GeneXpert MRSA Assay (FDA approved for NASAL specimens only), is one component of a comprehensive MRSA colonization surveillance program. It is not intended to diagnose MRSA infection nor to guide or monitor treatment for MRSA infections. Performed at [facility], [place] [address] [place], [place] [address]      Labs: BNP (last 3 results) No results for input(s): BNP in the last 8760 hours. Basic Metabolic Panel: Recent Labs  Lab  03/11/21 1832 03/12/21 0242 03/12/21 1503 03/13/21 0555 03/14/21 0053 03/15/21 0339  NA 140 140 142 141 140 140  K 4.4 4.0 4.0 4.3 3.4* 3.3*  CL 106 108  --  109 105 105  CO2 28 27  --  28 31 30   GLUCOSE 138* 113*  --  99 88 103*  BUN 70* 59*  --  31* 21 15  CREATININE 0.93 0.84  --  0.73 0.78 0.70  CALCIUM 8.9 8.7*  --  8.6* 8.4* 8.5*  MG  --   --   --  1.9 1.9 1.8  PHOS  --   --   --  3.3 2.4* 2.6   Liver Function Tests: Recent Labs  Lab 03/11/21 1832 03/13/21 0555  AST 15 25  ALT 9 17  ALKPHOS 115 100  BILITOT 0.6 0.4  PROT 5.9* 5.1*  ALBUMIN 2.9* 2.6*   Recent Labs  Lab 03/11/21 1832  LIPASE 24   No results for input(s): AMMONIA in the last 168 hours. CBC: Recent Labs  Lab 03/11/21 1832 03/12/21 0000 03/12/21 0242 03/12/21 1043 03/12/21 1503 03/13/21 0235 03/13/21 0555 03/14/21 0053 03/14/21 1613 03/15/21 0339  WBC 14.0*  --  12.7* 10.9*  --   --  9.1 7.7  --  7.5  NEUTROABS 10.9*  --   --   --   --   --   --   --   --   --   HGB 10.4*   < > 9.2* 8.5*   < > 8.4* 8.1* 7.7* 8.8* 8.4*  HCT 32.1*   < > 28.5* 26.4*   < > 25.7* 24.7* 23.3* 26.8* 25.1*  MCV 101.6*  --  104.4* 101.9*  --   --  102.9* 102.6*  --  101.2*  PLT 175  --  163 156  --   --  144* 141*  --  151   < > = values in this interval not displayed.   Cardiac Enzymes: No results for input(s): CKTOTAL, CKMB, CKMBINDEX, TROPONINI in the last 168 hours. BNP: Invalid input(s): POCBNP CBG: No results for input(s): GLUCAP in the last 168 hours. D-Dimer No results for input(s): DDIMER in the last 72 hours. Hgb A1c No results for input(s): HGBA1C in the last 72 hours. Lipid Profile No results for input(s): CHOL, HDL, LDLCALC, TRIG, CHOLHDL, LDLDIRECT in the last 72 hours. Thyroid function studies No results for input(s): TSH, T4TOTAL, T3FREE, THYROIDAB in the last 72 hours.  Invalid  input(s): FREET3 Anemia work up No results for input(s): VITAMINB12, FOLATE, FERRITIN, TIBC, IRON,  RETICCTPCT in the last 72 hours. Urinalysis    Component Value Date/Time   COLORURINE YELLOW 03/11/2021 2312   APPEARANCEUR CLEAR 03/11/2021 2312   LABSPEC >1.046 (H) 03/11/2021 2312   PHURINE 5.0 03/11/2021 2312   GLUCOSEU NEGATIVE 03/11/2021 2312   HGBUR SMALL (A) 03/11/2021 2312   BILIRUBINUR NEGATIVE 03/11/2021 2312   KETONESUR 5 (A) 03/11/2021 2312   PROTEINUR NEGATIVE 03/11/2021 2312   NITRITE NEGATIVE 03/11/2021 2312   LEUKOCYTESUR NEGATIVE 03/11/2021 2312   Sepsis Labs Invalid input(s): PROCALCITONIN,  WBC,  LACTICIDVEN Microbiology Recent Results (from the past 240 hour(s))  SARS CORONAVIRUS 2 (TAT 6-24 HRS) Nasopharyngeal Nasopharyngeal Swab     Status: None   Collection Time: 03/11/21 11:20 PM   Specimen: Nasopharyngeal Swab  Result Value Ref Range Status   SARS Coronavirus 2 NEGATIVE NEGATIVE Final    Comment: (NOTE) SARS-CoV-2 target nucleic acids are NOT DETECTED.  The SARS-CoV-2 RNA is generally detectable in upper and lower respiratory specimens during the acute phase of infection. Negative results do not preclude SARS-CoV-2 infection, do not rule out co-infections with other pathogens, and should not be used as the sole basis for treatment or other patient management decisions. Negative results must be combined with clinical observations, patient history, and epidemiological information. The expected result is Negative.  Fact Sheet for Patients: SugarRoll.be  Fact Sheet for Healthcare Providers: https://www.woods-mathews.com/  This test is not yet approved or cleared by the [place] FDA and  has been authorized for detection and/or diagnosis of SARS-CoV-2 by FDA under an Emergency Use Authorization (EUA). This EUA will remain  in effect (meaning this test can be used) for the duration of the COVID-19 declaration under Se ction 564(b)(1) of the Act, 21 U.S.C. section 360bbb-3(b)(1), unless the authorization  is terminated or revoked sooner.  Performed at [facility], [place] [address], [place], [place] [address]   MRSA PCR Screening     Status: None   Collection Time: 03/14/21  3:11 AM   Specimen: Nasal Mucosa; Nasopharyngeal  Result Value Ref Range Status   MRSA by PCR NEGATIVE NEGATIVE Final    Comment:        The GeneXpert MRSA Assay (FDA approved for NASAL specimens only), is one component of a comprehensive MRSA colonization surveillance program. It is not intended to diagnose MRSA infection nor to guide or monitor treatment for MRSA infections. Performed at [facility], [place] [address], [place], [place] [address]      Time coordinating discharge: Over 30 minutes  SIGNED:   Shawna Clamp, MD  [facility] 03/15/2021, 12:26 PM Pager   If 7PM-7AM, please contact night-coverage [email or web address]

## 2021-03-15 NOTE — Progress Notes (Signed)
Patient discharging home. Vital signs stable at time of discharge as reflected in discharge summary. Discharge instructions given to patient and daughter., verbal understanding returned. Patient to follow up with PCP within a week and scheduled gastro appointment. No questions at this time

## 2021-03-15 NOTE — Discharge Instructions (Signed)
Advised to follow-up with PCP in 1 week. Advised to follow-up with GI as scheduled. Advised to take H. pylori treatment quadruple therapy for 10 days. Metronidazole to 50 mg 4 times daily for 10 days Doxycycline 100 mg twice dail for 10 days. Pantoprazole 40 mg daily,  Bismuth subsalicylate 390 mg 4 times daily for 10 days.

## 2021-03-15 NOTE — Evaluation (Addendum)
Occupational Therapy Evaluation/Discharge Patient Details Name: Donald Ellis MRN: [id number] DOB: October 04, 1934 Today's Date: 03/15/2021    History of Present Illness This 87 years old male with PMH significant for prostate cancer with mets to the bone presents in the ED with one episode of hematemesis at home in the morning. Patient reports waking up in the morning,  not feeling well, reports dizziness but denies any fall or syncope. GI was consulted, Patient underwent EGD,  found to have nonbleeding gastric ulcers.   Clinical Impression   PTA, pt lives with spouse whom he assists prn (he and daughter assist with bathing tasks). Pt reports Independence in all ADLs, IADLs and mobility without AD. Pt still drives, grocery shops and enjoys yard work. Pt presents now with mild deficits in balance and endurance that improved with increased activity during session. Pt overall min guard at most for mobility without AD and sponge bathing tasks standing at sink > 10 minutes. Pt able to bend to feet without LOB and denies fatigue. Pt reports he has access to a RW or cane if needed after discharge. Anticipate pt to return quickly to PLOF and no further skilled OT services needed at this time. OT to sign off.     Follow Up Recommendations  No OT follow up;Supervision - Intermittent    Equipment Recommendations  None recommended by OT    Recommendations for Other Services       Precautions / Restrictions Precautions Precautions: Fall;Other (comment) Precaution Comments: very HOH Restrictions Weight Bearing Restrictions: No      Mobility Bed Mobility Overal bed mobility: Modified Independent                  Transfers Overall transfer level: Needs assistance Equipment used: None Transfers: Sit to/from Stand;Stand Pivot Transfers Sit to Stand: Min guard Stand pivot transfers: Min guard       General transfer comment: min guard for safety with initial unsteadiness that improved during  session to supervision without AD    Balance Overall balance assessment: Needs assistance Sitting-balance support: No upper extremity supported;Feet supported Sitting balance-Leahy Scale: Good     Standing balance support: No upper extremity supported;During functional activity Standing balance-Leahy Scale: Good                             ADL either performed or assessed with clinical judgement   ADL Overall ADL's : Needs assistance/impaired Eating/Feeding: Independent;Sitting   Grooming: Supervision/safety;Standing;Wash/dry face   Upper Body Bathing: Supervision/ safety;Standing   Lower Body Bathing: Min guard;Sit to/from stand Lower Body Bathing Details (indicate cue type and reason): min guard for safety in bending to reach B feet in standing but no overt LOB Upper Body Dressing : Independent;Sitting   Lower Body Dressing: Supervision/safety;Sit to/from stand   Toilet Transfer: Min guard;Ambulation   Toileting- Clothing Manipulation and Hygiene: Supervision/safety;Sit to/from stand;Sitting/lateral lean       Functional mobility during ADLs: Min guard General ADL Comments: Pt reports not moving around out of bed in 3 days. initially mildly unsteady but improved with increased activity. No use of AD for sponge bathing tasks standing at sink, no overt LOB. Pt denies fatigue     Vision Baseline Vision/History: Wears glasses Patient Visual Report: No change from baseline Vision Assessment?: No apparent visual deficits     Perception     Praxis      Pertinent Vitals/Pain Pain Assessment: Faces Faces Pain Scale: Hurts a little  bit Pain Location: R knee Pain Descriptors / Indicators: Sore Pain Intervention(s): Monitored during session     Hand Dominance Right   Extremity/Trunk Assessment Upper Extremity Assessment Upper Extremity Assessment: Overall WFL for tasks assessed   Lower Extremity Assessment Lower Extremity Assessment: Defer to PT  evaluation   Cervical / Trunk Assessment Cervical / Trunk Assessment: Normal   Communication Communication Communication: HOH   Cognition Arousal/Alertness: Awake/alert Behavior During Therapy: WFL for tasks assessed/performed Overall Cognitive Status: Within Functional Limits for tasks assessed                                     General Comments  VSS on RA. HR up to 120s with bathing task, pt denies fatigue    Exercises     Shoulder Instructions      Home Living Family/patient expects to be discharged to:: Private residence Living Arrangements: Spouse/significant other Available Help at Discharge: Family;Available PRN/intermittently Type of Home: House Home Access: Stairs to enter [employer] of Steps: 2   Home Layout: One level     Bathroom Shower/Tub: Tub/shower unit         Home Equipment: Environmental consultant - 2 wheels;Cane - single point;Shower seat;Grab bars - tub/shower   Additional Comments: Lives with wife whom he assists, reports she may have early dementia. Daughter assists as needed with IADLs too      Prior Functioning/Environment Level of Independence: Independent        Comments: no use of AD for mobility. Independent with ADLs, IADLs, still drives and grocery shops. Enjoys gardening and yard work. Pt and daughter assist wife with bathing tasks, etc.        OT Problem List:        OT Treatment/Interventions:      OT Goals(Current goals can be found in the care plan section) Acute Rehab OT Goals Patient Stated Goal: go home today, get back to gardening OT Goal Formulation: All assessment and education complete, DC therapy  OT Frequency:     Barriers to D/C:            Co-evaluation              AM-PAC OT "6 Clicks" Daily Activity     Outcome Measure Help from another person eating meals?: None Help from another person taking care of personal grooming?: None Help from another person toileting, which includes  using toliet, bedpan, or urinal?: A Little Help from another person bathing (including washing, rinsing, drying)?: A Little Help from another person to put on and taking off regular upper body clothing?: None Help from another person to put on and taking off regular lower body clothing?: A Little 6 Click Score: 21   End of Session Equipment Utilized During Treatment: Gait belt Nurse Communication: Mobility status;Other (comment) (pt request for coffee)  Activity Tolerance: Patient tolerated treatment well Patient left: in chair;with call bell/phone within reach;with chair alarm set;Other (comment) (chair pad and alarm active though not connected to cord that calls to nurses station)  OT Visit Diagnosis: Unsteadiness on feet (R26.81)                Time: 2836-6294 OT Time Calculation (min): 40 min Charges:  OT General Charges $OT Visit: 1 Visit OT Evaluation $OT Eval Low Complexity: 1 Low OT Treatments $Self Care/Home Management : 23-37 mins  Malachy Chamber, OTR/L Acute Rehab Services Office: [phone]  Almyra Free  Seniah Lawrence 03/15/2021, 11:04 AM

## 2021-03-15 NOTE — Care Management Important Message (Signed)
Important Message  Patient Details  Name: Donald Ellis MRN: [id number] Date of Birth: 1934/10/18   Medicare Important Message Given:  Yes     [name] 03/15/2021, 12:45 PM

## 2021-03-15 NOTE — TOC Initial Note (Addendum)
Transition of Care [facility]) - Initial/Assessment Note    Patient Details  Name: Donald Ellis MRN: [id number] Date of Birth: 1934/07/24  Transition of Care [facility]) CM/SW Contact:    Ninfa Meeker, RN Phone Number: 03/15/2021, 12:45 PM  Clinical Narrative:  Patient is very [place].   Case manager spoke with patient's daughter- Donald Ellis [phone] (C), concerning recommendation for Home Health therapy. Choice for [place] offered, she states no preference, they have never had to use Home Health. Referral called to Adela Lank, [facility] Liaison. Collie Siad is to be contacted to arrange therapy dates. Patient lives home with his wife. No further needs identified.                             Expected Discharge Plan: [place] Barriers to Discharge: No Barriers Identified   Patient Goals and CMS Choice   CMS Medicare.gov Compare Post Acute Care list provided to:: Patient Represenative (must comment) Choice offered to / list presented to : Adult Children (Daughter: Donald Ellis)  Expected Discharge Plan and Services Expected Discharge Plan: [place]   Discharge Planning Services: CM Consult Post Acute Care Choice: [place] arrangements for the past 2 months: Single Family Home Expected Discharge Date: 03/15/21               DME Arranged: N/A DME Agency: NA       HH Arranged: PT [place] Agency: [place] Date [facility] Agency Contacted: 03/15/21 Time [place]: 1244 Representative spoke with at [place]: Adela Lank  Prior Living Arrangements/Services Living arrangements for the past 2 months: [place] Lives with:: Spouse Patient language and need for interpreter reviewed:: Yes        Need for Family Participation in Patient Care: Yes (Comment) Care giver support system in place?: Yes (comment)   Criminal Activity/Legal Involvement Pertinent to Current Situation/Hospitalization: No - Comment as  needed  Activities of Daily Living Home Assistive Devices/Equipment: Eyeglasses ADL Screening (condition at time of admission) Patient's cognitive ability adequate to safely complete daily activities?: Yes Is the patient deaf or have difficulty hearing?: Yes Does the patient have difficulty seeing, even when wearing glasses/contacts?: No Does the patient have difficulty concentrating, remembering, or making decisions?: Yes Patient able to express need for assistance with ADLs?: Yes Does the patient have difficulty dressing or bathing?: No Independently performs ADLs?: Yes (appropriate for developmental age) Does the patient have difficulty walking or climbing stairs?: Yes Weakness of Legs: None Weakness of Arms/Hands: None  Permission Sought/Granted                  Emotional Assessment         Alcohol / Substance Use: Not Applicable Psych Involvement: No (comment)  Admission diagnosis:  Hematemesis [K92.0] Hematemesis with nausea [K92.0] Patient Active Problem List   Diagnosis Date Noted  . 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 Hematemesis 03/11/2021  . HOH (hard of hearing) 03/11/2021  . BPH (benign prostatic hyperplasia) 08/30/2019   PCP:  Christain Sacramento, MD Pharmacy:   [id number] - [place], [place] - [address] [place] [address] Phone: [phone] Fax: [phone]     Social Determinants of Health (SDOH) Interventions    Readmission Risk Interventions No flowsheet data found.

## 2021-03-15 NOTE — Progress Notes (Signed)
03/15/21 1208  PT Evaluation Information  Last PT Received On 03/15/21  Assistance Needed +1  History of Present Illness This 87 year old male with PMH significant for prostate cancer with mets to the bone presents in the ED on 4/3 with one episode of hematemesis at home in the morning. Patient reports waking up in the morning,  not feeling well, reports dizziness but denies any fall or syncope. GI was consulted, Patient underwent EGD,  found to have nonbleeding gastric ulcers.  Precautions  Precautions Fall;Other (comment)  Precaution Comments very HOH  Restrictions  Weight Bearing Restrictions No  Home Living  Family/patient expects to be discharged to: Private residence  Living Arrangements Spouse/significant other  Available Help at Discharge Family;Available PRN/intermittently  Type of Home House  Home Access Stairs to enter  Entrance Stairs-Number of Steps 2  Home Layout One level  Bathroom Shower/Tub Tub/shower unit  [place] - 2 wheels;Cane - single point;Shower seat;Grab bars - tub/shower  Additional Comments Lives with wife whom he assists, reports she may have early dementia. Daughter assists as needed with IADLs too  Prior Function  Level of Independence Independent  Comments no use of AD for mobility. Independent with ADLs, IADLs, still drives and grocery shops. Enjoys gardening and yard work. Pt and daughter assist wife with bathing tasks, etc.  Communication  Communication HOH  Pain Assessment  Pain Assessment Faces  Faces Pain Scale 2  Pain Location R knee  Pain Descriptors / Indicators Sore  Pain Intervention(s) Limited activity within patient's tolerance;Monitored during session;Repositioned  Cognition  Arousal/Alertness Awake/alert  Behavior During Therapy WFL for tasks assessed/performed  Overall Cognitive Status Within Functional Limits for tasks assessed  Upper Extremity Assessment  Upper Extremity Assessment Defer to OT evaluation  Lower  Extremity Assessment  Lower Extremity Assessment Generalized weakness  Cervical / Trunk Assessment  Cervical / Trunk Assessment Normal  Bed Mobility  General bed mobility comments In chair upon entry  Transfers  Overall transfer level Needs assistance  Equipment used None  Transfers Sit to/from Stand  Sit to Stand Min guard  General transfer comment min guard for safety. Mild unsteadiness noted.  Ambulation/Gait  Ambulation/Gait assistance Min guard  Gait Distance (Feet) 100 Feet  Assistive device IV Pole  Gait Pattern/deviations Step-through pattern;Decreased stride length;Trunk flexed  General Gait Details Mild unsteadiness noted, however, no overt LOB. Slow gait speed with short steps.  Gait velocity Decreased  Balance  Overall balance assessment Needs assistance  Sitting-balance support No upper extremity supported;Feet supported  Sitting balance-Leahy Scale Good  Standing balance support No upper extremity supported;During functional activity  Standing balance-Leahy Scale Fair  PT - End of Session  Equipment Utilized During Treatment Gait belt  Activity Tolerance Patient tolerated treatment well  Patient left in chair;with call bell/phone within reach;with chair alarm set  Nurse Communication Mobility status  PT Assessment  PT Recommendation/Assessment Patient needs continued PT services  PT Visit Diagnosis Unsteadiness on feet (R26.81);Muscle weakness (generalized) (M62.81)  PT Problem List Decreased strength;Decreased balance;Decreased mobility;Decreased activity tolerance  PT Plan  PT Frequency (ACUTE ONLY) Min 3X/week  PT Treatment/Interventions (ACUTE ONLY) DME instruction;Gait training;Stair training;Functional mobility training;Therapeutic activities;Therapeutic exercise;Balance training;Patient/family education  AM-PAC PT "6 Clicks" Mobility Outcome Measure (Version 2)  Help needed turning from your back to your side while in a flat bed without using bedrails? 4   Help needed moving from lying on your back to sitting on the side of a flat bed without using bedrails? 4  Help needed moving to and from a bed to a chair (including a wheelchair)? 3  Help needed standing up from a chair using your arms (e.g., wheelchair or bedside chair)? 3  Help needed to walk in hospital room? 3  Help needed climbing 3-5 steps with a railing?  2  6 Click Score 19  Consider Recommendation of Discharge To: Home with [facility]  PT Recommendation  Follow Up Recommendations Home health PT  PT equipment None recommended by PT  Individuals Consulted  Consulted and Agree with Results and Recommendations Patient  Acute Rehab PT Goals  Patient Stated Goal go home today, get back to gardening  PT Goal Formulation With patient  Time For Goal Achievement 03/29/21  Potential to Achieve Goals Good  PT Time Calculation  PT Start Time (ACUTE ONLY) 1135  PT Stop Time (ACUTE ONLY) 1150  PT Time Calculation (min) (ACUTE ONLY) 15 min  PT General Charges  $$ ACUTE PT VISIT 1 Visit  PT Evaluation  $PT Eval Low Complexity 1 Low  Written Expression  Dominant Hand Right    Pt admitted secondary to problem above with deficits above. Pt with mild unsteadiness during gait, requiring min guard A for stability. No overt LOB noted. Educated about use of DME to increase safety at home and feel he would benefit from HHPT at d/c to increase independence and safety. Reports daughter checks on he and his wife. Will continue to follow acutely.   Reuel Derby, PT, DPT  Acute Rehabilitation Services  Pager: [phone] Office: [phone]

## 2021-04-25 ENCOUNTER — Encounter: Payer: Self-pay | Admitting: Gastroenterology

## 2021-04-25 ENCOUNTER — Ambulatory Visit (INDEPENDENT_AMBULATORY_CARE_PROVIDER_SITE_OTHER): Payer: Medicare Other | Admitting: Gastroenterology

## 2021-04-25 ENCOUNTER — Other Ambulatory Visit (INDEPENDENT_AMBULATORY_CARE_PROVIDER_SITE_OTHER): Payer: Medicare Other

## 2021-04-25 VITALS — BP 122/64 | HR 80 | Ht 67.5 in | Wt 154.0 lb

## 2021-04-25 DIAGNOSIS — K257 Chronic gastric ulcer without hemorrhage or perforation: Secondary | ICD-10-CM

## 2021-04-25 DIAGNOSIS — K254 Chronic or unspecified gastric ulcer with hemorrhage: Secondary | ICD-10-CM | POA: Diagnosis not present

## 2021-04-25 DIAGNOSIS — B9681 Helicobacter pylori [H. pylori] as the cause of diseases classified elsewhere: Secondary | ICD-10-CM

## 2021-04-25 DIAGNOSIS — D62 Acute posthemorrhagic anemia: Secondary | ICD-10-CM

## 2021-04-25 LAB — CBC WITH DIFFERENTIAL/PLATELET
Basophils Absolute: 0.1 10*3/uL (ref 0.0–0.1)
Basophils Relative: 1.3 % (ref 0.0–3.0)
Eosinophils Absolute: 0.3 10*3/uL (ref 0.0–0.7)
Eosinophils Relative: 4.5 % (ref 0.0–5.0)
HCT: 31.2 % — ABNORMAL LOW (ref 39.0–52.0)
Hemoglobin: 10.4 g/dL — ABNORMAL LOW (ref 13.0–17.0)
Lymphocytes Relative: 31.7 % (ref 12.0–46.0)
Lymphs Abs: 2.2 10*3/uL (ref 0.7–4.0)
MCHC: 33.4 g/dL (ref 30.0–36.0)
MCV: 90.3 fl (ref 78.0–100.0)
Monocytes Absolute: 0.8 10*3/uL (ref 0.1–1.0)
Monocytes Relative: 11.1 % (ref 3.0–12.0)
Neutro Abs: 3.6 10*3/uL (ref 1.4–7.7)
Neutrophils Relative %: 51.4 % (ref 43.0–77.0)
Platelets: 235 10*3/uL (ref 150.0–400.0)
RBC: 3.45 Mil/uL — ABNORMAL LOW (ref 4.22–5.81)
RDW: 16.3 % — ABNORMAL HIGH (ref 11.5–15.5)
WBC: 6.9 10*3/uL (ref 4.0–10.5)

## 2021-04-25 LAB — VITAMIN B12: Vitamin B-12: 240 pg/mL (ref 211–911)

## 2021-04-25 LAB — IBC + FERRITIN
Ferritin: 19.5 ng/mL — ABNORMAL LOW (ref 22.0–322.0)
Iron: 23 ug/dL — ABNORMAL LOW (ref 42–165)
Saturation Ratios: 5.8 % — ABNORMAL LOW (ref 20.0–50.0)
Transferrin: 284 mg/dL (ref 212.0–360.0)

## 2021-04-25 LAB — FOLATE: Folate: 12.4 ng/mL (ref >5.9)

## 2021-04-25 MED ORDER — PANTOPRAZOLE SODIUM 40 MG PO TBEC: 40.0000 mg | DELAYED_RELEASE_TABLET | Freq: Two times a day (BID) | ORAL | 1 refills | Status: DC

## 2021-04-25 NOTE — Progress Notes (Signed)
[facility] Progress Note  Chief Complaint: Bleeding gastric ulcer  Subjective  History: [name] was seen in follow-up after hospitalization in early April for upper GI bleed with hematemesis melena and anemia.  He was taking Mobic daily for the pain of metastatic prostate cancer. EGD on 03/12/2021 revealed multiple gastric ulcers, largest about 15 mm with a visible vessel requiring endoscopic hemostasis. Gastric biopsy returned positive for H. pylori prior to discharge, so he was treated with bismuth based quadruple therapy and twice daily pantoprazole. No post hospital primary care records available today. Discharge hemoglobin was 8.4 with an MCV of 101 on 03/15/2021 ____________________________  Donald Ellis was here with his daughter, who assists with history because he is quite hard of hearing. She tells me that her father has been feeling quite well since the hospital discharge, "the best in years".  He took antibiotics as prescribed and a month of pantoprazole, but did not refill it because he was feeling well. He has not had hematemesis, black or bloody stool since hospital discharge.  His appetite is reportedly good and weight stable.  ROS: Cardiovascular:  no chest pain Respiratory: no dyspnea Remainder of systems difficult to obtain due to patient's marked hearing difficulty The patient's Past Medical, Family and Social History were reviewed and are on file in the EMR.  Objective:  Med list reviewed  Current Outpatient Medications:  .  acetaminophen (TYLENOL) 650 MG CR tablet, Take 650 mg by mouth every 8 (eight) hours as needed for pain., Disp: , Rfl:  .  albuterol (VENTOLIN HFA) 108 (90 Base) MCG/ACT inhaler, Inhale 2 puffs into the lungs every 4 (four) hours as needed for wheezing or shortness of breath., Disp: , Rfl:  .  Cholecalciferol (VITAMIN D3) 125 MCG (5000 UT) TABS, Take 5,000 Units by mouth daily., Disp: , Rfl:  .  diphenhydramine-acetaminophen (TYLENOL PM)  25-500 MG TABS tablet, Take 1 tablet by mouth at bedtime., Disp: , Rfl:  .  SYMBICORT 160-4.5 MCG/ACT inhaler, Inhale 1 puff into the lungs 2 (two) times daily., Disp: , Rfl:  .  tamsulosin (FLOMAX) 0.4 MG CAPS capsule, Take 0.4 mg by mouth 2 (two) times daily., Disp: , Rfl:    Vital signs in last 24 hrs: Vitals:   04/25/21 1439  BP: 122/64  Pulse: 80   Wt Readings from Last 3 Encounters:  04/25/21 154 lb (69.9 kg)  03/12/21 160 lb (72.6 kg)  08/30/19 151 lb (68.5 kg)    Physical Exam  Well-appearing.  Ambulates with a cane, gets on exam table without assistance  HEENT: sclera anicteric, oral mucosa moist without lesions  Neck: supple, no thyromegaly, JVD or lymphadenopathy  Cardiac: RRR without murmurs, S1S2 heard, no peripheral edema  Pulm: clear to auscultation bilaterally, normal RR and effort noted  Abdomen: soft, no tenderness, with active bowel sounds. No guarding or palpable hepatosplenomegaly.  Skin; warm and dry, no jaundice or rash  Labs:   ___________________________________________ Radiologic studies:   ____________________________________________ Other:   _____________________________________________ Assessment & Plan  Assessment: Encounter Diagnoses  Name Primary?  . Gastric ulcer due to Helicobacter pylori, chronic Yes  . Acute blood loss anemia    Bleeding gastric ulcer treated endoscopically, caused by combination of NSAID and H. pylori infection.  He has no longer been on Mobic or other NSAIDs, but unfortunately has also not seen primary care since hospital discharge.  Therefore, he has not had any blood work done either.  His anemia is a combination of  chronic macrocytic production problem from metastatic prostate cancer to the bones as well as acute blood loss from his gastric ulcer last month.   Plan: Pantoprazole 40 mg twice daily before meals for 8 weeks Upper endoscopy in 6 to 8 weeks to assess ulcer healing and biopsy to confirm H.  pylori eradication.  The benefits and risks of the planned procedure were described in detail with the patient or (when appropriate) their health care proxy.  Risks were outlined as including, but not limited to, bleeding, infection, perforation, adverse medication reaction leading to cardiac or pulmonary decompensation, pancreatitis (if ERCP).  The limitation of incomplete mucosal visualization was also discussed.  No guarantees or warranties were given. (Daughter present for entire visit and assisted with procedure consent and instructions)  Labs today: CBC, iron studies, B12 and folate.  We will contact his daughter with the results and start supplements as needed.  32 minutes were spent on this encounter (including chart review, history/exam, counseling/coordination of care, and documentation) > 50% of that time was spent on counseling and coordination of care.  Topics discussed included: Causes of gastric ulcer, need for antibiotic therapy and prolonged course of PPI and repeat upper endoscopy.  We also discussed anemia and possible need for supplement based on lab results.  Donald Ellis

## 2021-04-25 NOTE — Patient Instructions (Addendum)
If you are age 85 or older, your body mass index should be between 23-30. Your Body mass index is 23.76 kg/m. If this is out of the aforementioned range listed, please consider follow up with your Primary Care Provider.  If you are age 61 or younger, your body mass index should be between 19-25. Your Body mass index is 23.76 kg/m. If this is out of the aformentioned range listed, please consider follow up with your Primary Care Provider.   Your provider has requested that you go to the basement level for lab work before leaving today. Press "B" on the elevator. The lab is located at the first door on the left as you exit the elevator.  Due to recent changes in healthcare laws, you may see the results of your imaging and laboratory studies on MyChart before your provider has had a chance to review them.  We understand that in some cases there may be results that are confusing or concerning to you. Not all laboratory results come back in the same time frame and the provider may be waiting for multiple results in order to interpret others.  Please give [place] 48 hours in order for your provider to thoroughly review all the results before contacting the office for clarification of your results.   It was a pleasure to see you today!  Thank you for trusting me with your gastrointestinal care!

## 2021-05-18 ENCOUNTER — Other Ambulatory Visit: Payer: Self-pay | Admitting: Gastroenterology

## 2021-06-14 ENCOUNTER — Encounter: Payer: Medicare Other | Admitting: Gastroenterology

## 2021-06-14 ENCOUNTER — Telehealth: Payer: Self-pay | Admitting: Gastroenterology

## 2021-06-14 DIAGNOSIS — K257 Chronic gastric ulcer without hemorrhage or perforation: Secondary | ICD-10-CM

## 2021-06-14 DIAGNOSIS — D62 Acute posthemorrhagic anemia: Secondary | ICD-10-CM

## 2021-06-14 NOTE — Telephone Encounter (Signed)
Understood, thanks.  --------------------------------  Herbert Seta,    Please call this patient and his daughter to reschedule the EGD with me - next available in [place].  The day he comes for that, I would also like him to have a CBC. Iron,TIBC and ferritin draw at our lab when he arrives.  - HD

## 2021-06-14 NOTE — Telephone Encounter (Signed)
Spoke with patient's daughter Donald Ellis to reschedule procedure, she states that she will need to check with her mom to make sure that the patient does not have any other scheduled appts. Advised that patient's spouse can call back to schedule if that is their preference. Donald Ellis verbalized understanding of all information and had no concerns at the end of the call.

## 2021-06-22 NOTE — Telephone Encounter (Signed)
Attempted to reach patient twice, phone rings and then a prompt states "Please enter your remote access code."   Attempted to reach [facility], automated message states " Your call can't be completed as dialed.   I have been unable to leave a voicemail.

## 2021-06-28 NOTE — Telephone Encounter (Signed)
Attempted to contact patient, male answer but phone had interference and she hung up the phone second attempt no answer.

## 2021-07-02 NOTE — Telephone Encounter (Signed)
Understood - thank you for the effort.  We will wait to hear from them if he decides to proceed.  - HD

## 2021-07-02 NOTE — Telephone Encounter (Signed)
I spoke with the patient's wife then Donald Ellis.  She declined to schedule labs or EGD with me.  "I don't know that he is going to want to do that".  I tried to schedule an office visit to come in and discuss.  She again declined to schedule.  "I will talk with him and call back if he wants to schedule"

## 2021-07-21 ENCOUNTER — Other Ambulatory Visit: Payer: Self-pay | Admitting: Gastroenterology

## 2021-07-26 ENCOUNTER — Other Ambulatory Visit: Payer: Self-pay | Admitting: Specialist

## 2021-07-26 DIAGNOSIS — M79602 Pain in left arm: Secondary | ICD-10-CM

## 2021-07-26 DIAGNOSIS — M25512 Pain in left shoulder: Secondary | ICD-10-CM

## 2021-08-30 ENCOUNTER — Inpatient Hospital Stay (HOSPITAL_COMMUNITY)
Admission: EM | Admit: 2021-08-30 | Discharge: 2021-09-02 | DRG: 871 | Disposition: A | Discharge status: Home Health | Payer: Medicare Other | Attending: Internal Medicine | Admitting: Internal Medicine

## 2021-08-30 ENCOUNTER — Other Ambulatory Visit: Payer: Self-pay

## 2021-08-30 ENCOUNTER — Encounter (HOSPITAL_COMMUNITY): Payer: Self-pay | Admitting: *Deleted

## 2021-08-30 ENCOUNTER — Emergency Department (HOSPITAL_COMMUNITY): Payer: Medicare Other

## 2021-08-30 DIAGNOSIS — Z9079 Acquired absence of other genital organ(s): Secondary | ICD-10-CM

## 2021-08-30 DIAGNOSIS — Z87891 Personal history of nicotine dependence: Secondary | ICD-10-CM | POA: Diagnosis not present

## 2021-08-30 DIAGNOSIS — D638 Anemia in other chronic diseases classified elsewhere: Secondary | ICD-10-CM | POA: Diagnosis present

## 2021-08-30 DIAGNOSIS — E871 Hypo-osmolality and hyponatremia: Secondary | ICD-10-CM | POA: Diagnosis present

## 2021-08-30 DIAGNOSIS — C7951 Secondary malignant neoplasm of bone: Secondary | ICD-10-CM | POA: Diagnosis present

## 2021-08-30 DIAGNOSIS — A419 Sepsis, unspecified organism: Principal | ICD-10-CM | POA: Diagnosis present

## 2021-08-30 DIAGNOSIS — Z885 Allergy status to narcotic agent status: Secondary | ICD-10-CM

## 2021-08-30 DIAGNOSIS — E872 Acidosis: Secondary | ICD-10-CM | POA: Diagnosis present

## 2021-08-30 DIAGNOSIS — Z79899 Other long term (current) drug therapy: Secondary | ICD-10-CM | POA: Diagnosis not present

## 2021-08-30 DIAGNOSIS — W1830XA Fall on same level, unspecified, initial encounter: Secondary | ICD-10-CM | POA: Diagnosis present

## 2021-08-30 DIAGNOSIS — U071 COVID-19: Secondary | ICD-10-CM | POA: Diagnosis present

## 2021-08-30 DIAGNOSIS — J1282 Pneumonia due to coronavirus disease 2019: Secondary | ICD-10-CM | POA: Diagnosis not present

## 2021-08-30 DIAGNOSIS — Z7951 Long term (current) use of inhaled steroids: Secondary | ICD-10-CM

## 2021-08-30 DIAGNOSIS — W19XXXA Unspecified fall, initial encounter: Secondary | ICD-10-CM

## 2021-08-30 DIAGNOSIS — Z8546 Personal history of malignant neoplasm of prostate: Secondary | ICD-10-CM

## 2021-08-30 DIAGNOSIS — J440 Chronic obstructive pulmonary disease with acute lower respiratory infection: Secondary | ICD-10-CM | POA: Diagnosis present

## 2021-08-30 DIAGNOSIS — J189 Pneumonia, unspecified organism: Secondary | ICD-10-CM | POA: Diagnosis present

## 2021-08-30 DIAGNOSIS — R6520 Severe sepsis without septic shock: Secondary | ICD-10-CM | POA: Diagnosis present

## 2021-08-30 DIAGNOSIS — R0902 Hypoxemia: Secondary | ICD-10-CM | POA: Diagnosis present

## 2021-08-30 DIAGNOSIS — N400 Enlarged prostate without lower urinary tract symptoms: Secondary | ICD-10-CM | POA: Diagnosis present

## 2021-08-30 DIAGNOSIS — H9190 Unspecified hearing loss, unspecified ear: Secondary | ICD-10-CM | POA: Diagnosis present

## 2021-08-30 LAB — LACTIC ACID, PLASMA
Lactic Acid, Venous: 2.4 mmol/L (ref 0.5–1.9)
Lactic Acid, Venous: 2.5 mmol/L (ref 0.5–1.9)
Lactic Acid, Venous: 5.2 mmol/L (ref 0.5–1.9)
Lactic Acid, Venous: 2.6 mmol/L (ref 0.5–1.9)
Lactic Acid, Venous: 1.7 mmol/L (ref 0.5–1.9)

## 2021-08-30 LAB — COMPREHENSIVE METABOLIC PANEL
ALT: 17 U/L (ref 0–44)
AST: 33 U/L (ref 15–41)
Albumin: 3.1 g/dL — ABNORMAL LOW (ref 3.5–5.0)
Alkaline Phosphatase: 229 U/L — ABNORMAL HIGH (ref 38–126)
Anion gap: 13 (ref 5–15)
BUN: 28 mg/dL — ABNORMAL HIGH (ref 8–23)
CO2: 25 mmol/L (ref 22–32)
Calcium: 8.9 mg/dL (ref 8.9–10.3)
Chloride: 94 mmol/L — ABNORMAL LOW (ref 98–111)
Creatinine, Ser: 0.96 mg/dL (ref 0.61–1.24)
GFR, Estimated: >60 mL/min (ref >60)
Glucose, Bld: 108 mg/dL — ABNORMAL HIGH (ref 70–99)
Potassium: 4 mmol/L (ref 3.5–5.1)
Sodium: 132 mmol/L — ABNORMAL LOW (ref 135–145)
Total Bilirubin: 1.1 mg/dL (ref 0.3–1.2)
Total Protein: 7 g/dL (ref 6.5–8.1)

## 2021-08-30 LAB — CBC WITH DIFFERENTIAL/PLATELET
Abs Immature Granulocytes: 0.08 10*3/uL — ABNORMAL HIGH (ref 0.00–0.07)
Basophils Absolute: 0 10*3/uL (ref 0.0–0.1)
Basophils Relative: 0 %
Eosinophils Absolute: 0 10*3/uL (ref 0.0–0.5)
Eosinophils Relative: 0 %
HCT: 42.7 % (ref 39.0–52.0)
Hemoglobin: 14.4 g/dL (ref 13.0–17.0)
Immature Granulocytes: 1 %
Lymphocytes Relative: 5 %
Lymphs Abs: 0.7 10*3/uL (ref 0.7–4.0)
MCH: 32.9 pg (ref 26.0–34.0)
MCHC: 33.7 g/dL (ref 30.0–36.0)
MCV: 97.5 fL (ref 80.0–100.0)
Monocytes Absolute: 1.2 10*3/uL — ABNORMAL HIGH (ref 0.1–1.0)
Monocytes Relative: 10 %
Neutro Abs: 10.7 10*3/uL — ABNORMAL HIGH (ref 1.7–7.7)
Neutrophils Relative %: 84 %
Platelets: 152 10*3/uL (ref 150–400)
RBC: 4.38 MIL/uL (ref 4.22–5.81)
RDW: 15.5 % (ref 11.5–15.5)
WBC: 12.7 10*3/uL — ABNORMAL HIGH (ref 4.0–10.5)
nRBC: 0 % (ref 0.0–0.2)

## 2021-08-30 LAB — URINALYSIS, ROUTINE W REFLEX MICROSCOPIC
Bacteria, UA: NONE SEEN
Bilirubin Urine: NEGATIVE
Glucose, UA: NEGATIVE mg/dL
Ketones, ur: 5 mg/dL — AB
Leukocytes,Ua: NEGATIVE
Nitrite: NEGATIVE
Protein, ur: NEGATIVE mg/dL
Specific Gravity, Urine: 1.021 (ref 1.005–1.030)
pH: 5 (ref 5.0–8.0)

## 2021-08-30 LAB — RESP PANEL BY RT-PCR (FLU A&B, COVID) ARPGX2
Influenza A by PCR: NEGATIVE
Influenza B by PCR: NEGATIVE
SARS Coronavirus 2 by RT PCR: POSITIVE — AB

## 2021-08-30 LAB — CBG MONITORING, ED: Glucose-Capillary: 114 mg/dL — ABNORMAL HIGH (ref 70–99)

## 2021-08-30 LAB — C-REACTIVE PROTEIN: CRP: 15.2 mg/dL — ABNORMAL HIGH (ref <1.0)

## 2021-08-30 LAB — APTT: aPTT: 28 seconds (ref 24–36)

## 2021-08-30 LAB — FERRITIN: Ferritin: 132 ng/mL (ref 24–336)

## 2021-08-30 LAB — PROTIME-INR
INR: 1.1 (ref 0.8–1.2)
Prothrombin Time: 13.9 seconds (ref 11.4–15.2)

## 2021-08-30 LAB — D-DIMER, QUANTITATIVE: D-Dimer, Quant: 2.34 ug/mL-FEU — ABNORMAL HIGH (ref 0.00–0.50)

## 2021-08-30 LAB — MRSA NEXT GEN BY PCR, NASAL: MRSA by PCR Next Gen: NOT DETECTED

## 2021-08-30 LAB — STREP PNEUMONIAE URINARY ANTIGEN: Strep Pneumo Urinary Antigen: NEGATIVE

## 2021-08-30 MED ORDER — TRAMADOL HCL 50 MG PO TABS
50.0000 mg | ORAL_TABLET | Freq: Four times a day (QID) | ORAL | Status: DC | PRN
  Administered 2021-09-01: 50 mg via ORAL
  Filled 2021-08-30: qty 1

## 2021-08-30 MED ORDER — METRONIDAZOLE 500 MG/100ML IV SOLN
500.0000 mg | Freq: Once | INTRAVENOUS | Status: AC
  Administered 2021-08-30: 500 mg via INTRAVENOUS
  Filled 2021-08-30: qty 100

## 2021-08-30 MED ORDER — PANTOPRAZOLE SODIUM 40 MG PO TBEC
40.0000 mg | DELAYED_RELEASE_TABLET | Freq: Two times a day (BID) | ORAL | Status: DC
  Administered 2021-08-30 – 2021-09-02 (×7): 40 mg via ORAL
  Filled 2021-08-30 (×7): qty 1

## 2021-08-30 MED ORDER — DEXAMETHASONE 4 MG PO TABS
6.0000 mg | ORAL_TABLET | ORAL | Status: DC
  Administered 2021-08-30 – 2021-09-01 (×3): 6 mg via ORAL
  Filled 2021-08-30 (×3): qty 2

## 2021-08-30 MED ORDER — SODIUM CHLORIDE 0.9 % IV SOLN
2.0000 g | Freq: Two times a day (BID) | INTRAVENOUS | Status: DC
  Administered 2021-08-31: 2 g via INTRAVENOUS
  Filled 2021-08-30: qty 2

## 2021-08-30 MED ORDER — SODIUM CHLORIDE 0.9 % IV SOLN
2.0000 g | Freq: Three times a day (TID) | INTRAVENOUS | Status: DC
  Administered 2021-08-30: 2 g via INTRAVENOUS
  Filled 2021-08-30: qty 2

## 2021-08-30 MED ORDER — TAMSULOSIN HCL 0.4 MG PO CAPS
0.4000 mg | ORAL_CAPSULE | Freq: Two times a day (BID) | ORAL | Status: DC
  Administered 2021-08-30 – 2021-09-02 (×7): 0.4 mg via ORAL
  Filled 2021-08-30 (×7): qty 1

## 2021-08-30 MED ORDER — LACTATED RINGERS IV BOLUS
1000.0000 mL | Freq: Once | INTRAVENOUS | Status: AC
  Administered 2021-08-30: 1000 mL via INTRAVENOUS

## 2021-08-30 MED ORDER — SODIUM CHLORIDE 0.9 % IV SOLN
2.0000 g | Freq: Once | INTRAVENOUS | Status: AC
  Administered 2021-08-30: 2 g via INTRAVENOUS
  Filled 2021-08-30: qty 2

## 2021-08-30 MED ORDER — LACTATED RINGERS IV SOLN: INTRAVENOUS | Status: AC

## 2021-08-30 MED ORDER — ACETAMINOPHEN 650 MG RE SUPP: 650.0000 mg | Freq: Four times a day (QID) | RECTAL | Status: DC | PRN

## 2021-08-30 MED ORDER — MOMETASONE FURO-FORMOTEROL FUM 200-5 MCG/ACT IN AERO
2.0000 | INHALATION_SPRAY | Freq: Two times a day (BID) | RESPIRATORY_TRACT | Status: DC
  Administered 2021-09-01 (×2): 2 via RESPIRATORY_TRACT
  Filled 2021-08-30 (×2): qty 8.8

## 2021-08-30 MED ORDER — ACETAMINOPHEN 325 MG PO TABS
650.0000 mg | ORAL_TABLET | Freq: Once | ORAL | Status: AC
  Administered 2021-08-30: 650 mg via ORAL
  Filled 2021-08-30: qty 2

## 2021-08-30 MED ORDER — ALBUTEROL SULFATE (2.5 MG/3ML) 0.083% IN NEBU: 3.0000 mL | INHALATION_SOLUTION | RESPIRATORY_TRACT | Status: DC | PRN

## 2021-08-30 MED ORDER — VANCOMYCIN HCL 1250 MG/250ML IV SOLN
1250.0000 mg | INTRAVENOUS | Status: DC
  Filled 2021-08-30: qty 250

## 2021-08-30 MED ORDER — ENOXAPARIN SODIUM 40 MG/0.4ML IJ SOSY
40.0000 mg | PREFILLED_SYRINGE | INTRAMUSCULAR | Status: DC
  Administered 2021-08-30 – 2021-09-01 (×3): 40 mg via SUBCUTANEOUS
  Filled 2021-08-30 (×3): qty 0.4

## 2021-08-30 MED ORDER — ACETAMINOPHEN 325 MG PO TABS
650.0000 mg | ORAL_TABLET | Freq: Four times a day (QID) | ORAL | Status: DC | PRN
  Filled 2021-08-30: qty 2

## 2021-08-30 MED ORDER — VANCOMYCIN HCL 1250 MG/250ML IV SOLN
1250.0000 mg | Freq: Once | INTRAVENOUS | Status: AC
  Administered 2021-08-30: 1250 mg via INTRAVENOUS
  Filled 2021-08-30: qty 250

## 2021-08-30 NOTE — ED Triage Notes (Signed)
Patient presents to ed via [facility] from home, states patient fell last pm no obv. Injuries from fall, family got him up.Patient is very [place].

## 2021-08-30 NOTE — Progress Notes (Signed)
Elink is following Code Sepsis bundle. °

## 2021-08-30 NOTE — ED Provider Notes (Signed)
[facility] EMERGENCY DEPARTMENT Provider Note   CSN: [id number] Arrival date & time: 08/30/21  1011     History Chief Complaint  Patient presents with   Weakness    Donald Ellis is a 87 y.o. male presenting for evaluation of fever and weakness.  Level 5 caveat as patient is extremely hard of hearing and unable to participate in the history. History provided by patient's son.   Patient lives with his wife and his daughter.  Per son, patient has been feeling "not well" this week.  Son does not know in what way patient has not been feeling well.  He states patient had a fever on Wednesday.  He states patient fell yesterday, unknown what caused the fall or if he had any injury from the fall.  He is not on any blood thinners.  Today patient was having difficulty getting around, thus family decided to bring him to the ER. Per son, patient normally ambulates without assistance.  He does not wear oxygen at baseline.  Additional history obtained from chart review.  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 pylori, chronic    Hematemesis 03/11/2021   HOH (hard of hearing) 03/11/2021   BPH (benign prostatic hyperplasia) 08/30/2019    Past Surgical History:  Procedure Laterality Date   BIOPSY  03/12/2021   Procedure: BIOPSY;  Surgeon: Doran Stabler, MD;  Location: [place];  Service: Gastroenterology;;   CYSTOSCOPY WITH INSERTION OF UROLIFT N/A 07/01/2019   Procedure: CYSTOSCOPY WITH INSERTION OF UROLIFT;  Surgeon: Cleon Gustin, MD;  Location: [facility];  Service: Urology;  Laterality: N/A;   ESOPHAGOGASTRODUODENOSCOPY (EGD) WITH PROPOFOL N/A  03/12/2021   Procedure: ESOPHAGOGASTRODUODENOSCOPY (EGD) WITH PROPOFOL;  Surgeon: Doran Stabler, MD;  Location: [place];  Service: Gastroenterology;  Laterality: N/A;   HOT HEMOSTASIS N/A 03/12/2021   Procedure: HOT HEMOSTASIS (ARGON PLASMA COAGULATION/BICAP);  Surgeon: Doran Stabler, MD;  Location: [place];  Service: Gastroenterology;  Laterality: N/A;   SCLEROTHERAPY  03/12/2021   Procedure: SCLEROTHERAPY;  Surgeon: Doran Stabler, MD;  Location: [place];  Service: Gastroenterology;;   TRANSURETHRAL RESECTION OF PROSTATE N/A 08/30/2019   Procedure: TRANSURETHRAL RESECTION OF THE PROSTATE (TURP);  Surgeon: Cleon Gustin, MD;  Location: [facility];  Service: Urology;  Laterality: N/A;  2 HRS       Family History  Problem Relation Age of Onset   GI Bleed Neg Hx     Social History   Tobacco Use   Smoking status: Former   Smokeless tobacco: Never   Tobacco comments:    Hasn't smoked in 40 years  Vaping Use   Vaping Use: Never used  Substance Use Topics   Alcohol use: Not Currently   Drug use: Never    Home Medications Prior to Admission medications   Medication Sig Start Date End Date Taking? Authorizing Provider  acetaminophen (TYLENOL) 650 MG CR tablet Take 650 mg by mouth every 8 (eight) hours as needed for pain.    [provider]  albuterol (VENTOLIN HFA) 108 (90 Base)  MCG/ACT inhaler Inhale 2 puffs into the lungs every 4 (four) hours as needed for wheezing or shortness of breath. 02/26/21   [provider]  Cholecalciferol (VITAMIN D3) 125 MCG (5000 UT) TABS Take 5,000 Units by mouth daily.    [provider]  diphenhydramine-acetaminophen (TYLENOL PM) 25-500 MG TABS tablet Take 1 tablet by mouth at bedtime.    [provider]  pantoprazole (PROTONIX) 40 MG tablet TAKE 1 TABLET BY MOUTH TWICE A DAY 07/23/21   Doran Stabler, MD  SYMBICORT 160-4.5 MCG/ACT inhaler Inhale 1 puff into the lungs 2  (two) times daily. 01/24/21   [provider]  tamsulosin (FLOMAX) 0.4 MG CAPS capsule Take 0.4 mg by mouth 2 (two) times daily. 09/21/20   [provider]    Allergies    Hydrocodone-acetaminophen  Review of Systems   Review of Systems  Unable to perform ROS: Other  Constitutional:  Positive for fever.  Neurological:  Positive for weakness.   Physical Exam Updated Vital Signs BP (!) 103/56   Pulse 91   Temp 99.7 F (37.6 C) (Oral)   Resp (!) 31   Ht 5\' 10"  (1.778 m)   Wt 69.9 kg   SpO2 93%   BMI 22.11 kg/m   Physical Exam Vitals and nursing note reviewed. Exam conducted with a chaperone present.  Constitutional:      General: He is not in acute distress.    Appearance: Normal appearance.     Comments: Not in acute distress  HENT:     Head: Normocephalic and atraumatic.  Eyes:     Conjunctiva/sclera: Conjunctivae normal.     Pupils: Pupils are equal, round, and reactive to light.  Cardiovascular:     Rate and Rhythm: Normal rate and regular rhythm.     Pulses: Normal pulses.  Pulmonary:     Effort: Pulmonary effort is normal. No respiratory distress.     Breath sounds: Normal breath sounds. No wheezing.     Comments: On oxygen, sats in the mid to low 90s.  Rales in the right lower lung.  No cough noted. Abdominal:     General: There is no distension.     Palpations: Abdomen is soft. There is no mass.     Tenderness: There is no abdominal tenderness. There is no guarding or rebound.  Genitourinary:    Comments: Dried black stool noted around the rectum. Musculoskeletal:        General: Normal range of motion.     Cervical back: Normal range of motion and neck supple.  Skin:    General: Skin is warm and dry.     Capillary Refill: Capillary refill takes less than 2 seconds.  Neurological:     Mental Status: He is alert.  Psychiatric:        Mood and Affect: Mood and affect normal.        Behavior: Behavior normal.    ED Results / Procedures  / Treatments   Labs (all labs ordered are listed, but only abnormal results are displayed) Labs Reviewed  LACTIC ACID, PLASMA - Abnormal; Notable for the following components:      Result Value   Lactic Acid, Venous 2.5 (*)    All other components within normal limits  LACTIC ACID, PLASMA - Abnormal; Notable for the following components:   Lactic Acid, Venous 2.4 (*)    All other components within normal limits  COMPREHENSIVE METABOLIC PANEL - Abnormal; Notable for the following components:  Sodium 132 (*)    Chloride 94 (*)    Glucose, Bld 108 (*)    BUN 28 (*)    Albumin 3.1 (*)    Alkaline Phosphatase 229 (*)    All other components within normal limits  CBC WITH DIFFERENTIAL/PLATELET - Abnormal; Notable for the following components:   WBC 12.7 (*)    Neutro Abs 10.7 (*)    Monocytes Absolute 1.2 (*)    Abs Immature Granulocytes 0.08 (*)    All other components within normal limits  CBG MONITORING, ED - Abnormal; Notable for the following components:   Glucose-Capillary 114 (*)    All other components within normal limits  RESP PANEL BY RT-PCR (FLU A&B, COVID) ARPGX2  CULTURE, BLOOD (ROUTINE X 2)  CULTURE, BLOOD (ROUTINE X 2)  URINE CULTURE  PROTIME-INR  APTT  URINALYSIS, ROUTINE W REFLEX MICROSCOPIC  MISCELLANEOUS GENETIC TEST    EKG EKG Interpretation  Date/Time:  Thursday August 30 2021 10:17:02 EDT Ventricular Rate:  106 PR Interval:  178 QRS Duration: 89 QT Interval:  320 QTC Calculation: 425 R Axis:   77 Text Interpretation: Sinus tachycardia Sinus tachycardia Confirmed by Lavenia Atlas [phone]) on 08/30/2021 11:12:37 AM  Radiology DG Pelvis 1-2 Views  Result Date: 08/30/2021 CLINICAL DATA:  Fall, pelvic pain EXAM: PELVIS - 1-2 VIEW COMPARISON:  03/11/2021 FINDINGS: There is no evidence of pelvic fracture or diastasis. Evaluation of the sacrum is somewhat limited by overlying stool and bowel gas. Innumerable sclerotic osseous metastatic lesions  throughout the pelvis, visualized lower lumbar spine, and bilateral femurs. IMPRESSION: 1. No evidence of acute pelvic fracture or diastasis. 2. Widespread sclerotic osseous metastatic disease. Electronically Signed   By: Davina Poke D.O.   On: 08/30/2021 12:06   CT HEAD WO CONTRAST (5MM)  Result Date: 08/30/2021 CLINICAL DATA:  Head trauma, fall EXAM: CT HEAD WITHOUT CONTRAST TECHNIQUE: Contiguous axial images were obtained from the base of the skull through the vertex without intravenous contrast. COMPARISON:  None. FINDINGS: Brain: No evidence of acute infarction, hemorrhage, hydrocephalus, extra-axial collection or mass lesion/mass effect. Mild low-density changes within the periventricular and subcortical white matter compatible with chronic microvascular ischemic change. Mild diffuse cerebral volume loss. Vascular: Atherosclerotic calcifications involving the large vessels of the skull base. No unexpected hyperdense vessel. Skull: Scattered sclerotic osseous metastatic lesions at the skull base. Negative for acute calvarial fracture. Sinuses/Orbits: Mild mucosal thickening involving the maxillary sinuses, ethmoid air cells, and left sphenoid sinus. Other: Negative for scalp hematoma. IMPRESSION: 1. No acute intracranial findings. 2. Mild chronic microvascular ischemic change and cerebral volume loss. 3. Scattered sclerotic osseous metastatic lesions at the skull base. Electronically Signed   By: Davina Poke D.O.   On: 08/30/2021 12:09   DG Chest Port 1 View  Result Date: 08/30/2021 CLINICAL DATA:  Questionable sepsis EXAM: PORTABLE CHEST 1 VIEW COMPARISON:  None. FINDINGS: The cardiomediastinal silhouette is normal. There is patchy opacity in the right lower lobe. There is no other focal consolidation. There is no pulmonary edema. There is no significant pleural effusion. There is no pneumothorax. There is no acute osseous abnormality. There is degenerative change of both shoulders. Multiple  remote left-sided rib fractures are seen. IMPRESSION: Right lower lobe pneumonia. Recommend follow-up radiographs in 6-8 weeks to assess for resolution. Electronically Signed   By: Valetta Mole M.D.   On: 08/30/2021 11:10    Procedures .Critical Care Performed by: Franchot Heidelberg, PA-C Authorized by: Franchot Heidelberg, PA-C   Critical care provider  statement:    Critical care time (minutes):  40   Critical care time was exclusive of:  Separately billable procedures and treating other patients and teaching time   Critical care was necessary to treat or prevent imminent or life-threatening deterioration of the following conditions:  Sepsis   Critical care was time spent personally by me on the following activities:  Development of treatment plan with patient or surrogate, blood draw for specimens, discussions with consultants, evaluation of patient's response to treatment, examination of patient, obtaining history from patient or surrogate, ordering and performing treatments and interventions, ordering and review of laboratory studies, ordering and review of radiographic studies and pulse oximetry   I assumed direction of critical care for this patient from another provider in my specialty: no     Care discussed with: admitting provider     Medications Ordered in ED Medications  lactated ringers infusion ( Intravenous New Bag/Given 08/30/21 1049)  vancomycin (VANCOREADY) IVPB 1250 mg/250 mL (1,250 mg Intravenous New Bag/Given 08/30/21 1204)  vancomycin (VANCOREADY) IVPB 1250 mg/250 mL (has no administration in time range)  ceFEPIme (MAXIPIME) 2 g in sodium chloride 0.9 % 100 mL IVPB (has no administration in time range)  ceFEPIme (MAXIPIME) 2 g in sodium chloride 0.9 % 100 mL IVPB (0 g Intravenous Stopped 08/30/21 1317)  metroNIDAZOLE (FLAGYL) IVPB 500 mg (0 mg Intravenous Stopped 08/30/21 1323)  acetaminophen (TYLENOL) tablet 650 mg (650 mg Oral Given by Other 08/30/21 1102)  lactated ringers  bolus 1,000 mL (0 mLs Intravenous Stopped 08/30/21 1323)    ED Course  I have reviewed the triage vital signs and the nursing notes.  Pertinent labs & imaging results that were available during my care of the patient were reviewed by me and considered in my medical decision making (see chart for details).    MDM Rules/Calculators/A&P                           Patient presenting with reports of fever, weakness, and a fall yesterday.  On my exam, patient does not appear in acute distress.  However he is tachycardic and febrile.  Concern for sepsis.  He is unable to provide any history and son is providing limited history, unable to get in touch with patient's daughter for further history.  Will obtain sepsis work-up and imaging to ensure no injury from the fall.  Labs interpreted by me, shows mild leukocytosis.  Lactic Minimally elevated at 2.5.  Broad-spectrum antibiotics have been ordered.  Chest x-ray viewed and independently interpreted by me, shows a right lower lobe pneumonia.  CT head and pelvis x-rays negative for acute findings.  After fluids, Tylenol, and antibiotics, sepsis reassessment performed and heart rate and temperature are improved.  Patient will need to be admitted for PNA with new O2 demand and sepsis.   Discussed with internal medicine teaching service, patient to be admitted.  Final Clinical Impression(s) / ED Diagnoses Final diagnoses:  Sepsis with acute organ dysfunction without septic shock, due to unspecified organism, unspecified type [facility])  Pneumonia of right lower lobe due to infectious organism  Fall, initial encounter    Rx / DC Orders ED Discharge Orders     None        Franchot Heidelberg, PA-C 08/30/21 1323    Lorelle Gibbs, DO 08/30/21 1518

## 2021-08-30 NOTE — ED Notes (Signed)
Pt resting comfortably at this time.

## 2021-08-30 NOTE — Progress Notes (Signed)
Pharmacy Antibiotic Note  Donald Ellis is a 87 y.o. male admitted on 08/30/2021 with sepsis.  Pharmacy has been consulted for vancomycin and cefepime dosing.  Plan: Vancomycin 1250mg  IV q24h (eAUC 491, Cr 0.96mg /dL)  -Cefepime 2g IV q8h -Monitor renal function, clinical status, and antibiotic plan -Order vanc levels when necessary  Height: 5\' 10"  (177.8 cm) Weight: 69.9 kg (154 lb 1.6 oz) IBW/kg (Calculated) : 73  Temp (24hrs), Avg:100.3 F (37.9 C), Min:98 F (36.7 C), Max:102.5 F (39.2 C)  Recent Labs  Lab 08/30/21 1020  WBC 12.7*  CREATININE 0.96  LATICACIDVEN 2.4*  2.5*    Estimated Creatinine Clearance: 55.6 mL/min (by C-G formula based on SCr of 0.96 mg/dL).    Allergies  Allergen Reactions   Hydrocodone-Acetaminophen Itching    Itching and tingling without a rash   Antimicrobials this admission: Vanc 9/22 >>  Cefepime 9/22 >>  Flagyl x1  Dose adjustments this admission: N/A  Microbiology results: 9/22 BCx:  9/22 UCx:    Thank you for allowing pharmacy to be a part of this patient's care.  Joetta Manners, PharmD, [facility] Emergency Medicine Clinical Pharmacist ED RPh Phone: [place]: [phone]

## 2021-08-30 NOTE — H&P (Addendum)
Date: 08/30/2021               Patient Name:  Donald Ellis MRN: [id number]  DOB: [birth year] Age / Sex: 87 y.o., male   PCP: Donald Sacramento, MD         Medical Service: Internal Medicine Teaching Service         Attending Physician: Dr. Sid Falcon, MD    First Contact: Donald Fuchs, DO Pager: FM [phone]  Second Contact: Donald Pais, MD Pager: Rudean Curt [phone]       After Hours (After 5p/  First Contact Pager: [phone]  weekends / holidays): Second Contact Pager: [phone]   SUBJECTIVE   Chief Complaint: weakness and chest tightness  History of Present Illness: Donald Ellis is a 87 year old living with COPD, bladder outlet obstruction status post TURP, and prostate cancer with metastasis to bone who present with weakness and chest tightness. Patient is hard of hearing. Most of the history obtained from patient's son is in the room and says his father has not felt well this week.  Patient lives with his wife and daughter.  Patient has had some chest tightness and says he feels tired.  Patient did have a fall yesterday due to weakness and son was able to help him up. Given progression of illness over the week family decided to bring patient to ER today.   Review of Systems  Constitutional:  Positive for fever and malaise/fatigue. Negative for chills.  HENT:  Negative for congestion and sinus pain.   Eyes:  Negative for discharge and redness.  Respiratory:  Positive for cough and sputum production.   Cardiovascular:  Negative for chest pain and leg swelling.       Chest tightness  Gastrointestinal:  Negative for abdominal pain and diarrhea.  Genitourinary:  Negative for dysuria and hematuria.  Musculoskeletal:  Positive for falls. Negative for joint pain (chronic knee pain).  Neurological:  Negative for dizziness and headaches.  Psychiatric/Behavioral:  Negative for depression. The patient is not nervous/anxious.      Meds:  Current Meds  Medication Sig   acetaminophen  (TYLENOL) 650 MG CR tablet Take 650 mg by mouth every 8 (eight) hours as needed for pain.   albuterol (VENTOLIN HFA) 108 (90 Base) MCG/ACT inhaler Inhale 2 puffs into the lungs every 4 (four) hours as needed for wheezing or shortness of breath.   Cholecalciferol (VITAMIN D3) 125 MCG (5000 UT) TABS Take 5,000 Units by mouth daily.   diphenhydramine-acetaminophen (TYLENOL PM) 25-500 MG TABS tablet Take 1 tablet by mouth at bedtime.   pantoprazole (PROTONIX) 40 MG tablet TAKE 1 TABLET BY MOUTH TWICE A DAY   SYMBICORT 160-4.5 MCG/ACT inhaler Inhale 1 puff into the lungs 2 (two) times daily.   tamsulosin (FLOMAX) 0.4 MG CAPS capsule Take 0.4 mg by mouth 2 (two) times daily.   traMADol (ULTRAM) 50 MG tablet Take 50 mg by mouth every 6 (six) hours as needed.    Past Medical History:  Diagnosis Date   Foley catheter in place    LAST CHANGED 07-04-2001   HOH (hard of hearing)    Urinary retention     Past Surgical History:  Procedure Laterality Date   BIOPSY  03/12/2021   Procedure: BIOPSY;  Surgeon: Doran Stabler, MD;  Location: [facility];  Service: Gastroenterology;;   CYSTOSCOPY WITH INSERTION OF [place] N/A 07/01/2019   Procedure: CYSTOSCOPY WITH INSERTION OF UROLIFT;  Surgeon: Cleon Gustin, MD;  Location: [facility];  Service: Urology;  Laterality: N/A;   ESOPHAGOGASTRODUODENOSCOPY (EGD) WITH PROPOFOL N/A 03/12/2021   Procedure: ESOPHAGOGASTRODUODENOSCOPY (EGD) WITH PROPOFOL;  Surgeon: Doran Stabler, MD;  Location: [place];  Service: Gastroenterology;  Laterality: N/A;   HOT HEMOSTASIS N/A 03/12/2021   Procedure: HOT HEMOSTASIS (ARGON PLASMA COAGULATION/BICAP);  Surgeon: Doran Stabler, MD;  Location: [facility];  Service: Gastroenterology;  Laterality: N/A;   SCLEROTHERAPY  03/12/2021   Procedure: SCLEROTHERAPY;  Surgeon: Doran Stabler, MD;  Location: [place];  Service: Gastroenterology;;   TRANSURETHRAL RESECTION OF PROSTATE N/A 08/30/2019    Procedure: TRANSURETHRAL RESECTION OF THE PROSTATE (TURP);  Surgeon: Cleon Gustin, MD;  [facility];  Service: Urology;  Laterality: N/A;  2 HRS    Social:  Lives With: Wife and daughter. 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 No alcohol use or drug use.   Family History:  Family History  Problem Relation Age of Onset   GI Bleed Neg Hx      Allergies: Allergies as of 08/30/2021 - Review Complete 08/30/2021  Allergen Reaction Noted   Hydrocodone-acetaminophen Itching 09/10/2019    OBJECTIVE:   Physical Exam: Blood pressure (!) 116/59, pulse 87, temperature 99.7 F (37.6 C), temperature source Oral, resp. rate (!) 29, height 5' 10" (1.778 m), weight 69.9 kg, SpO2 94 %.  General: Ill appearing elderly man, nl weight,  hearing impaired ( does not having hearing aids with him) HE: Normocephalic, atraumatic , EOMI, Conjunctivae normal ENT: No congestion, no rhinorrhea, no exudate or erythema  Cardiovascular: Normal rate, regular rhythm.  No murmurs, rubs, or gallops Pulmonary : Effort normal, rales in right lower lung field. Abdominal: soft, nontender,  bowel sounds present Musculoskeletal: no swelling , deformity, injury ,or tenderness in extremities Skin: Warm, dry , no bruising, erythema, or rash Psychiatric/Behavioral:  normal mood, normal behavior  Neuro: Alert , following commands,     Labs: CBC    Component Value Date/Time   WBC 12.7 (H) 08/30/2021 1020   RBC 4.38 08/30/2021 1020   HGB 14.4 08/30/2021 1020   HCT 42.7 08/30/2021 1020   PLT 152 08/30/2021 1020   MCV 97.5 08/30/2021 1020   MCH 32.9 08/30/2021 1020   MCHC 33.7 08/30/2021 1020   RDW 15.5 08/30/2021 1020   LYMPHSABS 0.7 08/30/2021 1020   MONOABS 1.2 (H) 08/30/2021 1020   EOSABS 0.0 08/30/2021 1020   BASOSABS 0.0 08/30/2021 1020     CMP      Component Value Date/Time   NA 132 (L) 08/30/2021 1020   K 4.0 08/30/2021 1020   CL 94 (L) 08/30/2021 1020   CO2 25 08/30/2021 1020   GLUCOSE 108 (H) 08/30/2021 1020   BUN 28 (H) 08/30/2021 1020   CREATININE 0.96 08/30/2021 1020   CALCIUM 8.9 08/30/2021 1020   PROT 7.0 08/30/2021 1020   ALBUMIN 3.1 (L) 08/30/2021 1020   AST 33 08/30/2021 1020   ALT 17 08/30/2021 1020   ALKPHOS 229 (H) 08/30/2021 1020   BILITOT 1.1 08/30/2021 1020   GFRNONAA >60 08/30/2021 1020   GFRAA >60 08/31/2019 0718    Imaging: DG Pelvis 1-2 Views  Result Date: 08/30/2021 CLINICAL DATA:  Fall, pelvic pain EXAM: PELVIS - 1-2 VIEW COMPARISON:  03/11/2021 FINDINGS: There is no evidence of pelvic fracture or diastasis. Evaluation of the sacrum is somewhat limited by overlying stool  and bowel gas. Innumerable sclerotic osseous metastatic lesions throughout the pelvis, visualized lower lumbar spine, and bilateral femurs. IMPRESSION: 1. No evidence of acute pelvic fracture or diastasis. 2. Widespread sclerotic osseous metastatic disease. Electronically Signed   By: Davina Poke D.O.   On: 08/30/2021 12:06   CT HEAD WO CONTRAST (5MM)  Result Date: 08/30/2021 CLINICAL DATA:  Head trauma, fall EXAM: CT HEAD WITHOUT CONTRAST TECHNIQUE: Contiguous axial images were obtained from the base of the skull through the vertex without intravenous contrast. COMPARISON:  None. FINDINGS: Brain: No evidence of acute infarction, hemorrhage, hydrocephalus, extra-axial collection or mass lesion/mass effect. Mild low-density changes within the periventricular and subcortical white matter compatible with chronic microvascular ischemic change. Mild diffuse cerebral volume loss. Vascular: Atherosclerotic calcifications involving the large vessels of the skull base. No unexpected hyperdense vessel. Skull: Scattered sclerotic osseous metastatic lesions at the skull base. Negative for acute calvarial fracture. Sinuses/Orbits: Mild mucosal  thickening involving the maxillary sinuses, ethmoid air cells, and left sphenoid sinus. Other: Negative for scalp hematoma. IMPRESSION: 1. No acute intracranial findings. 2. Mild chronic microvascular ischemic change and cerebral volume loss. 3. Scattered sclerotic osseous metastatic lesions at the skull base. Electronically Signed   By: Davina Poke D.O.   On: 08/30/2021 12:09   DG Chest Port 1 View  Result Date: 08/30/2021 CLINICAL DATA:  Questionable sepsis EXAM: PORTABLE CHEST 1 VIEW COMPARISON:  None. FINDINGS: The cardiomediastinal silhouette is normal. There is patchy opacity in the right lower lobe. There is no other focal consolidation. There is no pulmonary edema. There is no significant pleural effusion. There is no pneumothorax. There is no acute osseous abnormality. There is degenerative change of both shoulders. Multiple remote left-sided rib fractures are seen. IMPRESSION: Right lower lobe pneumonia. Recommend follow-up radiographs in 6-8 weeks to assess for resolution. Electronically Signed   By: Valetta Mole M.D.   On: 08/30/2021 11:10    EKG: personally reviewed my interpretation is sinus tachycardia   ASSESSMENT & PLAN:    Assessment & Plan by Problem: Active Problems:   Pneumonia   COVID-19   Sepsis ([place])  Donald Ellis is a 87 year old living with COPD, bladder outlet obstruction status post TURP, and prostate cancer with metastasis to bone who presents with weakness and chest tightness.   #Sepsis in setting of right lower lobe pneumonia and COVID-19 Patient admitted through ED as code sepsis with fever, tachycardia, tachypnea, and mild lactic acidosis.  Lactic acidosis is continued to trend up after 1L LR bolus and on maintenance fluids. I have ordered additional fluid bolus and will trend lactic acid. He was started on broad-spectrum antibiotics.  Continue Vanc and Cefepime. Chest x-ray shows possible source as right lower lobe infiltrate.  Will obtain MRSA PCR of  nares , and can discontinue MRSA coverage if negative. Obtaining strep and legionella urine antigens. Blood cultures obtained. UA does not suggest urinary source.  -Patient on 4L supplemental oxygen with positive Covid Test and history of COPD, will start patient on Decadron - Continue home inhalers - Wean oxygen as tolerated  - Trend inflammatory lab  #Prostate cancer with metastasis to bone Son was not aware of the details but knows patient is undergoing treatment. Confirmed in PCP notes, but unable to see urologist notes. He has had TURP.  Alk phos is elevated and imaging here shows incidental findings of osseous metastatic lesions in the skull and in the spine. Will order GGT, but likely Alk phos elevated from metastatic lesions in the  bone.   #Hyponatremia - mild, asymptomatic, trend with fluids.  Diet: Normal VTE: Enoxaparin IVF: LR,Bolus, followed by 150 cc/hr Code: Full  Prior to Admission Living Arrangement: Home, living wife and daughter, independent.  Anticipated Discharge Location:  TBD Barriers to Discharge: Treatment and workup as above  Dispo: Admit patient to Inpatient with expected length of stay greater than 2 midnights.  Signed: Drema Pry, MD Internal Medicine Resident PGY-3 Pager: [phone]  08/30/2021, 6:13 PM

## 2021-08-30 NOTE — ED Notes (Signed)
Pt resting comfortably at this time. Pt has not urinated at this time. Urinal at bedside.

## 2021-08-30 NOTE — Progress Notes (Signed)
PHARMACY NOTE:  ANTIMICROBIAL RENAL DOSAGE ADJUSTMENT  Current antimicrobial regimen includes a mismatch between antimicrobial dosage and estimated renal function.  As per policy approved by the Pharmacy & Therapeutics and Medical Executive Committees, the antimicrobial dosage will be adjusted accordingly.  Current antimicrobial dosage:  Cefepime 2 gm IV Q 8 hrs  Indication:  Sepsis  Renal Function:  Estimated Creatinine Clearance: 55.6 mL/min (by C-G formula based on SCr of 0.96 mg/dL). []      On intermittent HD, scheduled: []      On CRRT    Antimicrobial dosage has been changed to:  Cefepime 2 gm IV Q 12 hrs  Thank you for allowing pharmacy to be a part of this patient's care.  Gillermina Hu, PharmD, BCPS, [facility] Clinical Pharmacist 08/30/2021 8:21 PM

## 2021-08-31 ENCOUNTER — Other Ambulatory Visit: Payer: Self-pay | Admitting: Gastroenterology

## 2021-08-31 DIAGNOSIS — U071 COVID-19: Secondary | ICD-10-CM

## 2021-08-31 DIAGNOSIS — A419 Sepsis, unspecified organism: Principal | ICD-10-CM

## 2021-08-31 DIAGNOSIS — J1282 Pneumonia due to coronavirus disease 2019: Secondary | ICD-10-CM

## 2021-08-31 LAB — CBC WITH DIFFERENTIAL/PLATELET
Abs Immature Granulocytes: 0.08 10*3/uL — ABNORMAL HIGH (ref 0.00–0.07)
Basophils Absolute: 0 10*3/uL (ref 0.0–0.1)
Basophils Relative: 0 %
Eosinophils Absolute: 0 10*3/uL (ref 0.0–0.5)
Eosinophils Relative: 0 %
HCT: 36 % — ABNORMAL LOW (ref 39.0–52.0)
Hemoglobin: 12.2 g/dL — ABNORMAL LOW (ref 13.0–17.0)
Immature Granulocytes: 1 %
Lymphocytes Relative: 6 %
Lymphs Abs: 0.9 10*3/uL (ref 0.7–4.0)
MCH: 32.7 pg (ref 26.0–34.0)
MCHC: 33.9 g/dL (ref 30.0–36.0)
MCV: 96.5 fL (ref 80.0–100.0)
Monocytes Absolute: 0.9 10*3/uL (ref 0.1–1.0)
Monocytes Relative: 6 %
Neutro Abs: 13.4 10*3/uL — ABNORMAL HIGH (ref 1.7–7.7)
Neutrophils Relative %: 87 %
Platelets: 143 10*3/uL — ABNORMAL LOW (ref 150–400)
RBC: 3.73 MIL/uL — ABNORMAL LOW (ref 4.22–5.81)
RDW: 15.3 % (ref 11.5–15.5)
WBC: 15.2 10*3/uL — ABNORMAL HIGH (ref 4.0–10.5)
nRBC: 0 % (ref 0.0–0.2)

## 2021-08-31 LAB — COMPREHENSIVE METABOLIC PANEL
ALT: 16 U/L (ref 0–44)
AST: 29 U/L (ref 15–41)
Albumin: 2.4 g/dL — ABNORMAL LOW (ref 3.5–5.0)
Alkaline Phosphatase: 152 U/L — ABNORMAL HIGH (ref 38–126)
Anion gap: 7 (ref 5–15)
BUN: 20 mg/dL (ref 8–23)
CO2: 27 mmol/L (ref 22–32)
Calcium: 8.9 mg/dL (ref 8.9–10.3)
Chloride: 99 mmol/L (ref 98–111)
Creatinine, Ser: 0.74 mg/dL (ref 0.61–1.24)
GFR, Estimated: >60 mL/min (ref >60)
Glucose, Bld: 117 mg/dL — ABNORMAL HIGH (ref 70–99)
Potassium: 3.9 mmol/L (ref 3.5–5.1)
Sodium: 133 mmol/L — ABNORMAL LOW (ref 135–145)
Total Bilirubin: 0.9 mg/dL (ref 0.3–1.2)
Total Protein: 5.6 g/dL — ABNORMAL LOW (ref 6.5–8.1)

## 2021-08-31 LAB — GAMMA GT: GGT: 28 U/L (ref 7–50)

## 2021-08-31 LAB — PHOSPHORUS: Phosphorus: 2.5 mg/dL (ref 2.5–4.6)

## 2021-08-31 LAB — C-REACTIVE PROTEIN: CRP: 20.3 mg/dL — ABNORMAL HIGH (ref <1.0)

## 2021-08-31 LAB — FERRITIN: Ferritin: 166 ng/mL (ref 24–336)

## 2021-08-31 LAB — MAGNESIUM: Magnesium: 1.7 mg/dL (ref 1.7–2.4)

## 2021-08-31 LAB — D-DIMER, QUANTITATIVE: D-Dimer, Quant: 3.01 ug/mL-FEU — ABNORMAL HIGH (ref 0.00–0.50)

## 2021-08-31 MED ORDER — ADULT MULTIVITAMIN W/MINERALS CH
1.0000 | ORAL_TABLET | Freq: Every day | ORAL | Status: DC
  Administered 2021-08-31 – 2021-09-02 (×3): 1 via ORAL
  Filled 2021-08-31 (×2): qty 1

## 2021-08-31 MED ORDER — ENSURE ENLIVE PO LIQD
237.0000 mL | Freq: Three times a day (TID) | ORAL | Status: DC
  Administered 2021-08-31 – 2021-09-02 (×6): 237 mL via ORAL

## 2021-08-31 MED ORDER — SODIUM CHLORIDE 0.9 % IV SOLN
2.0000 g | Freq: Three times a day (TID) | INTRAVENOUS | Status: DC
  Administered 2021-08-31 – 2021-09-02 (×6): 2 g via INTRAVENOUS
  Filled 2021-08-31 (×6): qty 2

## 2021-08-31 NOTE — Progress Notes (Signed)
Date: 08/31/2021  Patient name: Donald Ellis  Medical record number: [id number]  Date of birth: 01/09/1934   I have seen and evaluated Donald Ellis and discussed their care with the Residency Team.  In brief, patient is an 87 year old male with a past medical history of COPD, bladder outlet obstruction status post TURP and prostate cancer with mets to the bone who presents to the ED with worsening weakness and chest tightness over the last week.  History obtained from chart and patient.  Patient lives with his wife and daughter at home and notes worsening weakness over the last week.  He had a fall at home secondary to his weakness and was unable to get up without the help of his son.  Patient also complained of intermittent chest tightness over the last week and worsening fatigue.  Patient did complain of worsening cough with increased sputum production as well.  Patient also had fevers and had a T-max of 102.5 F while here.  No palpitations, no lightheadedness, no syncope, no focal weakness, tingling or numbness, no headache, no blurry vision, no nausea or vomiting, no diarrhea, no abdominal pain.  Today, patient states that he has been bringing up a lot of phlegm overnight and states that his shortness of breath and cough appear to be improving.  PMHx, Fam Hx, and/or Soc Hx : As per resident admit note  Vitals:   08/31/21 0201 08/31/21 0435  BP: 123/69 118/67  Pulse: 80 77  Resp: (!) 21 20  Temp: (!) 96.4 F (35.8 C) 97.6 F (36.4 C)  SpO2: 94% 94%   General: Awake, alert, oriented x3, NAD, CVS: Regular rate and rhythm, normal heart sounds Lungs: Right lower lobe crackles noted on exam Abdomen: Soft, nontender, nondistended, active bowel sounds Extremities: No edema noted, nontender to palpation Psych: Normal mood and affect HEENT: Normocephalic, atraumatic, hard of hearing Skin: Warm and dry  Assessment and Plan: I have seen and evaluated the patient as outlined above. I agree  with the formulated Assessment and Plan as detailed in the residents' note, with the following changes:   1.  Sepsis secondary to right lower lobe pneumonia and COVID-19 infection: -Patient presented to the ED with worsening weakness and fatigue over the last week with increased cough and sputum production as well as mechanical fall at home and was found to have a right lower lobe pneumonia as well as COVID-19 infection on admission. -Patient was noted to have a lactic acidosis up to 5.2 as well as fever up to 102.5 F and was noted to be tachycardic in the 100s as well as tachypneic in the 20s to 30s on admission as well as hypoxic requiring 4 L nasal cannula consistent with sepsis secondary to his underlying pneumonia.  X-ray pattern was not consistent with of viral pneumonia and suggests a likely superimposed bacterial infection causing his pneumonia. -We will monitor CBC daily.  Patient white count elevated to 15 today (possibly secondary to steroids) -Patient was empirically started on vancomycin and cefepime.  MRSA swab came back negative.  Will DC vancomycin continue cefepime for now -We will continue with O2 supplementation as needed -We will follow-up blood cultures, strep and Legionella antigens -We will start the patient on Decadron given his hypoxia and positive COVID.  We will continue to trend inflammatory markers which appear to be uptrending today. -If patient's hypoxia worsens would consider starting the patient on baricitinib -No further work-up for now.  We will continue to monitor closely.  Dareen Piano,  Shirin Echeverry, MD 9/23/20229:12 AM

## 2021-08-31 NOTE — Progress Notes (Addendum)
HD#1 SUBJECTIVE:  Patient Summary: Donald Ellis is a 87 y.o. male with a pertinent PMH of COPD, bladder outlet obstruction status post TURP and prostate cancer with metastasis to bone, who presented with weakness and chest tightness for about 1 week, then admitted for sepsis 2/2 pneumonia and concurrent COVID infection.  Overnight Events: Tachypneic overnight  Patient was seen this AM sitting in bed comfortably and was A&Ox4. Noted thick, green sputum and container next to bedside.  Patient reported feeling much better today, and improving shortness of breath.  However he still has significant productive cough.  Otherwise no other concerns. He denies chest pain, abdominal pain, worsening cough, and worsening shortness of breath.  OBJECTIVE:  Vital Signs: Vitals:   08/31/21 0030 08/31/21 0100 08/31/21 0201 08/31/21 0435  BP: (!) 104/59 115/63 123/69 118/67  Pulse: 76 80 80 77  Resp: (!) 26 (!) 28 (!) 21 20  Temp:  97.6 F (36.4 C) (!) 96.4 F (35.8 C) 97.6 F (36.4 C)  TempSrc:  Oral Oral Oral  SpO2: 94% 94% 94% 94%  Weight:      Height:       Supplemental O2: Nasal Cannula SpO2: 94 % O2 Flow Rate (L/min): 4 L/min  Filed Weights   08/30/21 1025  Weight: 69.9 kg    Intake/Output Summary (Last 24 hours) at 08/31/2021 1634 Last data filed at 08/31/2021 8891 Gross per 24 hour  Intake 2863.78 ml  Output 800 ml  Net 2063.78 ml    Net IO Since Admission: 1,863.78 mL [08/31/21 1634]  Physical Exam Vitals and nursing note reviewed.  Constitutional:      General: He is not in acute distress.    Appearance: He is not toxic-appearing or diaphoretic.  HENT:     Head: Normocephalic and atraumatic.  Cardiovascular:     Rate and Rhythm: Normal rate and regular rhythm.  Pulmonary:     Effort: Pulmonary effort is normal. Prolonged expiration present. No respiratory distress (On 4L O2 [place]).     Breath sounds: Decreased air movement present. No wheezing.     Comments: Coarse breath  sounds in RLL Musculoskeletal:     Cervical back: Normal range of motion.  Skin:    General: Skin is warm and dry.  Neurological:     Mental Status: He is alert and oriented to person, place, and time.   Pertinent Labs: CBC Latest Ref Rng & Units 08/31/2021 08/30/2021 04/25/2021  WBC 4.0 - 10.5 K/uL 15.2(H) 12.7(H) 6.9  Hemoglobin 13.0 - 17.0 g/dL 12.2(L) 14.4 10.4(L)  Hematocrit 39.0 - 52.0 % 36.0(L) 42.7 31.2(L)  Platelets 150 - 400 K/uL 143(L) 152 235.0    CMP Latest Ref Rng & Units 08/31/2021 08/30/2021 03/15/2021  Glucose 70 - 99 mg/dL 117(H) 108(H) 103(H)  BUN 8 - 23 mg/dL 20 28(H) 15  Creatinine 0.61 - 1.24 mg/dL 0.74 0.96 0.70  Sodium 135 - 145 mmol/L 133(L) 132(L) 140  Potassium 3.5 - 5.1 mmol/L 3.9 4.0 3.3(L)  Chloride 98 - 111 mmol/L 99 94(L) 105  CO2 22 - 32 mmol/L _0 Calcium 8.9 - 10.3 mg/dL 8.9 8.9 8.5(L)  Total Protein 6.5 - 8.1 g/dL 5.6(L) 7.0 -  Total Bilirubin 0.3 - 1.2 mg/dL 0.9 1.1 -  Alkaline Phos 38 - 126 U/L 152(H) 229(H) -  AST 15 - 41 U/L 29 33 -  ALT 0 - 44 U/L 16 17 -    Recent Labs    08/30/21 1019  GLUCAP 114*  Pertinent Imaging: No results found.  ASSESSMENT/PLAN:  Assessment: Active Problems:   Pneumonia   COVID-19   Sepsis ([place])  Donald Ellis is a 87 y.o. male with a pertinent PMH of COPD, bladder outlet obstruction status post TURP and prostate cancer with metastasis to bone, who presented with weakness and chest tightness for about 1 week, then admitted for sepsis 2/2 pneumonia and concurrent COVID infection. HD#1  Plan: #Sepsis 2/2 RLL with concurrent COVID-19 (Day 1) COVID positive on 08/30/2021. VSS, afebrile (received Tylenol), SPO2 94% on 4 L nasal cannula, WBC 15.2.  Inflammatory labs increased from yesterday, will trend. We will continue to follow.  Patient mental status has improved, alert and oriented x4, seems to be feeling better.  ROS above. - Pending blood culture and sensitivity - Pending urine culture -  Penging Legionella and strep Ag - Continue cefepime IV 2 g - Continue Decadron 6 mg p.o. - Continue home Dulera inhaler - Continue O2 supplement as needed - Trend CBC - Trend inflammatory lab (lactic acid, CRP, D-dimer, ferritin) - MRSA by PCR -none detected, DC vancomycin   #Prostate cancer with metastasis to bone Son was not aware of the details but knows patient is undergoing treatment. Confirmed in PCP notes, but unable to see urologist notes. He has had TURP.  Alk phos is elevated and imaging here shows incidental findings of osseous metastatic lesions in the skull and in the spine. GGT 28 suggests that Alk phos elevated from metastatic lesions in the bone rather than hepatic.  #Hyponatremia Na+ 133, mild, asymptomatic, trend with fluids. - Trend CMP  Best Practice: Diet: Normal IVF: None,None VTE:  Lovenox 40 mg Code: Full Code  PT/OT recs: Pending, none. TOC recs: Pending  Prior to Admission Living Arrangement: Home Anticipated Discharge Location: TBD  Signature: Merrily Brittle, D.O. Psychiatry Resident, PGY-1 [facility] Internal Medicine Teaching Service Pager: [phone] 4:34 PM, 08/31/2021   Please contact the on call pager after 5 pm and on weekends at [phone].

## 2021-08-31 NOTE — Plan of Care (Signed)

## 2021-08-31 NOTE — Plan of Care (Signed)

## 2021-08-31 NOTE — Hospital Course (Signed)
Donald Ellis is a 87 y.o. male with a pertinent PMH of COPD, bladder outlet obstruction status post TURP and prostate cancer with metastasis to bone, who presented with weakness and chest tightness for about 1 week, then admitted for sepsis 2/2 pneumonia and concurrent COVID infection. HD#1  Plan: #Sepsis 2/2 right lower lobe pneumonia with concurrent COVID-19 Patient admitted through ED as code sepsis with fever, tachycardia, tachypnea, and mild lactic acidosis.  Lactic acidosis is continued to trend up after 1L LR bolus and on maintenance fluids. I have ordered additional fluid bolus and will trend lactic acid. He was started on broad-spectrum antibiotics.  Continue Vanc and Cefepime. Chest x-ray shows possible source as right lower lobe infiltrate.  Will obtain MRSA PCR of nares , and can discontinue MRSA coverage if negative. Obtaining strep and legionella urine antigens. Blood cultures obtained. UA does not suggest urinary source.  -Patient on 4L supplemental oxygen with positive Covid Test and history of COPD, will start patient on Decadron - Continue home inhalers - Wean oxygen as tolerated  - Trend inflammatory lab   #Prostate cancer with metastasis to bone Son was not aware of the details but knows patient is undergoing treatment. Confirmed in PCP notes, but unable to see urologist notes. He has had TURP.  Alk phos is elevated and imaging here shows incidental findings of osseous metastatic lesions in the skull and in the spine. Will order GGT, but likely Alk phos elevated from metastatic lesions in the bone.    #Hyponatremia - mild, asymptomatic, trend with fluids.

## 2021-08-31 NOTE — Progress Notes (Addendum)
Initial Nutrition Assessment  DOCUMENTATION CODES:   Not applicable  INTERVENTION:   Ensure Enlive po TID, each supplement provides 350 kcal and 20 grams of protein Magic cup TID with meals, each supplement provides 290 kcal and 9 grams of protein MVI with minerals daily  NUTRITION DIAGNOSIS:   Increased nutrient needs related to catabolic illness, chronic illness (COVID-19, COPD) as evidenced by estimated needs.  GOAL:   Patient will meet greater than or equal to 90% of their needs  MONITOR:   PO intake, Supplement acceptance, Labs  REASON FOR ASSESSMENT:   Consult Assessment of nutrition requirement/status  ASSESSMENT:   87 yo male admitted with sepsis, RLL PNA, COVID-19. PMH includes COPD, prostate cancer with bone mets, HOH.  Patient receiving care at bedside, unable to complete nutrition focused physical exam or obtain history at this time.  Currently on a regular diet. Meal intakes not recorded.  Given acute illness for the past week with weakness, fever, cough, SOB, suspect intake has been poor. Current weight 69.9 kg appears to have been carried over from previous encounter on 04/25/21. Need to obtain actual weight for accurate nutrition assessment.   Labs reviewed. Na 133  Medications reviewed and include Decadron, Protonix, Flomax.  NUTRITION - FOCUSED PHYSICAL EXAM:  Unable to complete  Diet Order:   Diet Order             Diet regular Room service appropriate? Yes; Fluid consistency: Thin  Diet effective now                   EDUCATION NEEDS:   Not appropriate for education at this time  Skin:  Skin Assessment: Reviewed RN Assessment  Last BM:  9/22  Height:   Ht Readings from Last 1 Encounters:  08/30/21 5\' 10"  (1.778 m)    Weight:   Wt Readings from Last 1 Encounters:  08/30/21 69.9 kg    BMI:  Body mass index is 22.11 kg/m.  Estimated Nutritional Needs:   Kcal:  2000-2200  Protein:  115-130 gm  Fluid:  >/= 2  L    Lucas Mallow, RD, LDN, CNSC Please refer to Amion for contact information.

## 2021-08-31 NOTE — Discharge Summary (Addendum)
Name: Donald Ellis MRN: [id number] DOB: December 15, 1933 87 y.o. PCP: Christain Sacramento, MD  Date of Admission: 08/30/2021 10:11 AM Date of Discharge: 09/02/2021 Attending Physician: Dr. Philipp Ovens   Discharge Diagnosis: RLL pneumonia COVID-19 COPD Bladder outlet obstruction status post TURP Prostate cancer with mets to bone   Discharge Medications: Allergies as of 09/02/2021       Reactions   Hydrocodone-acetaminophen Itching   Itching and tingling without a rash        Medication List     TAKE these medications    acetaminophen 650 MG CR tablet Commonly known as: TYLENOL Take 650 mg by mouth every 8 (eight) hours as needed for pain.   albuterol 108 (90 Base) MCG/ACT inhaler Commonly known as: VENTOLIN HFA Inhale 2 puffs into the lungs every 4 (four) hours as needed for wheezing or shortness of breath.   cefdinir 300 MG capsule Commonly known as: OMNICEF Take 1 capsule (300 mg total) by mouth every 12 (twelve) hours for 2 days.   dexamethasone 6 MG tablet Commonly known as: DECADRON Take 1 tablet (6 mg total) by mouth daily for 6 days.   diphenhydramine-acetaminophen 25-500 MG Tabs tablet Commonly known as: TYLENOL PM Take 1 tablet by mouth at bedtime.   pantoprazole 40 MG tablet Commonly known as: PROTONIX TAKE 1 TABLET BY MOUTH TWICE A DAY   Symbicort 160-4.5 MCG/ACT inhaler Generic drug: budesonide-formoterol Inhale 1 puff into the lungs 2 (two) times daily.   tamsulosin 0.4 MG Caps capsule Commonly known as: FLOMAX Take 0.4 mg by mouth 2 (two) times daily.   traMADol 50 MG tablet Commonly known as: ULTRAM Take 50 mg by mouth every 6 (six) hours as needed.   Vitamin D3 125 MCG (5000 UT) Tabs Take 5,000 Units by mouth daily.               Durable Medical Equipment  (From admission, onward)           Start     Ordered   09/02/21 1219  DME Oxygen  Once       Question Answer Comment  Length of Need Lifetime   Mode or (Route) Nasal cannula    Liters per Minute 4   Frequency Continuous (stationary and portable oxygen unit needed)   Oxygen delivery system Gas      09/02/21 1218   09/02/21 1202  DME 3-in-1  Once        09/02/21 1202            Disposition and follow-up:   Donald Ellis was discharged from [facility] in Stable condition.  At the hospital follow up visit please address:  RLL community-acquired pneumonia, likely bacterial, inconsistent with viral pneumonia.  Discharge patient on 2 days of cefdinir 300 mg PO BID and 2 L O2 nasal cannula. COVID. Tested positive 9/22.  Discharge patient on 6 days of Decadron 6 mg. Vaccinations.  Flu, pneumonia, COVID after 90 days, any other that he is missing COPD.  If he needs refills on his Symbicort, Labs / imaging needed at time of follow-up: CBC, CMP Pending labs/ test needing follow-up: None  Follow-up Appointments:  Follow-up Information     Christain Sacramento, MD. Go in 1 week(s).   Specialty: Family Medicine Why: Please make an appointment and go to your family doctor within 1 week to make sure that you are properly better! Contact information: [address] [place] [address] [phone]  Care, [facility] Follow up.   Specialty: [place] Why: for home health needs, they will call Tuesday to schedule a time to have first home appointment Contact information: [place] [address] [phone]         Care, [facility] Follow up.   Why: HOME OXYGEN. For any problems call Jermaine at [phone] Contact information: [address] [place] [address] [place] Hospital Course by problem list: Donald Ellis is a 87 y.o. male with a pertinent PMH of COPD, bladder outlet obstruction status post TURP and prostate cancer with metastasis to bone, who presented with weakness and chest tightness for about 1 week, then admitted for  sepsis 2/2 pneumonia and concurrent COVID infection. Length of hospital stay: 3days  In ED, code sepsis with fever, tachycardia, tachypnea, mild lactic acidosis, 4L O2 [place]. Lactic acidosis is continued to trend up after 1L LR bolus and on maintenance fluids. CXR revealed RLL infiltrate, COVID positive. Obtained MRSA PCR of nares, strep and Legionella urine Ag, UA and culture, blood cultures.  Empirically started on vancomycin and cefepime, received 1 dose of Flagyl.  During hospitalization, patient was initially treated empirically for CAP with vancomycin, Flagyl, cefepime, then switched to cefdinir.  Also started on Decadron for COVID.  He required 4 L supplemental O2 nasal cannula.  6-minute walk test showed you stable on 2 L O2 nasal cannula.  Discharged patient on 2 days of cefdinir 300 mg twice daily, 6 days of Decadron 6 mg, and 2 L O2 nasal cannula.  Plan: #Sepsis 2/2 right lower lobe pneumonia with concurrent COVID-19 He was confused on admission, however next day was A&O x4.  He was started on broad-spectrum antibiotics: vancomycin, cefepime, and 1 dose of Flagyl in the ED. Antibiotics was then narrowed down to cefdinir.  History of COPD, and was COVID-positive.  Started him on Decadron 6 mg daily.   #Prostate cancer with metastasis to bone Son was not aware of the details but knows patient is undergoing treatment. Confirmed in PCP notes, but unable to see urologist notes. He has had TURP.  Alk phos is elevated and imaging here shows incidental findings of osseous metastatic lesions in the skull and in the spine. GGT 28 suggests that Alk phos elevated from metastatic lesions in the bone rather than hepatic.    #Hyponatremia (resolved) Mild and asymptomatic on admission, likely from dehydration.  Discharge Exam:   BP (!) 190/93 (BP Location: Left Arm)   Pulse (!) 59   Temp 97.7 F (36.5 C) (Oral)   Resp 17   Ht $R'5\' 10"'Dj$  (1.778 m)   Wt 69.9 kg   SpO2 99%   BMI 22.11 kg/m   Physical  Exam Vitals and nursing note reviewed.  HENT:     Head: Normocephalic and atraumatic.  Eyes:     Extraocular Movements: Extraocular movements intact.  Cardiovascular:     Rate and Rhythm: Normal rate and regular rhythm.     Heart sounds: Normal heart sounds.  Pulmonary:     Effort: Pulmonary effort is normal. No respiratory distress or retractions.     Breath sounds: Decreased air movement present. No wheezing or rales.     Comments: Comfortable on 2 L nasal cannula Abdominal:     General: Abdomen is flat.     Palpations: Abdomen is soft.  Musculoskeletal:  General: Normal range of motion.     Cervical back: Normal range of motion.  Skin:    General: Skin is warm and dry.  Neurological:     General: No focal deficit present.     Mental Status: He is alert and oriented to person, place, and time.  Psychiatric:        Mood and Affect: Mood normal.        Behavior: Behavior normal.     Pertinent Labs, Studies, and Procedures:  DG Pelvis 1-2 Views  Result Date: 08/30/2021 CLINICAL DATA:  Fall, pelvic pain EXAM: PELVIS - 1-2 VIEW COMPARISON:  03/11/2021 FINDINGS: There is no evidence of pelvic fracture or diastasis. Evaluation of the sacrum is somewhat limited by overlying stool and bowel gas. Innumerable sclerotic osseous metastatic lesions throughout the pelvis, visualized lower lumbar spine, and bilateral femurs. IMPRESSION: 1. No evidence of acute pelvic fracture or diastasis. 2. Widespread sclerotic osseous metastatic disease. Electronically Signed   By: Davina Poke D.O.   On: 08/30/2021 12:06   CT HEAD WO CONTRAST (5MM)  Result Date: 08/30/2021 CLINICAL DATA:  Head trauma, fall EXAM: CT HEAD WITHOUT CONTRAST TECHNIQUE: Contiguous axial images were obtained from the base of the skull through the vertex without intravenous contrast. COMPARISON:  None. FINDINGS: Brain: No evidence of acute infarction, hemorrhage, hydrocephalus, extra-axial collection or mass lesion/mass  effect. Mild low-density changes within the periventricular and subcortical white matter compatible with chronic microvascular ischemic change. Mild diffuse cerebral volume loss. Vascular: Atherosclerotic calcifications involving the large vessels of the skull base. No unexpected hyperdense vessel. Skull: Scattered sclerotic osseous metastatic lesions at the skull base. Negative for acute calvarial fracture. Sinuses/Orbits: Mild mucosal thickening involving the maxillary sinuses, ethmoid air cells, and left sphenoid sinus. Other: Negative for scalp hematoma. IMPRESSION: 1. No acute intracranial findings. 2. Mild chronic microvascular ischemic change and cerebral volume loss. 3. Scattered sclerotic osseous metastatic lesions at the skull base. Electronically Signed   By: Davina Poke D.O.   On: 08/30/2021 12:09   DG Chest Port 1 View  Result Date: 08/30/2021 CLINICAL DATA:  Questionable sepsis EXAM: PORTABLE CHEST 1 VIEW COMPARISON:  None. FINDINGS: The cardiomediastinal silhouette is normal. There is patchy opacity in the right lower lobe. There is no other focal consolidation. There is no pulmonary edema. There is no significant pleural effusion. There is no pneumothorax. There is no acute osseous abnormality. There is degenerative change of both shoulders. Multiple remote left-sided rib fractures are seen. IMPRESSION: Right lower lobe pneumonia. Recommend follow-up radiographs in 6-8 weeks to assess for resolution. Electronically Signed   By: Valetta Mole M.D.   On: 08/30/2021 11:10     Discharge Instructions:   Discharge Instructions      Dear Donald Ellis, I am so glad you are feeling better and can be discharged today! You were admitted because of confusion from pneumonia and COVID at the same time.   Please see the following instructions: For your: Pneumonia Please finish the last 2 days of antibiotics (cefdinir 300 mg twice daily) Please visit your family doctor in about 1 week, to get  some blood work to make sure that you do not have the infection anymore. Also recommend getting the flu, pneumonia, and COVID-vaccine's 90 days after discharge. COVID. Please finish the last 6 days of your steroid (Decadron 6 mg daily) He were tested positive on 9/22, please continue to isolate and wear a mask until 10/2 to prevent spreading. COPD Continue using your Symbicort  twice daily, and do not forget to rinse your mouth out afterwards.  Suggestion, place it near your toothbrush, and use it before pressure 2.  It was a pleasure meeting you, Donald Ellis.  I wish you and your family the best, and hope you stay happy and healthy!    Signed: Merrily Brittle, DO Psychiatry Resident, PGY-1 [facility] Teaching Service 09/02/2021, 1:04 PM   Pager: [phone]

## 2021-09-01 LAB — CBC WITH DIFFERENTIAL/PLATELET
Abs Immature Granulocytes: 0 10*3/uL (ref 0.00–0.07)
Basophils Absolute: 0 10*3/uL (ref 0.0–0.1)
Basophils Relative: 0 %
Eosinophils Absolute: 0 10*3/uL (ref 0.0–0.5)
Eosinophils Relative: 0 %
HCT: 38.8 % — ABNORMAL LOW (ref 39.0–52.0)
Hemoglobin: 12.9 g/dL — ABNORMAL LOW (ref 13.0–17.0)
Lymphocytes Relative: 2 %
Lymphs Abs: 0.2 10*3/uL — ABNORMAL LOW (ref 0.7–4.0)
MCH: 32.2 pg (ref 26.0–34.0)
MCHC: 33.2 g/dL (ref 30.0–36.0)
MCV: 96.8 fL (ref 80.0–100.0)
Monocytes Absolute: 0.2 10*3/uL (ref 0.1–1.0)
Monocytes Relative: 2 %
Neutro Abs: 11.1 10*3/uL — ABNORMAL HIGH (ref 1.7–7.7)
Neutrophils Relative %: 96 %
Platelets: 144 10*3/uL — ABNORMAL LOW (ref 150–400)
RBC: 4.01 MIL/uL — ABNORMAL LOW (ref 4.22–5.81)
RDW: 14.9 % (ref 11.5–15.5)
WBC: 11.6 10*3/uL — ABNORMAL HIGH (ref 4.0–10.5)
nRBC: 0 % (ref 0.0–0.2)
nRBC: 0 /100 WBC

## 2021-09-01 LAB — COMPREHENSIVE METABOLIC PANEL
ALT: 17 U/L (ref 0–44)
AST: 28 U/L (ref 15–41)
Albumin: 2.4 g/dL — ABNORMAL LOW (ref 3.5–5.0)
Alkaline Phosphatase: 146 U/L — ABNORMAL HIGH (ref 38–126)
Anion gap: 5 (ref 5–15)
BUN: 17 mg/dL (ref 8–23)
CO2: 31 mmol/L (ref 22–32)
Calcium: 9.3 mg/dL (ref 8.9–10.3)
Chloride: 101 mmol/L (ref 98–111)
Creatinine, Ser: 0.74 mg/dL (ref 0.61–1.24)
GFR, Estimated: >60 mL/min (ref >60)
Glucose, Bld: 141 mg/dL — ABNORMAL HIGH (ref 70–99)
Potassium: 4.1 mmol/L (ref 3.5–5.1)
Sodium: 137 mmol/L (ref 135–145)
Total Bilirubin: 0.7 mg/dL (ref 0.3–1.2)
Total Protein: 6.1 g/dL — ABNORMAL LOW (ref 6.5–8.1)

## 2021-09-01 LAB — C-REACTIVE PROTEIN: CRP: 20.9 mg/dL — ABNORMAL HIGH (ref <1.0)

## 2021-09-01 LAB — FERRITIN: Ferritin: 232 ng/mL (ref 24–336)

## 2021-09-01 LAB — PHOSPHORUS: Phosphorus: 2.3 mg/dL — ABNORMAL LOW (ref 2.5–4.6)

## 2021-09-01 LAB — D-DIMER, QUANTITATIVE: D-Dimer, Quant: 1.9 ug/mL-FEU — ABNORMAL HIGH (ref 0.00–0.50)

## 2021-09-01 LAB — MAGNESIUM: Magnesium: 2.1 mg/dL (ref 1.7–2.4)

## 2021-09-01 LAB — URINE CULTURE: Culture: NO GROWTH

## 2021-09-01 NOTE — Progress Notes (Signed)
Patient Saturations on Room Air at Rest = 93%  Patient Saturations on [employer] while Ambulating = 87%  Patient Saturations on 4 Liters of oxygen while Ambulating = 96%

## 2021-09-01 NOTE — Progress Notes (Signed)
HD#2 SUBJECTIVE:  Patient Summary: Donald Ellis is a 87 y.o. male with a pertinent PMH of COPD, bladder outlet obstruction status post TURP and prostate cancer with metastasis to bone, who presented with weakness and chest tightness for about 1 week, then admitted for sepsis 2/2 pneumonia and concurrent COVID infection.  Overnight Events: Tachypneic overnight  Patient was seen this AM sitting in bed comfortably and was A&Ox4. Noted thick, yellow sputum in container next to bedside. Patient continues to endorse feeling much better and improving shortness of breath. However he still has significant productive cough. Otherwise no other concerns. He denies chest pain, abdominal pain, worsening cough, and worsening shortness of breath.  OBJECTIVE:  Vital Signs: Vitals:   08/31/21 2053 09/01/21 0047 09/01/21 0450 09/01/21 1226  BP: (!) 123/58  (!) 172/81 137/70  Pulse: 89  73 78  Resp:   19 17  Temp: 99.8 F (37.7 C)  97.9 F (36.6 C) 98.4 F (36.9 C)  TempSrc: Oral  Oral Oral  SpO2:  (!) 77% 92% 99%  Weight:      Height:       Supplemental O2: Nasal Cannula SpO2: 99 % O2 Flow Rate (L/min): 4 L/min  Filed Weights   08/30/21 1025  Weight: 69.9 kg    Intake/Output Summary (Last 24 hours) at 09/01/2021 1235 Last data filed at 09/01/2021 1228 Gross per 24 hour  Intake 300 ml  Output 5550 ml  Net -5250 ml    Net IO Since Admission: -3,386.22 mL [09/01/21 1235]  Physical Exam Vitals and nursing note reviewed.  Constitutional:      General: He is not in acute distress.    Appearance: He is not toxic-appearing or diaphoretic.  HENT:     Head: Normocephalic and atraumatic.  Eyes:     Extraocular Movements: Extraocular movements intact.  Cardiovascular:     Rate and Rhythm: Normal rate and regular rhythm.  Pulmonary:     Effort: Pulmonary effort is normal. Prolonged expiration present. No respiratory distress (On 4L O2 [place]).     Breath sounds: Decreased air movement present.  No wheezing.     Comments: Crackles in RLL Musculoskeletal:     Cervical back: Normal range of motion.  Skin:    General: Skin is warm and dry.  Neurological:     Mental Status: He is alert and oriented to person, place, and time.   Pertinent Labs: CBC Latest Ref Rng & Units 09/01/2021 08/31/2021 08/30/2021  WBC 4.0 - 10.5 K/uL 11.6(H) 15.2(H) 12.7(H)  Hemoglobin 13.0 - 17.0 g/dL 12.9(L) 12.2(L) 14.4  Hematocrit 39.0 - 52.0 % 38.8(L) 36.0(L) 42.7  Platelets 150 - 400 K/uL 144(L) 143(L) 152    CMP Latest Ref Rng & Units 09/01/2021 08/31/2021 08/30/2021  Glucose 70 - 99 mg/dL 141(H) 117(H) 108(H)  BUN 8 - 23 mg/dL 17 20 28(H)  Creatinine 0.61 - 1.24 mg/dL 0.74 0.74 0.96  Sodium 135 - 145 mmol/L 137 133(L) 132(L)  Potassium 3.5 - 5.1 mmol/L 4.1 3.9 4.0  Chloride 98 - 111 mmol/L 101 99 94(L)  CO2 22 - 32 mmol/L $RemoveB'31 27 25  'kmDQGipO$ Calcium 8.9 - 10.3 mg/dL 9.3 8.9 8.9  Total Protein 6.5 - 8.1 g/dL 6.1(L) 5.6(L) 7.0  Total Bilirubin 0.3 - 1.2 mg/dL 0.7 0.9 1.1  Alkaline Phos 38 - 126 U/L 146(H) 152(H) 229(H)  AST 15 - 41 U/L 28 29 33  ALT 0 - 44 U/L $Remo'17 16 17    'dXnQQ$ Recent Labs    08/30/21  [place]     Pertinent Imaging: No results found.  ASSESSMENT/PLAN:  Assessment: Active Problems:   Pneumonia   COVID-19   Sepsis ([place])  Donald Ellis is a 88 y.o. male with a pertinent PMH of COPD, bladder outlet obstruction status post TURP and prostate cancer with metastasis to bone, who presented with weakness and chest tightness for about 1 week, then admitted for sepsis 2/2 pneumonia and concurrent COVID infection. HD#2  Plan: #Sepsis 2/2 RLL pneumonia with concurrent COVID-19 (Day 2) COVID positive on 08/30/2021. VSS, afebrile (received Tylenol), 4 L nasal cannula, WBC 11.6, ANC 11.1.  Up trending inflammatory labs, we will continue to follow. If patient's hypoxia worsens would consider starting the patient on baricitinib. Strep pneumo Ag negative.  - Pending blood culture and  sensitivity - Pending urine culture - [place] Legionella - Continue Cefepime IV 2 g q8h - Continue Decadron 6 mg p.o. - Continue home Dulera inhaler - Trend CBC - Trend inflammatory lab (CRP, D-dimer, ferritin)   #Prostate cancer with metastasis to bone Son was not aware of the details but knows patient is undergoing treatment. Confirmed in PCP notes, but unable to see urologist notes. He has had TURP.  Alk phos is elevated and imaging here shows incidental findings of osseous metastatic lesions in the skull and in the spine. GGT 28 suggests that Alk phos elevated from metastatic lesions in the bone rather than hepatic.   Best Practice: Diet: Normal IVF: None,None VTE:  Lovenox 40 mg Code: Full Code  PT/OT recs: Pending, none. TOC recs: Pending  Prior to Admission Living Arrangement: Home Anticipated Discharge Location: TBD  Signature: Merrily Brittle, D.O. Psychiatry Resident, PGY-1 [facility] Teaching [facility] Pager: [phone] 12:35 PM, 09/01/2021   Please contact the on call pager after 5 pm and on weekends at [phone].

## 2021-09-01 NOTE — Plan of Care (Signed)

## 2021-09-02 DIAGNOSIS — J189 Pneumonia, unspecified organism: Secondary | ICD-10-CM

## 2021-09-02 LAB — COMPREHENSIVE METABOLIC PANEL
ALT: 23 U/L (ref 0–44)
AST: 34 U/L (ref 15–41)
Albumin: 2.3 g/dL — ABNORMAL LOW (ref 3.5–5.0)
Alkaline Phosphatase: 136 U/L — ABNORMAL HIGH (ref 38–126)
Anion gap: 6 (ref 5–15)
BUN: 22 mg/dL (ref 8–23)
CO2: 30 mmol/L (ref 22–32)
Calcium: 9.2 mg/dL (ref 8.9–10.3)
Chloride: 101 mmol/L (ref 98–111)
Creatinine, Ser: 0.73 mg/dL (ref 0.61–1.24)
GFR, Estimated: >60 mL/min (ref >60)
Glucose, Bld: 142 mg/dL — ABNORMAL HIGH (ref 70–99)
Potassium: 4 mmol/L (ref 3.5–5.1)
Sodium: 137 mmol/L (ref 135–145)
Total Bilirubin: 0.4 mg/dL (ref 0.3–1.2)
Total Protein: 5.9 g/dL — ABNORMAL LOW (ref 6.5–8.1)

## 2021-09-02 LAB — CBC WITH DIFFERENTIAL/PLATELET
Abs Immature Granulocytes: 0.05 10*3/uL (ref 0.00–0.07)
Basophils Absolute: 0 10*3/uL (ref 0.0–0.1)
Basophils Relative: 0 %
Eosinophils Absolute: 0 10*3/uL (ref 0.0–0.5)
Eosinophils Relative: 0 %
HCT: 37.7 % — ABNORMAL LOW (ref 39.0–52.0)
Hemoglobin: 12.9 g/dL — ABNORMAL LOW (ref 13.0–17.0)
Immature Granulocytes: 1 %
Lymphocytes Relative: 8 %
Lymphs Abs: 0.8 10*3/uL (ref 0.7–4.0)
MCH: 32.8 pg (ref 26.0–34.0)
MCHC: 34.2 g/dL (ref 30.0–36.0)
MCV: 95.9 fL (ref 80.0–100.0)
Monocytes Absolute: 0.4 10*3/uL (ref 0.1–1.0)
Monocytes Relative: 4 %
Neutro Abs: 8.7 10*3/uL — ABNORMAL HIGH (ref 1.7–7.7)
Neutrophils Relative %: 87 %
Platelets: 156 10*3/uL (ref 150–400)
RBC: 3.93 MIL/uL — ABNORMAL LOW (ref 4.22–5.81)
RDW: 14.3 % (ref 11.5–15.5)
WBC: 10 10*3/uL (ref 4.0–10.5)
nRBC: 0 % (ref 0.0–0.2)

## 2021-09-02 LAB — MAGNESIUM: Magnesium: 2 mg/dL (ref 1.7–2.4)

## 2021-09-02 LAB — D-DIMER, QUANTITATIVE: D-Dimer, Quant: 1.42 ug/mL-FEU — ABNORMAL HIGH (ref 0.00–0.50)

## 2021-09-02 LAB — FERRITIN: Ferritin: 173 ng/mL (ref 24–336)

## 2021-09-02 LAB — C-REACTIVE PROTEIN: CRP: 10.5 mg/dL — ABNORMAL HIGH (ref <1.0)

## 2021-09-02 LAB — PHOSPHORUS: Phosphorus: 2.6 mg/dL (ref 2.5–4.6)

## 2021-09-02 MED ORDER — DEXAMETHASONE 6 MG PO TABS: 6.0000 mg | ORAL_TABLET | ORAL | 0 refills | Status: AC

## 2021-09-02 MED ORDER — CEFDINIR 300 MG PO CAPS: 300.0000 mg | ORAL_CAPSULE | Freq: Two times a day (BID) | ORAL | Status: DC

## 2021-09-02 MED ORDER — CEFDINIR 300 MG PO CAPS: 300.0000 mg | ORAL_CAPSULE | Freq: Two times a day (BID) | ORAL | 0 refills | Status: AC

## 2021-09-02 MED ORDER — CEFDINIR 300 MG PO CAPS
300.0000 mg | ORAL_CAPSULE | Freq: Two times a day (BID) | ORAL | Status: DC
  Filled 2021-09-02 (×2): qty 1

## 2021-09-02 NOTE — Evaluation (Signed)
Physical Therapy Evaluation Patient Details Name: Donald Ellis MRN: [id number] DOB: [birth year] Today's Date: 09/02/2021  History of Present Illness  Patient is a 87 y/o male who presents with weakness, chest tightness and recent fall on 08/31/21. Found to have sepsis in the setting of RLL PNA and Covid-19. PMH includes COPD, bladder outlet obstruction status post TURP, prostate ca with mets to the bone, urinary retention.  Clinical Impression  Patient presents with generalized weakness, decreased endurance, dyspnea on exertion, impaired balance and impaired mobility s/p above. Pt reports being independent for ADLs/IADLs and drives PTA, also helping wife as needed. Today, pt requires Min guard-supervision for transfers, gait and ADL session at sink with use of RW. SP02 dropped to high 70s on RA during activity and maintained >88% on 2L/min 02 [place] with activity. Pt will need supplemental 02 for home use. Education re: pursed lip breathing and importance of use of RW at home to decrease fall risk. Will follow acutely to maximize independence and mobility prior to return home. Recommend HHPT follow up to improve overall strength, endurance and safe mobility so pt can return to PLOF.     Recommendations for follow up therapy are one component of a multi-disciplinary discharge planning process, led by the attending physician.  Recommendations may be updated based on patient status, additional functional criteria and insurance authorization.  Follow Up Recommendations Home health PT;Supervision - Intermittent    Equipment Recommendations  None recommended by PT    Recommendations for Other Services       Precautions / Restrictions Precautions Precautions: Fall;Other (comment) Precaution Comments: watch 02 Restrictions Weight Bearing Restrictions: No      Mobility  Bed Mobility Overal bed mobility: Needs Assistance Bed Mobility: Supine to Sit     Supine to sit: Min guard;HOB elevated      General bed mobility comments: HOB elevated, increased time.    Transfers Overall transfer level: Needs assistance Equipment used: Rolling walker (2 wheeled) Transfers: Sit to/from Stand Sit to Stand: Min guard;Min assist         General transfer comment: Min A initially progressing to Min guard, cues for hand placement as pt pulling up on RW for support. Stood from [employer]. Transferred to chair post ambulation.  Ambulation/Gait Ambulation/Gait assistance: Min guard Gait Distance (Feet): 20 Feet (x2 bouts) Assistive device: Rolling walker (2 wheeled) Gait Pattern/deviations: Step-through pattern;Decreased stride length;Trunk flexed;Decreased step length - right;Decreased step length - left Gait velocity: decreased   General Gait Details: SLow, mildly unsteady gait with use of RW; slow short steps. 2/4 DOE. Sp02 dropped to high 70s on RA, donned 2L and Sp02 stayed >88%.  Stairs            Wheelchair Mobility    Modified Rankin (Stroke Patients Only)       Balance Overall balance assessment: Needs assistance;History of Falls Sitting-balance support: Feet supported;No upper extremity supported Sitting balance-Leahy Scale: Good     Standing balance support: During functional activity Standing balance-Leahy Scale: Fair Standing balance comment: Able to stand at sink and brush teeth with supervision holding into counter intermittently.                             Pertinent Vitals/Pain Pain Assessment: No/denies pain    Home Living Family/patient expects to be discharged to:: Private residence Living Arrangements: Spouse/significant other Available Help at Discharge: Family;Available PRN/intermittently Type of Home: House Home Access: Stairs to enter  Entrance Stairs-Number of Steps: 2 Home Layout: One level Home Equipment: [place] - 2 wheels;Cane - single point;Shower seat;Grab bars - tub/shower;Walker - 4 wheels Additional Comments: Lives with wife  whom he assists, reports she may have early dementia. Daughter assists as needed with IADLs too    Prior Function Level of Independence: Independent         Comments: no use of AD for mobility. Independent with ADLs, IADLs, still drives and grocery shops. Enjoys gardening and yard work. Pt and daughter assist wife with bathing tasks, etc.     Hand Dominance   Dominant Hand: Right    Extremity/Trunk Assessment   Upper Extremity Assessment Upper Extremity Assessment: Defer to OT evaluation    Lower Extremity Assessment Lower Extremity Assessment: Generalized weakness (but functional)    Cervical / Trunk Assessment Cervical / Trunk Assessment: Kyphotic  Communication   Communication: HOH  Cognition Arousal/Alertness: Awake/alert Behavior During Therapy: WFL for tasks assessed/performed Overall Cognitive Status: Difficult to assess                                 General Comments: Follows commands well when he can hear. "How would you feel if you have been latying in this bed for 1 week and not been up?" when pt has only been here 2 days.      General Comments General comments (skin integrity, edema, etc.): SP02 dropped to high 70s on RA during activity and maintained >88% on 2L.min 02 Independence.    Exercises     Assessment/Plan    PT Assessment Patient needs continued PT services  PT Problem List Decreased strength;Decreased mobility;Cardiopulmonary status limiting activity;Decreased activity tolerance;Decreased balance       PT Treatment Interventions Therapeutic exercise;DME instruction;Gait training;Functional mobility training;Therapeutic activities;Patient/family education;Stair training;Balance training    PT Goals (Current goals can be found in the Care Plan section)  Acute Rehab PT Goals Patient Stated Goal: to go home now PT Goal Formulation: With patient Time For Goal Achievement: 09/16/21 Potential to Achieve Goals: Good    Frequency Min  3X/week   Barriers to discharge        Co-evaluation               AM-PAC PT "6 Clicks" Mobility  Outcome Measure Help needed turning from your back to your side while in a flat bed without using bedrails?: A Little Help needed moving from lying on your back to sitting on the side of a flat bed without using bedrails?: A Little Help needed moving to and from a bed to a chair (including a wheelchair)?: A Little Help needed standing up from a chair using your arms (e.g., wheelchair or bedside chair)?: A Little Help needed to walk in hospital room?: A Little Help needed climbing 3-5 steps with a railing? : A Little 6 Click Score: 18    End of Session Equipment Utilized During Treatment: Gait belt;Oxygen Activity Tolerance: Treatment limited secondary to medical complications (Comment);Patient tolerated treatment well (drop in SP02) Patient left: in chair;with call bell/phone within reach;with chair alarm set;with family/visitor present Nurse Communication: Mobility status PT Visit Diagnosis: Muscle weakness (generalized) (M62.81);Other (comment) (dysnea)    Time: 1962-2297 PT Time Calculation (min) (ACUTE ONLY): 28 min   Charges:   PT Evaluation $PT Eval Moderate Complexity: 1 Mod PT Treatments $Therapeutic Activity: 8-22 mins        Marisa Severin, PT, DPT Acute Rehabilitation Services Pager  [phone] Office [phone]     Lacie Draft 09/02/2021, 9:40 AM

## 2021-09-02 NOTE — Plan of Care (Signed)

## 2021-09-02 NOTE — Evaluation (Signed)
Occupational Therapy Evaluation Patient Details Name: Donald Ellis MRN: [id number] DOB: 1934-09-25 Today's Date: 09/02/2021   History of Present Illness Patient is a 87 y/o male who presents with weakness, chest tightness and recent fall on 08/31/21. Found to have sepsis in the setting of RLL PNA and Covid-19. PMH includes COPD, bladder outlet obstruction status post TURP, prostate ca with mets to the bone, urinary retention.   Clinical Impression   PTA patient was living with his daughter and spouse in a private residence and was grossly I with ADLs/IADLs without AD. Patient and his daughter are primary caregivers for his wife. Patient currently functioning below baseline demonstrating observed ADLs including ADL transfers with supervision to [place] guard with use of RW. Patient also limited by deficits listed below including decreased activity tolerance, generalized weakness and decrease cardiopulmonary status and would benefit from continued acute OT services in prep for safe d/c home with family. OT provided education on need for supervision A for tub/shower transfers and use of 3-in-1 for energy conservation and to reduce risk of falls. Son expressed verbal understanding. OT will continue to follow acutely.      Recommendations for follow up therapy are one component of a multi-disciplinary discharge planning process, led by the attending physician.  Recommendations may be updated based on patient status, additional functional criteria and insurance authorization.   Follow Up Recommendations  Home health OT;Supervision - Intermittent (Supervision A for tub/shower transfers initially)    Equipment Recommendations  3 in 1 bedside commode    Recommendations for Other Services       Precautions / Restrictions Precautions Precautions: Fall;Other (comment) Precaution Comments: watch 02 Restrictions Weight Bearing Restrictions: No      Mobility Bed Mobility Overal bed mobility: Needs  Assistance Bed Mobility: Supine to Sit     Supine to sit: Min guard;HOB elevated     General bed mobility comments: Patient seated EOB upon entry.    Transfers Overall transfer level: Needs assistance Equipment used: Rolling walker (2 wheeled) Transfers: Sit to/from Stand Sit to Stand: Min guard         General transfer comment: Min guard from recliner and from standard height commode in bathroom. Cues for hand placement.    Balance Overall balance assessment: Needs assistance;History of Falls Sitting-balance support: Feet supported;No upper extremity supported Sitting balance-Leahy Scale: Good     Standing balance support: During functional activity Standing balance-Leahy Scale: Fair Standing balance comment: Able to stand at sink and brush teeth with supervision holding into counter intermittently.                           ADL either performed or assessed with clinical judgement   ADL Overall ADL's : Needs assistance/impaired Eating/Feeding: Independent   Grooming: Supervision/safety;Standing Grooming Details (indicate cue type and reason): Cues for safety with RW. Upper Body Bathing: Set up;Sitting   Lower Body Bathing: Min guard;Sit to/from stand Lower Body Bathing Details (indicate cue type and reason): Able to doff/don footwear seated in recliner without external assist. Upper Body Dressing : Set up;Sitting   Lower Body Dressing: Min guard;Sit to/from stand   Toilet Transfer: Consulting civil engineer Details (indicate cue type and reason): Standard height commode in bathroom. Cues for hand placement/safety; +grab bar.                 Vision Patient Visual Report: No change from baseline Vision Assessment?: No apparent visual deficits     Perception  Praxis      Pertinent Vitals/Pain Pain Assessment: No/denies pain     Hand Dominance Right   Extremity/Trunk Assessment Upper Extremity Assessment Upper Extremity  Assessment: Overall WFL for tasks assessed   Lower Extremity Assessment Lower Extremity Assessment: Generalized weakness   Cervical / Trunk Assessment Cervical / Trunk Assessment: Kyphotic   Communication Communication Communication: HOH   Cognition Arousal/Alertness: Awake/alert Behavior During Therapy: WFL for tasks assessed/performed Overall Cognitive Status: Difficult to assess                                 General Comments: HOH; patient follows 1-step verbal commands when able to understand therapist with increased volume; patient easily frustrated with participation in OT eval   General Comments  SpO2 >88% with mild activity on 2L O2 via [place].    Exercises     Shoulder Instructions      Home Living Family/patient expects to be discharged to:: Private residence Living Arrangements: Spouse/significant other Available Help at Discharge: Family;Available PRN/intermittently Type of Home: House Home Access: Stairs to enter [employer] of Steps: 2   Home Layout: One level     Bathroom Shower/Tub: Tub/shower unit         Home Equipment: Environmental consultant - 2 wheels;Cane - single point;Shower seat;Grab bars - tub/shower;Walker - 4 wheels   Additional Comments: Lives with wife whom he assists, reports she may have early dementia. Daughter assists as needed with IADLs too      Prior Functioning/Environment Level of Independence: Independent        Comments: no use of AD for mobility. Independent with ADLs, IADLs, still drives and grocery shops. Enjoys gardening and yard work. Pt and daughter assist wife with bathing tasks, etc.        OT Problem List: Decreased strength;Impaired balance (sitting and/or standing);Decreased activity tolerance;Decreased cognition;Decreased safety awareness;Decreased knowledge of use of DME or AE;Cardiopulmonary status limiting activity      OT Treatment/Interventions: Self-care/ADL training;Therapeutic exercise;Energy  conservation;DME and/or AE instruction;Therapeutic activities;Patient/family education;Balance training    OT Goals(Current goals can be found in the care plan section) Acute Rehab OT Goals Patient Stated Goal: to go home now OT Goal Formulation: With patient Time For Goal Achievement: 09/16/21 Potential to Achieve Goals: Good ADL Goals Pt Will Perform Grooming: with modified independence;standing Pt Will Perform Upper Body Dressing: Independently;sitting Pt Will Perform Lower Body Dressing: with modified independence;sitting/lateral leans Pt Will Transfer to Toilet: with modified independence;ambulating Pt Will Perform Toileting - Clothing Manipulation and hygiene: with modified independence;sit to/from stand Pt Will Perform Tub/Shower Transfer: Tub transfer;3 in 1;grab bars;rolling walker Pt/caregiver will Perform Home Exercise Program: Increased strength;Both right and left upper extremity  OT Frequency: Min 2X/week   Barriers to D/C:            Co-evaluation              AM-PAC OT "6 Clicks" Daily Activity     Outcome Measure Help from another person eating meals?: None Help from another person taking care of personal grooming?: A Little Help from another person toileting, which includes using toliet, bedpan, or urinal?: A Little Help from another person bathing (including washing, rinsing, drying)?: A Little Help from another person to put on and taking off regular upper body clothing?: A Little Help from another person to put on and taking off regular lower body clothing?: A Little 6 Click Score: 19   End of  Session Equipment Utilized During Treatment: Gait belt;Oxygen Nurse Communication: Mobility status  Activity Tolerance: Patient tolerated treatment well Patient left: in chair;with call bell/phone within reach;with chair alarm set;with family/visitor present  OT Visit Diagnosis: Unsteadiness on feet (R26.81);Muscle weakness (generalized) (M62.81);History of  falling (Z91.81)                Time: 1027-2536 OT Time Calculation (min): 15 min Charges:  OT General Charges $OT Visit: 1 Visit OT Evaluation $OT Eval Low Complexity: 1 Low  Marquesha Robideau H. OTR/L Supplemental OT, Department of rehab services [phone]  Tully Mcinturff R H. 09/02/2021, 10:17 AM

## 2021-09-02 NOTE — Plan of Care (Signed)

## 2021-09-02 NOTE — Discharge Instructions (Addendum)
Dear [name], I am so glad you are feeling better and can be discharged today! You were admitted because of confusion from pneumonia and COVID at the same time.   Please see the following instructions: For your: Pneumonia Please finish the last 2 days of antibiotics (cefdinir 300 mg twice daily) Please visit your family doctor in about 1 week, to get some blood work to make sure that you do not have the infection anymore. Also recommend getting the flu, pneumonia, and COVID-vaccine's 90 days after discharge. COVID. Please finish the last 6 days of your steroid (Decadron 6 mg daily) He were tested positive on 9/22, please continue to isolate and wear a mask until 10/2 to prevent spreading. COPD Continue using your Symbicort twice daily, and do not forget to rinse your mouth out afterwards.  Suggestion, place it near your toothbrush, and use it before pressure 2.  It was a pleasure meeting you, [name].  I wish you and your family the best, and hope you stay happy and healthy!

## 2021-09-02 NOTE — TOC Transition Note (Addendum)
Transition of Care [facility]) - CM/SW Discharge Note   Patient Details  Name: Donald Ellis MRN: [id number] Date of Birth: 08-26-1934  Transition of Care [facility]) CM/SW Contact:  Carles Collet, RN Phone Number: 09/02/2021, 12:35 PM   Clinical Narrative:    Damaris Schooner w patient's son Konrad Dolores who answered room phone. Discussed dispo plan and needs and resources. Patient has RW at home, will need 3/1, ordered and requested for delivery to room. Will need home oxygen, ordered and it will be delivered to room. DME coming through [place]. [employer] will also set up O2 concentrator at home later today. Patient may leave from [facility] standpoint when DME is delivered to room. HH has been set up w [facility].  No other TOC needs identified  Patient will DC to [address] [place] [address] where he lives with his wife and daughter Collie Siad.  Sohan, [place]   [phone]  Joyce,Sherry Daughter   [phone]  Joyce,Sue Daughter   [phone]     Final next level of care: Home w Home Health Services Barriers to Discharge: No Barriers Identified   Patient Goals and CMS Choice Patient states their goals for this hospitalization and ongoing recovery are:: to go home CMS Medicare.gov Compare Post Acute Care list provided to:: Other (Comment Required) Choice offered to / list presented to : Adult Children  Discharge Placement                       Discharge Plan and Services                DME Arranged: 3-N-1, Oxygen DME Agency: [employer] Date DME Agency Contacted: 09/02/21 Time DME Agency Contacted: 1232 Representative spoke with at DME Agency: [facility] HH Arranged: PT, OT [place] Agency: [place] Date [place]: 09/02/21 Time [place]: 1233 Representative spoke with at [place]: [place] ([place]) Interventions     Readmission Risk Interventions No flowsheet data found.

## 2021-09-03 LAB — LEGIONELLA PNEUMOPHILA SEROGP 1 UR AG: L. pneumophila Serogp 1 Ur Ag: NEGATIVE

## 2021-09-03 NOTE — Telephone Encounter (Signed)
I replied to pharmacy no refill.

## 2021-09-04 LAB — CULTURE, BLOOD (ROUTINE X 2)
Culture: NO GROWTH
Culture: NO GROWTH
Special Requests: ADEQUATE

## 2022-02-14 ENCOUNTER — Other Ambulatory Visit: Payer: Self-pay

## 2022-02-14 ENCOUNTER — Observation Stay (HOSPITAL_COMMUNITY): Payer: Medicare Other

## 2022-02-14 ENCOUNTER — Emergency Department (HOSPITAL_COMMUNITY): Payer: Medicare Other

## 2022-02-14 ENCOUNTER — Observation Stay (HOSPITAL_COMMUNITY)
Admission: EM | Admit: 2022-02-14 | Discharge: 2022-02-15 | Disposition: A | Discharge status: Home / Self Care | Payer: Medicare Other | Attending: Internal Medicine | Admitting: Internal Medicine

## 2022-02-14 ENCOUNTER — Encounter (HOSPITAL_COMMUNITY): Payer: Self-pay | Admitting: Emergency Medicine

## 2022-02-14 DIAGNOSIS — Z7951 Long term (current) use of inhaled steroids: Secondary | ICD-10-CM | POA: Insufficient documentation

## 2022-02-14 DIAGNOSIS — Z20822 Contact with and (suspected) exposure to covid-19: Secondary | ICD-10-CM | POA: Insufficient documentation

## 2022-02-14 DIAGNOSIS — C7989 Secondary malignant neoplasm of other specified sites: Secondary | ICD-10-CM | POA: Diagnosis present

## 2022-02-14 DIAGNOSIS — J449 Chronic obstructive pulmonary disease, unspecified: Secondary | ICD-10-CM | POA: Insufficient documentation

## 2022-02-14 DIAGNOSIS — I7143 Infrarenal abdominal aortic aneurysm, without rupture: Secondary | ICD-10-CM | POA: Diagnosis not present

## 2022-02-14 DIAGNOSIS — Z87891 Personal history of nicotine dependence: Secondary | ICD-10-CM | POA: Diagnosis not present

## 2022-02-14 DIAGNOSIS — N400 Enlarged prostate without lower urinary tract symptoms: Secondary | ICD-10-CM | POA: Diagnosis present

## 2022-02-14 DIAGNOSIS — R42 Dizziness and giddiness: Secondary | ICD-10-CM

## 2022-02-14 DIAGNOSIS — Z79899 Other long term (current) drug therapy: Secondary | ICD-10-CM | POA: Diagnosis not present

## 2022-02-14 DIAGNOSIS — Z8546 Personal history of malignant neoplasm of prostate: Secondary | ICD-10-CM | POA: Diagnosis not present

## 2022-02-14 DIAGNOSIS — K922 Gastrointestinal hemorrhage, unspecified: Principal | ICD-10-CM | POA: Diagnosis present

## 2022-02-14 DIAGNOSIS — C61 Malignant neoplasm of prostate: Secondary | ICD-10-CM | POA: Diagnosis present

## 2022-02-14 DIAGNOSIS — R52 Pain, unspecified: Secondary | ICD-10-CM

## 2022-02-14 LAB — COMPREHENSIVE METABOLIC PANEL
ALT: 11 U/L (ref 0–44)
AST: 29 U/L (ref 15–41)
Albumin: 3.2 g/dL — ABNORMAL LOW (ref 3.5–5.0)
Alkaline Phosphatase: 755 U/L — ABNORMAL HIGH (ref 38–126)
Anion gap: 8 (ref 5–15)
BUN: 20 mg/dL (ref 8–23)
CO2: 28 mmol/L (ref 22–32)
Calcium: 9.3 mg/dL (ref 8.9–10.3)
Chloride: 102 mmol/L (ref 98–111)
Creatinine, Ser: 0.75 mg/dL (ref 0.61–1.24)
GFR, Estimated: >60 mL/min (ref >60)
Glucose, Bld: 105 mg/dL — ABNORMAL HIGH (ref 70–99)
Potassium: 3.9 mmol/L (ref 3.5–5.1)
Sodium: 138 mmol/L (ref 135–145)
Total Bilirubin: 0.6 mg/dL (ref 0.3–1.2)
Total Protein: 6.7 g/dL (ref 6.5–8.1)

## 2022-02-14 LAB — LACTIC ACID, PLASMA
Lactic Acid, Venous: 1.2 mmol/L (ref 0.5–1.9)
Lactic Acid, Venous: 1.1 mmol/L (ref 0.5–1.9)

## 2022-02-14 LAB — RESP PANEL BY RT-PCR (FLU A&B, COVID) ARPGX2
Influenza A by PCR: NEGATIVE
Influenza B by PCR: NEGATIVE
SARS Coronavirus 2 by RT PCR: NEGATIVE

## 2022-02-14 LAB — CBC
HCT: 39.7 % (ref 39.0–52.0)
HCT: 37 % — ABNORMAL LOW (ref 39.0–52.0)
HCT: 32.5 % — ABNORMAL LOW (ref 39.0–52.0)
Hemoglobin: 13.1 g/dL (ref 13.0–17.0)
Hemoglobin: 12.1 g/dL — ABNORMAL LOW (ref 13.0–17.0)
Hemoglobin: 10.8 g/dL — ABNORMAL LOW (ref 13.0–17.0)
MCH: 33.3 pg (ref 26.0–34.0)
MCH: 32.9 pg (ref 26.0–34.0)
MCH: 33.3 pg (ref 26.0–34.0)
MCHC: 33 g/dL (ref 30.0–36.0)
MCHC: 32.7 g/dL (ref 30.0–36.0)
MCHC: 33.2 g/dL (ref 30.0–36.0)
MCV: 101 fL — ABNORMAL HIGH (ref 80.0–100.0)
MCV: 100.5 fL — ABNORMAL HIGH (ref 80.0–100.0)
MCV: 100.3 fL — ABNORMAL HIGH (ref 80.0–100.0)
Platelets: 201 10*3/uL (ref 150–400)
Platelets: 181 10*3/uL (ref 150–400)
Platelets: 178 10*3/uL (ref 150–400)
RBC: 3.93 MIL/uL — ABNORMAL LOW (ref 4.22–5.81)
RBC: 3.68 MIL/uL — ABNORMAL LOW (ref 4.22–5.81)
RBC: 3.24 MIL/uL — ABNORMAL LOW (ref 4.22–5.81)
RDW: 12.6 % (ref 11.5–15.5)
RDW: 12.8 % (ref 11.5–15.5)
RDW: 12.8 % (ref 11.5–15.5)
WBC: 8.9 10*3/uL (ref 4.0–10.5)
WBC: 8.1 10*3/uL (ref 4.0–10.5)
WBC: 7.5 10*3/uL (ref 4.0–10.5)
nRBC: 0 % (ref 0.0–0.2)
nRBC: 0 % (ref 0.0–0.2)
nRBC: 0 % (ref 0.0–0.2)

## 2022-02-14 LAB — URINALYSIS, ROUTINE W REFLEX MICROSCOPIC
Bilirubin Urine: NEGATIVE
Glucose, UA: NEGATIVE mg/dL
Hgb urine dipstick: NEGATIVE
Ketones, ur: NEGATIVE mg/dL
Leukocytes,Ua: NEGATIVE
Nitrite: NEGATIVE
Protein, ur: NEGATIVE mg/dL
Specific Gravity, Urine: 1.029 (ref 1.005–1.030)
pH: 6 (ref 5.0–8.0)

## 2022-02-14 LAB — TYPE AND SCREEN
ABO/RH(D): O POS
Antibody Screen: NEGATIVE

## 2022-02-14 LAB — LIPASE, BLOOD: Lipase: 24 U/L (ref 11–51)

## 2022-02-14 LAB — POC OCCULT BLOOD, ED: Fecal Occult Bld: POSITIVE — AB

## 2022-02-14 LAB — PROTIME-INR
INR: 1 (ref 0.8–1.2)
Prothrombin Time: 13.6 seconds (ref 11.4–15.2)

## 2022-02-14 LAB — HEMOGLOBIN: Hemoglobin: 12.8 g/dL — ABNORMAL LOW (ref 13.0–17.0)

## 2022-02-14 MED ORDER — ACETAMINOPHEN 325 MG PO TABS
650.0000 mg | ORAL_TABLET | Freq: Four times a day (QID) | ORAL | Status: DC | PRN
  Administered 2022-02-14 – 2022-02-15 (×3): 650 mg via ORAL
  Filled 2022-02-14 (×3): qty 2

## 2022-02-14 MED ORDER — ACETAMINOPHEN 650 MG RE SUPP: 650.0000 mg | Freq: Four times a day (QID) | RECTAL | Status: DC | PRN

## 2022-02-14 MED ORDER — PANTOPRAZOLE SODIUM 40 MG IV SOLR
40.0000 mg | Freq: Two times a day (BID) | INTRAVENOUS | Status: DC
Start: 2022-02-14 — End: 2022-02-15
  Administered 2022-02-14 – 2022-02-15 (×2): 40 mg via INTRAVENOUS
  Filled 2022-02-14 (×2): qty 10

## 2022-02-14 MED ORDER — MOMETASONE FURO-FORMOTEROL FUM 200-5 MCG/ACT IN AERO
2.0000 | INHALATION_SPRAY | Freq: Two times a day (BID) | RESPIRATORY_TRACT | Status: DC
  Administered 2022-02-15: 2 via RESPIRATORY_TRACT
  Filled 2022-02-14: qty 8.8

## 2022-02-14 MED ORDER — IOHEXOL 350 MG/ML SOLN
100.0000 mL | Freq: Once | INTRAVENOUS | Status: AC | PRN
  Administered 2022-02-14: 11:00:00 75 mL via INTRAVENOUS

## 2022-02-14 MED ORDER — LACTATED RINGERS IV SOLN: INTRAVENOUS | Status: DC

## 2022-02-14 MED ORDER — ALBUTEROL SULFATE HFA 108 (90 BASE) MCG/ACT IN AERS
2.0000 | INHALATION_SPRAY | RESPIRATORY_TRACT | Status: DC | PRN
  Filled 2022-02-14: qty 6.7

## 2022-02-14 MED ORDER — IOHEXOL 350 MG/ML SOLN
75.0000 mL | Freq: Once | INTRAVENOUS | Status: AC | PRN
  Administered 2022-02-14: 17:00:00 75 mL via INTRAVENOUS

## 2022-02-14 NOTE — ED Notes (Signed)
Pt ambulatory to bathroom

## 2022-02-14 NOTE — Consult Note (Signed)
Consultation  Referring Provider:   Dr. Sherry Ruffing   Primary Care Physician:  Christain Sacramento, MD Primary Gastroenterologist:  Dr. Loletha Carrow       Reason for Consultation: Hematochezia            HPI:   Donald Ellis is a 88 y.o. male with a past medical history as listed below including previous GI bleed, who presented to the ER today with hematochezia.    Today, the patient is seen with his daughter by his bedside who provides all of the history as he is very hard of hearing.  She explains that he started "pouring out blood" this morning around 4 AM when he got up to have a bowel movement.  She tells me it has been every hour almost on the hour since then and oftentimes he is dripping blood on his way to the toilet.  She tells me over the past few days he has started to complain of an off-and-on right-sided abdominal pain but this is new.  No new medications.  Does discuss his last GI bleed but this was black stool and all she is seeing now is bright red blood.    Denies fever, chills, weight loss or change in bowel habits including reported constipation.     ER course: Hemoglobin 13.1 on 02/14/2022 at 950 in the morning--> 12.8 at 205, BUN normal at 20, CTAP with contrast with sigmoid diverticulosis without evidence of diverticulitis, prominent stool throughout the colon, small bilateral inguinal hernias containing fat, diffuse sclerotic osseous metastasis progressive since previous exam especially in the right pelvis, fusiform 3.6 x 3.6 abdominal aortic aneurysm, 3.3 cm in length, aortic atherosclerosis and emphysema, Hemoccult positive  GI history: 04/25/2021 office visit with Dr. Loletha Carrow: Follow-up for bleeding gastric ulcer, that time was doing well and continued on Pantoprazole 40 twice daily for 8 weeks, recommended a repeat EGD in 6 to 8 weeks to assess ulcer healing and biopsy to confirm H. pylori eradication, also repeated labs that time including CBC, iron studies, B12 and folate-of note  patient's daughter called back and decided not to schedule EGD 03/12/21-03/14/2021 EGD with normal esophagus, nonbleeding gastric ulcers with a visible vessel injected and treated with bipolar cautery and APC, nonbleeding gastric ulcers with no stigmata of bleeding and normal duodenum, at that time admitted and continued on Protonix drip for 36 to 48 hours; pathology showed H. pylori-recommended quadruple therapy on discharge  Past Medical History:  Diagnosis Date   Foley catheter in place    LAST CHANGED 07-04-2001   HOH (hard of hearing)    Urinary retention     Past Surgical History:  Procedure Laterality Date   BIOPSY  03/12/2021   Procedure: BIOPSY;  Surgeon: Doran Stabler, MD;  Location: [place];  Service: Gastroenterology;;   CYSTOSCOPY WITH INSERTION OF UROLIFT N/A 07/01/2019   Procedure: CYSTOSCOPY WITH INSERTION OF UROLIFT;  Surgeon: Cleon Gustin, MD;  Location: [facility];  Service: Urology;  Laterality: N/A;   ESOPHAGOGASTRODUODENOSCOPY (EGD) WITH PROPOFOL N/A 03/12/2021   Procedure: ESOPHAGOGASTRODUODENOSCOPY (EGD) WITH PROPOFOL;  Surgeon: Doran Stabler, MD;  Location: [place];  Service: Gastroenterology;  Laterality: N/A;   HOT HEMOSTASIS N/A 03/12/2021   Procedure: HOT HEMOSTASIS (ARGON PLASMA COAGULATION/BICAP);  Surgeon: Doran Stabler, MD;  Location: [place];  Service: Gastroenterology;  Laterality: N/A;   SCLEROTHERAPY  03/12/2021   Procedure: SCLEROTHERAPY;  Surgeon: Doran Stabler, MD;  Location: [facility]  ENDOSCOPY;  Service: Gastroenterology;;   TRANSURETHRAL RESECTION OF PROSTATE N/A 08/30/2019   Procedure: TRANSURETHRAL RESECTION OF THE PROSTATE (TURP);  Surgeon: Cleon Gustin, MD;  Location: [facility];  Service: Urology;  Laterality: N/A;  2 HRS    Family History  Problem Relation Age of Onset   GI Bleed Neg Hx     Social History   Tobacco Use   Smoking status: Former   Smokeless tobacco: Never    Tobacco comments:    Hasn't smoked in 40 years  Vaping Use   Vaping Use: Never used  Substance Use Topics   Alcohol use: Not Currently   Drug use: Never    Prior to Admission medications   Medication Sig Start Date End Date Taking? Authorizing Provider  acetaminophen (TYLENOL) 650 MG CR tablet Take 650 mg by mouth every 8 (eight) hours as needed for pain.    [provider]  albuterol (VENTOLIN HFA) 108 (90 Base) MCG/ACT inhaler Inhale 2 puffs into the lungs every 4 (four) hours as needed for wheezing or shortness of breath. 02/26/21   [provider]  Cholecalciferol (VITAMIN D3) 125 MCG (5000 UT) TABS Take 5,000 Units by mouth daily.    [provider]  diphenhydramine-acetaminophen (TYLENOL PM) 25-500 MG TABS tablet Take 1 tablet by mouth at bedtime.    [provider]  pantoprazole (PROTONIX) 40 MG tablet TAKE 1 TABLET BY MOUTH TWICE A DAY 07/23/21   Doran Stabler, MD  SYMBICORT 160-4.5 MCG/ACT inhaler Inhale 1 puff into the lungs 2 (two) times daily. 01/24/21   [provider]  tamsulosin (FLOMAX) 0.4 MG CAPS capsule Take 0.4 mg by mouth 2 (two) times daily. 09/21/20   [provider]  traMADol (ULTRAM) 50 MG tablet Take 50 mg by mouth every 6 (six) hours as needed. 08/21/21   [provider]    No current facility-administered medications for this encounter.   Current Outpatient Medications  Medication Sig Dispense Refill   acetaminophen (TYLENOL) 650 MG CR tablet Take 650 mg by mouth every 8 (eight) hours as needed for pain.     albuterol (VENTOLIN HFA) 108 (90 Base) MCG/ACT inhaler Inhale 2 puffs into the lungs every 4 (four) hours as needed for wheezing or shortness of breath.     Cholecalciferol (VITAMIN D3) 125 MCG (5000 UT) TABS Take 5,000 Units by mouth daily.     diphenhydramine-acetaminophen (TYLENOL PM) 25-500 MG TABS tablet Take 1 tablet by mouth at bedtime.     pantoprazole (PROTONIX) 40 MG tablet TAKE 1  TABLET BY MOUTH TWICE A DAY 60 tablet 0   SYMBICORT 160-4.5 MCG/ACT inhaler Inhale 1 puff into the lungs 2 (two) times daily.     tamsulosin (FLOMAX) 0.4 MG CAPS capsule Take 0.4 mg by mouth 2 (two) times daily.     traMADol (ULTRAM) 50 MG tablet Take 50 mg by mouth every 6 (six) hours as needed.      Allergies as of 02/14/2022 - Review Complete 02/14/2022  Allergen Reaction Noted   Hydrocodone-acetaminophen Itching 09/10/2019     Review of Systems:    Constitutional: No weight loss, fever or chills Skin: No rash  Cardiovascular: No chest pain  Respiratory: No SOB Gastrointestinal: See HPI and otherwise negative Genitourinary: No dysuria  Neurological: No headache, dizziness or syncope Musculoskeletal: No new muscle or joint pain Hematologic: No bruising Psychiatric: No history of depression or anxiety    Physical Exam:  Vital signs in last  24 hours: Temp:  [98.7 F (37.1 C)] 98.7 F (37.1 C) (03/09 0918) Pulse Rate:  [64-90] 79 (03/09 1330) Resp:  [16-26] 16 (03/09 1330) BP: (128-163)/(73-89) 129/77 (03/09 1330) SpO2:  [94 %-98 %] 96 % (03/09 1330)   General:   Pleasant Elderly Caucasian male appears to be in NAD, Well developed, Well nourished, alert and cooperative Head:  Normocephalic and atraumatic. Eyes:   PEERL, EOMI. No icterus. Conjunctiva pink. Ears:  Very hard of hearing Neck:  Supple Throat: Oral cavity and pharynx without inflammation, swelling or lesion.  Lungs: Respirations even and unlabored. Lungs clear to auscultation bilaterally.   No wheezes, crackles, or rhonchi.  Heart: Normal S1, S2. No MRG. Regular rate and rhythm. No peripheral edema, cyanosis or pallor.  Abdomen:  Soft, nondistended, nontender. No rebound or guarding. Normal bowel sounds. No appreciable masses or hepatomegaly. Rectal: External: No signs of bleeding, no hemorrhoids; Internal: Small amount of bright red blood residue on the glove, no mass Msk:  Symmetrical without gross deformities.  Peripheral pulses intact.  Extremities:  Without edema, no deformity or joint abnormality.  Neurologic:  Alert and  oriented x4;  grossly normal neurologically.  Skin:   Dry and intact without significant lesions or rashes. Psychiatric: Demonstrates good judgement and reason without abnormal affect or behaviors.  LAB RESULTS: Recent Labs    02/14/22 0950 02/14/22 1405  WBC 8.9  --   HGB 13.1 12.8*  HCT 39.7  --   PLT 201  --    BMET Recent Labs    02/14/22 0950  NA 138  K 3.9  CL 102  CO2 28  GLUCOSE 105*  BUN 20  CREATININE 0.75  CALCIUM 9.3   LFT Recent Labs    02/14/22 0950  PROT 6.7  ALBUMIN 3.2*  AST 29  ALT 11  ALKPHOS 755*  BILITOT 0.6   PT/INR Recent Labs    02/14/22 0950  LABPROT 13.6  INR 1.0    STUDIES: CT ABDOMEN PELVIS W CONTRAST  Result Date: 02/14/2022 CLINICAL DATA:  RIGHT side and lower quadrant pain, severe abdominal pain, rectal bleeding today, fatigue; history of prostate cancer per prior imaging exams EXAM: CT ABDOMEN AND PELVIS WITH CONTRAST TECHNIQUE: Multidetector CT imaging of the abdomen and pelvis was performed using the standard protocol following bolus administration of intravenous contrast. RADIATION DOSE REDUCTION: This exam was performed according to the departmental dose-optimization program which includes automated exposure control, adjustment of the mA and/or kV according to patient size and/or use of iterative reconstruction technique. CONTRAST:  55m OMNIPAQUE IOHEXOL 350 MG/ML SOLN IV. No oral contrast. COMPARISON:  03/11/2021 FINDINGS: Lower chest: Lung bases emphysematous but clear Hepatobiliary: Gallbladder and liver normal appearance Pancreas: Normal appearance Spleen: Normal appearance Adrenals/Urinary Tract: Adrenal glands, kidneys, ureters, and bladder normal appearance Stomach/Bowel: Normal appendix. Prominent stool throughout colon. Sigmoid diverticulosis without evidence of diverticulitis. Vascular/Lymphatic:  Atherosclerotic calcifications aorta and iliac arteries. Aneurysmal dilatation mid abdominal aorta 3.6 x 3.6 cm image 42, previously 3.3 cm, fusiform, currently approximately 3.3 cm length. Common iliac arteries prominent in size bilaterally 16 mm RIGHT and 14 mm LEFT. No adenopathy. Reproductive: Mildly enlarged prostate gland with clips. Other: Small BILATERAL inguinal hernias containing fat. No free air or free fluid. Musculoskeletal: Diffuse sclerotic osseous metastases progressive since previous exam especially in RIGHT pelvis. Degenerative disc disease changes thoracolumbar spine with dextroconvex scoliosis. IMPRESSION: Sigmoid diverticulosis without evidence of diverticulitis. Prominent stool throughout colon. Small BILATERAL inguinal hernias containing fat. Diffuse sclerotic osseous metastases  progressive since previous exam especially in RIGHT pelvis. Fusiform 3.6 x 3.6 cm abdominal aortic aneurysm, 3.3 cm length Recommend follow-up ultrasound every 2 years. This recommendation follows ACR consensus guidelines: White Paper of the ACR Incidental Findings Committee II on Vascular Findings. J Am Coll Radiol 2013; 10:789-794. Aortic Atherosclerosis (ICD10-I70.0). Emphysema (ICD10-J43.9). Aortic aneurysm NOS (ICD10-I71.9). Electronically Signed   By: Lavonia Dana M.D.   On: 02/14/2022 11:38     Impression / Plan:   Impression: 1.  Hematochezia: Describes dripping bright red blood from rectum and a large amount of bright red blood whenever he goes to the bathroom, now it is just blood about every hour since around 4:00 this morning, interestingly hemoglobin and BUN stable; consider diverticular bleed most likely vs stercoral ulcer vs radiation proctitis 2.  Prostate cancer: With mets to the bone, no ongoing chemotherapy 3.  Fatty liver 4.  3.3 cm infrarenal AAA, stable 5.  Elevated alk phos: 136 on 09/02/2021 --> 755 this admission, likely related to bone mets  Plan: 1.  We will keep the patient on a  clear liquid diet this afternoon and n.p.o. at midnight. 2.  Pending how the patient does overnight may need to do a flex sig tomorrow afternoon. 3.  Continue to monitor hemoglobin every 8 with transfusion as needed less than 7 4.  If patient has large GI bleed or large drop in hemoglobin would recommend proceeding with CTA, I do not think this is necessary right now.  Thank you for your kind consultation, we will continue to follow.  Lavone Nian Antonyo Hinderer  02/14/2022, 3:04 PM

## 2022-02-14 NOTE — ED Provider Notes (Signed)
[facility] EMERGENCY DEPARTMENT Provider Note   CSN: [id number] Arrival date & time: 02/14/22  6773     History  Chief Complaint  Patient presents with   GI Bleeding    Donald Ellis is a 88 y.o. male.  The history is provided by the patient and medical records. No language interpreter was used.  Rectal Bleeding Quality:  Bright red Amount:  Copious Duration:  1 day Timing:  Intermittent Chronicity:  New Similar prior episodes: no   Relieved by:  Nothing Worsened by:  Nothing Ineffective treatments:  None tried Associated symptoms: abdominal pain and light-headedness   Associated symptoms: no dizziness, no epistaxis, no fever, no hematemesis, no loss of consciousness, no recent illness and no vomiting       Home Medications Prior to Admission medications   Medication Sig Start Date End Date Taking? Authorizing Provider  acetaminophen (TYLENOL) 650 MG CR tablet Take 650 mg by mouth every 8 (eight) hours as needed for pain.    [provider]  albuterol (VENTOLIN HFA) 108 (90 Base) MCG/ACT inhaler Inhale 2 puffs into the lungs every 4 (four) hours as needed for wheezing or shortness of breath. 02/26/21   [provider]  Cholecalciferol (VITAMIN D3) 125 MCG (5000 UT) TABS Take 5,000 Units by mouth daily.    [provider]  diphenhydramine-acetaminophen (TYLENOL PM) 25-500 MG TABS tablet Take 1 tablet by mouth at bedtime.    [provider]  pantoprazole (PROTONIX) 40 MG tablet TAKE 1 TABLET BY MOUTH TWICE A DAY 07/23/21   Sherrilyn Rist, MD  SYMBICORT 160-4.5 MCG/ACT inhaler Inhale 1 puff into the lungs 2 (two) times daily. 01/24/21   [provider]  tamsulosin (FLOMAX) 0.4 MG CAPS capsule Take 0.4 mg by mouth 2 (two) times daily. 09/21/20   [provider]  traMADol (ULTRAM) 50 MG tablet Take 50 mg by mouth every 6 (six) hours as needed. 08/21/21   [provider]      Allergies     Hydrocodone-acetaminophen    Review of Systems   Review of Systems  Constitutional:  Positive for fatigue. Negative for chills, diaphoresis and fever.  HENT:  Negative for nosebleeds.   Eyes:  Negative for visual disturbance.  Respiratory:  Negative for chest tightness.   Cardiovascular:  Negative for chest pain.  Gastrointestinal:  Positive for abdominal pain and hematochezia. Negative for abdominal distention, constipation, diarrhea, hematemesis, nausea and vomiting.  Genitourinary:  Negative for dysuria and enuresis.  Musculoskeletal:  Negative for back pain, neck pain and neck stiffness.  Skin:  Negative for rash and wound.  Neurological:  Positive for light-headedness. Negative for dizziness, loss of consciousness, syncope, weakness and headaches.  Psychiatric/Behavioral:  Negative for agitation and confusion.   All other systems reviewed and are negative.  Physical Exam Updated Vital Signs BP (!) 163/89 (BP Location: Right Arm)    Pulse 86    Temp 98.7 F (37.1 C) (Oral)    Resp 16    SpO2 97%  Physical Exam Vitals and nursing note reviewed.  Constitutional:      General: He is not in acute distress.    Appearance: He is well-developed. He is not ill-appearing.  HENT:     Head: Normocephalic and atraumatic.     Nose: Nose normal.     Mouth/Throat:     Mouth: Mucous membranes are moist.     Pharynx: No oropharyngeal exudate or posterior oropharyngeal erythema.  Eyes:  Extraocular Movements: Extraocular movements intact.     Conjunctiva/sclera: Conjunctivae normal.     Pupils: Pupils are equal, round, and reactive to light.  Cardiovascular:     Rate and Rhythm: Normal rate and regular rhythm.     Heart sounds: No murmur heard. Pulmonary:     Effort: Pulmonary effort is normal. No respiratory distress.     Breath sounds: Normal breath sounds. No wheezing, rhonchi or rales.  Chest:     Chest wall: No tenderness.  Abdominal:     General: Abdomen is flat.      Palpations: Abdomen is soft.     Tenderness: There is abdominal tenderness. There is no right CVA tenderness or left CVA tenderness.  Genitourinary:    Rectum: Guaiac result positive.  Musculoskeletal:        General: No swelling or tenderness.     Cervical back: Neck supple.  Skin:    General: Skin is warm and dry.     Capillary Refill: Capillary refill takes less than 2 seconds.     Findings: No erythema.  Neurological:     General: No focal deficit present.     Mental Status: He is alert.  Psychiatric:        Mood and Affect: Mood normal.    ED Results / Procedures / Treatments   Labs (all labs ordered are listed, but only abnormal results are displayed) Labs Reviewed  COMPREHENSIVE METABOLIC PANEL - Abnormal; Notable for the following components:      Result Value   Glucose, Bld 105 (*)    Albumin 3.2 (*)    Alkaline Phosphatase 755 (*)    All other components within normal limits  CBC - Abnormal; Notable for the following components:   RBC 3.93 (*)    MCV 101.0 (*)    All other components within normal limits  HEMOGLOBIN - Abnormal; Notable for the following components:   Hemoglobin 12.8 (*)    All other components within normal limits  POC OCCULT BLOOD, ED - Abnormal; Notable for the following components:   Fecal Occult Bld POSITIVE (*)    All other components within normal limits  URINE CULTURE  LIPASE, BLOOD  PROTIME-INR  LACTIC ACID, PLASMA  LACTIC ACID, PLASMA  URINALYSIS, ROUTINE W REFLEX MICROSCOPIC  CBC  CBC  CBC  TYPE AND SCREEN    EKG EKG Interpretation  Date/Time:  Thursday February 14 2022 09:21:45 EST Ventricular Rate:  86 PR Interval:  167 QRS Duration: 89 QT Interval:  393 QTC Calculation: 471 R Axis:   64 Text Interpretation: Sinus rhythm when compared to prior, similar appearance with more wandering baseline. No STEMI Confirmed by Antony Blackbird [phone]) on 02/14/2022 9:24:29 AM  Radiology CT ABDOMEN PELVIS W CONTRAST  Result Date:  02/14/2022 CLINICAL DATA:  RIGHT side and lower quadrant pain, severe abdominal pain, rectal bleeding today, fatigue; history of prostate cancer per prior imaging exams EXAM: CT ABDOMEN AND PELVIS WITH CONTRAST TECHNIQUE: Multidetector CT imaging of the abdomen and pelvis was performed using the standard protocol following bolus administration of intravenous contrast. RADIATION DOSE REDUCTION: This exam was performed according to the departmental dose-optimization program which includes automated exposure control, adjustment of the mA and/or kV according to patient size and/or use of iterative reconstruction technique. CONTRAST:  37mL OMNIPAQUE IOHEXOL 350 MG/ML SOLN IV. No oral contrast. COMPARISON:  03/11/2021 FINDINGS: Lower chest: Lung bases emphysematous but clear Hepatobiliary: Gallbladder and liver normal appearance Pancreas: Normal appearance Spleen: Normal appearance Adrenals/Urinary Tract:  Adrenal glands, kidneys, ureters, and bladder normal appearance Stomach/Bowel: Normal appendix. Prominent stool throughout colon. Sigmoid diverticulosis without evidence of diverticulitis. Vascular/Lymphatic: Atherosclerotic calcifications aorta and iliac arteries. Aneurysmal dilatation mid abdominal aorta 3.6 x 3.6 cm image 42, previously 3.3 cm, fusiform, currently approximately 3.3 cm length. Common iliac arteries prominent in size bilaterally 16 mm RIGHT and 14 mm LEFT. No adenopathy. Reproductive: Mildly enlarged prostate gland with clips. Other: Small BILATERAL inguinal hernias containing fat. No free air or free fluid. Musculoskeletal: Diffuse sclerotic osseous metastases progressive since previous exam especially in RIGHT pelvis. Degenerative disc disease changes thoracolumbar spine with dextroconvex scoliosis. IMPRESSION: Sigmoid diverticulosis without evidence of diverticulitis. Prominent stool throughout colon. Small BILATERAL inguinal hernias containing fat. Diffuse sclerotic osseous metastases progressive  since previous exam especially in RIGHT pelvis. Fusiform 3.6 x 3.6 cm abdominal aortic aneurysm, 3.3 cm length Recommend follow-up ultrasound every 2 years. This recommendation follows ACR consensus guidelines: White Paper of the ACR Incidental Findings Committee II on Vascular Findings. J Am Coll Radiol 2013; 10:789-794. Aortic Atherosclerosis (ICD10-I70.0). Emphysema (ICD10-J43.9). Aortic aneurysm NOS (ICD10-I71.9). Electronically Signed   By: Lavonia Dana M.D.   On: 02/14/2022 11:38    Procedures Procedures    Medications Ordered in ED Medications  albuterol (VENTOLIN HFA) 108 (90 Base) MCG/ACT inhaler 2 puff (has no administration in time range)  mometasone-formoterol (DULERA) 200-5 MCG/ACT inhaler 2 puff (has no administration in time range)  lactated ringers infusion (has no administration in time range)  acetaminophen (TYLENOL) tablet 650 mg (has no administration in time range)    Or  acetaminophen (TYLENOL) suppository 650 mg (has no administration in time range)  iohexol (OMNIPAQUE) 350 MG/ML injection 100 mL (75 mLs Intravenous Contrast Given 02/14/22 1105)    ED Course/ Medical Decision Making/ A&P                           Medical Decision Making Amount and/or Complexity of Data Reviewed Labs: ordered. Radiology: ordered.  Risk Prescription drug management. Decision regarding hospitalization.   Donald Ellis is a 88 y.o. male with a past medical history significant for previous gastric ulcer requiring hemostasis, previous TURP, and hard of hearing who presents with 1 week of right sided abdominal pain and 1 morning of bright rectal bleeding.  According to patient, he had some pain in his right abdomen for the last week or so but has had normal bowel movements until this morning.  He reports he woke up early this morning and has had 4 bowel movements with bright red blood that fills up the toilet.  He says that he had no difference with his urination.  He denies nausea or  vomiting.  He reports no history of hemorrhoids or rectal pain but has bright blood that keeps filling up the toilet.  He also felt fatigued and lightheaded today but denies chest pain or shortness of breath.  Denies palpitations or syncope.  Denies any fevers, chills, [place], cough or other torso complaints.  He denies history of pain like this.  On exam, lungs clear and chest nontender.  Abdomen was tender on the right side and right flank.  Rectal exam performed with chaperone and there was what appeared to be gross blood.  It was sent for fecal occult testing.  No hemorrhoids seen.  No rectal tenderness.  Back and flanks nontender.  Patient otherwise resting.  Due to the abdominal pain with all this bleeding, we will get CT abdomen  pelvis to look for ischemia or obstruction or other cause of acute abdominal pain and bleeding.  We will get screening labs.  Due to the continued bleeding in the emergency department, anticipate trending of hemoglobin if it is not low initially.  We will also get urinalysis and other screening labs.  Anticipate reassessment after work-up   Patient walked to bathroom with nurses assistance and he had bright rectal bleeding coming from his rectum.  He continues to have rectal bleeding in the emergency department.  3:04 PM [facility] was called and they reviewed the case.  They agree it is concerning that his alk phos has jumped up significantly and continues to have all the rectal bleeding.  They agree that he needs a medicine admission and they will see in consultation for likely scoping.  We will call unassigned medicine team for admission.         Final Clinical Impression(s) / ED Diagnoses Final diagnoses:  Acute GI bleeding  Lightheadedness     Clinical Impression: 1. Acute GI bleeding   2. Lightheadedness     Disposition: Admit  This note was prepared with assistance of Systems analyst. Occasional wrong-word or sound-a-like  substitutions may have occurred due to the inherent limitations of voice recognition software.     Kairee Kozma, Gwenyth Allegra, MD 02/14/22 [phone]

## 2022-02-14 NOTE — H&P (Addendum)
History and Physical    Donald Ellis MIW:[id number] DOB: [birth year] DOA: 02/14/2022  PCP: Christain Sacramento, MD  Patient coming from: Home.  Chief Complaint: Rectal bleeding.  HPI: Donald Ellis is a 88 y.o. male with prior history of peptic ulcer disease with hematemesis, prostate cancer, BPH, COPD was experiencing multiple episodes of frank rectal bleeding since this morning.  Did not have any nausea vomiting.  Did have right lower quadrant abdominal pain.  ED Course: In the ER patient is hemodynamically stable.  CT abdomen pelvis does show some metastatic lesions to the pelvis.  CT angiogram of the abdomen did not show any active bleeding.  Dr. Tarri Glenn of [facility] was consulted.  At this time they are suspecting the GI bleed to be likely from diverticulosis.  Review of Systems: As per HPI, rest all negative.   Past Medical History:  Diagnosis Date   Foley catheter in place    LAST CHANGED 07-04-2001   HOH (hard of hearing)    Urinary retention     Past Surgical History:  Procedure Laterality Date   BIOPSY  03/12/2021   Procedure: BIOPSY;  Surgeon: Doran Stabler, MD;  Location: [place];  Service: Gastroenterology;;   CYSTOSCOPY WITH INSERTION OF [place] N/A 07/01/2019   Procedure: CYSTOSCOPY WITH INSERTION OF UROLIFT;  Surgeon: Cleon Gustin, MD;  Location: [facility];  Service: Urology;  Laterality: N/A;   ESOPHAGOGASTRODUODENOSCOPY (EGD) WITH PROPOFOL N/A 03/12/2021   Procedure: ESOPHAGOGASTRODUODENOSCOPY (EGD) WITH PROPOFOL;  Surgeon: Doran Stabler, MD;  Location: [place];  Service: Gastroenterology;  Laterality: N/A;   HOT HEMOSTASIS N/A 03/12/2021   Procedure: HOT HEMOSTASIS (ARGON PLASMA COAGULATION/BICAP);  Surgeon: Doran Stabler, MD;  Location: [place];  Service: Gastroenterology;  Laterality: N/A;   SCLEROTHERAPY  03/12/2021   Procedure: SCLEROTHERAPY;  Surgeon: Doran Stabler, MD;  Location: [place];  Service:  Gastroenterology;;   TRANSURETHRAL RESECTION OF PROSTATE N/A 08/30/2019   Procedure: TRANSURETHRAL RESECTION OF THE PROSTATE (TURP);  Surgeon: Cleon Gustin, MD;  Location: [facility];  Service: Urology;  Laterality: N/A;  2 HRS     reports that he has quit smoking. He has never used smokeless tobacco. He reports that he does not currently use alcohol. He reports that he does not use drugs.  Allergies  Allergen Reactions   Hydrocodone-Acetaminophen Itching    Itching and tingling without a rash    Family History  Problem Relation Age of Onset   GI Bleed Neg Hx     Prior to Admission medications   Medication Sig Start Date End Date Taking? Authorizing Provider  acetaminophen (TYLENOL) 650 MG CR tablet Take 650 mg by mouth every 8 (eight) hours as needed for pain.    [provider]  albuterol (VENTOLIN HFA) 108 (90 Base) MCG/ACT inhaler Inhale 2 puffs into the lungs every 4 (four) hours as needed for wheezing or shortness of breath. 02/26/21   [provider]  Cholecalciferol (VITAMIN D3) 125 MCG (5000 UT) TABS Take 5,000 Units by mouth daily.    [provider]  diphenhydramine-acetaminophen (TYLENOL PM) 25-500 MG TABS tablet Take 1 tablet by mouth at bedtime.    [provider]  pantoprazole (PROTONIX) 40 MG tablet TAKE 1 TABLET BY MOUTH TWICE A DAY 07/23/21   Doran Stabler, MD  SYMBICORT 160-4.5 MCG/ACT inhaler Inhale 1 puff into the lungs 2 (two) times daily. 01/24/21   [provider]  tamsulosin (FLOMAX) 0.4 MG CAPS capsule Take 0.4 mg by mouth 2 (two) times daily. 09/21/20   [provider]  traMADol (ULTRAM) 50 MG tablet Take 50 mg by mouth every 6 (six) hours as needed. 08/21/21   [provider]    Physical Exam: Constitutional: Moderately built and nourished. Vitals:   02/14/22 1100 02/14/22 1230 02/14/22 1300 02/14/22 1330  BP: 133/85 139/80 128/73 129/77  Pulse: 79 75 79 79  Resp: '18  17 19 16  '$ Temp:      TempSrc:      SpO2: 96% 98% 94% 96%   Eyes: Anicteric no pallor. ENMT: No discharge from the ears eyes nose and mouth. Neck: No mass felt.  No neck rigidity. Respiratory: No rhonchi or crepitations. Cardiovascular: S1-S2 heard. Abdomen: Soft nontender bowel sound present. Musculoskeletal: No edema. Skin: No rash. Neurologic: Alert awake oriented to his name and place moving all extremities. Psychiatric: Oriented to name and place.   Labs on Admission: I have personally reviewed following labs and imaging studies  CBC: Recent Labs  Lab 02/14/22 0950 02/14/22 1405  WBC 8.9  --   HGB 13.1 12.8*  HCT 39.7  --   MCV 101.0*  --   PLT 201  --    Basic Metabolic Panel: Recent Labs  Lab 02/14/22 0950  NA 138  K 3.9  CL 102  CO2 28  GLUCOSE 105*  BUN 20  CREATININE 0.75  CALCIUM 9.3   GFR: CrCl cannot be calculated (Unknown ideal weight.). Liver Function Tests: Recent Labs  Lab 02/14/22 0950  AST 29  ALT 11  ALKPHOS 755*  BILITOT 0.6  PROT 6.7  ALBUMIN 3.2*   Recent Labs  Lab 02/14/22 0950  LIPASE 24   No results for input(s): AMMONIA in the last 168 hours. Coagulation Profile: Recent Labs  Lab 02/14/22 0950  INR 1.0   Cardiac Enzymes: No results for input(s): CKTOTAL, CKMB, CKMBINDEX, TROPONINI in the last 168 hours. BNP (last 3 results) No results for input(s): PROBNP in the last 8760 hours. HbA1C: No results for input(s): HGBA1C in the last 72 hours. CBG: No results for input(s): GLUCAP in the last 168 hours. Lipid Profile: No results for input(s): CHOL, HDL, LDLCALC, TRIG, CHOLHDL, LDLDIRECT in the last 72 hours. Thyroid Function Tests: No results for input(s): TSH, T4TOTAL, FREET4, T3FREE, THYROIDAB in the last 72 hours. Anemia Panel: No results for input(s): VITAMINB12, FOLATE, FERRITIN, TIBC, IRON, RETICCTPCT in the last 72 hours. Urine analysis:    Component Value Date/Time   COLORURINE YELLOW 08/30/2021 1554    APPEARANCEUR CLEAR 08/30/2021 1554   LABSPEC 1.021 08/30/2021 1554   PHURINE 5.0 08/30/2021 1554   GLUCOSEU NEGATIVE 08/30/2021 1554   HGBUR SMALL (A) 08/30/2021 1554   BILIRUBINUR NEGATIVE 08/30/2021 1554   KETONESUR 5 (A) 08/30/2021 1554   PROTEINUR NEGATIVE 08/30/2021 1554   NITRITE NEGATIVE 08/30/2021 1554   LEUKOCYTESUR NEGATIVE 08/30/2021 1554   Sepsis Labs: '@LABRCNTIP'$ (procalcitonin:4,lacticidven:4) )No results found for this or any previous visit (from the past 240 hour(s)).   Radiological Exams on Admission: CT ABDOMEN PELVIS W CONTRAST  Result Date: 02/14/2022 CLINICAL DATA:  RIGHT side and lower quadrant pain, severe abdominal pain, rectal bleeding today, fatigue; history of prostate cancer per prior imaging exams EXAM: CT ABDOMEN AND PELVIS WITH CONTRAST TECHNIQUE: Multidetector CT imaging of the abdomen and pelvis was performed using the standard protocol following bolus administration of intravenous contrast. RADIATION DOSE REDUCTION: This exam was performed according to the departmental  dose-optimization program which includes automated exposure control, adjustment of the mA and/or kV according to patient size and/or use of iterative reconstruction technique. CONTRAST:  57m OMNIPAQUE IOHEXOL 350 MG/ML SOLN IV. No oral contrast. COMPARISON:  03/11/2021 FINDINGS: Lower chest: Lung bases emphysematous but clear Hepatobiliary: Gallbladder and liver normal appearance Pancreas: Normal appearance Spleen: Normal appearance Adrenals/Urinary Tract: Adrenal glands, kidneys, ureters, and bladder normal appearance Stomach/Bowel: Normal appendix. Prominent stool throughout colon. Sigmoid diverticulosis without evidence of diverticulitis. Vascular/Lymphatic: Atherosclerotic calcifications aorta and iliac arteries. Aneurysmal dilatation mid abdominal aorta 3.6 x 3.6 cm image 42, previously 3.3 cm, fusiform, currently approximately 3.3 cm length. Common iliac arteries prominent in size bilaterally 16  mm RIGHT and 14 mm LEFT. No adenopathy. Reproductive: Mildly enlarged prostate gland with clips. Other: Small BILATERAL inguinal hernias containing fat. No free air or free fluid. Musculoskeletal: Diffuse sclerotic osseous metastases progressive since previous exam especially in RIGHT pelvis. Degenerative disc disease changes thoracolumbar spine with dextroconvex scoliosis. IMPRESSION: Sigmoid diverticulosis without evidence of diverticulitis. Prominent stool throughout colon. Small BILATERAL inguinal hernias containing fat. Diffuse sclerotic osseous metastases progressive since previous exam especially in RIGHT pelvis. Fusiform 3.6 x 3.6 cm abdominal aortic aneurysm, 3.3 cm length Recommend follow-up ultrasound every 2 years. This recommendation follows ACR consensus guidelines: White Paper of the ACR Incidental Findings Committee II on Vascular Findings. J Am Coll Radiol 2013; 10:789-794. Aortic Atherosclerosis (ICD10-I70.0). Emphysema (ICD10-J43.9). Aortic aneurysm NOS (ICD10-I71.9). Electronically Signed   By: MLavonia DanaM.D.   On: 02/14/2022 11:38    EKG: Independently reviewed.  Normal sinus rhythm.  Assessment/Plan Principal Problem:   Acute GI bleeding    Acute GI bleeding suspected to be likely lower GI bleed possibly from diverticulosis.  Appreciate GI consult.  We will check serial CBCs transfuse if hemoglobin less than 7 or patient becomes hypotensive.  We will keep patient n.p.o. for now and fluids. Prior history of peptic ulcer disease with bleeding on Protonix. History of COPD not actively wheezing. History of prostate cancer with possible metastasis likely causing the pain.  X-ray of the chest and ribs are pending since patient has some right-sided rib pain.  Per daughter patient was treated for prostate cancer in December with chemo.  We need to get further information on this. CT scan did show a 3.6 cm abdominal aortic aneurysm with chronic segment dissection.  I did discuss with  on-call vascular surgeon Dr. HRoselie Awkwardwho advised to follow-up with him after discharge.  No acute intervention required now.  COVID test pending.   DVT prophylaxis: SCDs. Code Status: DNR. Family Communication: Patient's daughter. Disposition Plan: Home in stable. Consults called: GI. Admission status: Observation.   ARise PatienceMD [facility] Pager [phone]  If 7PM-7AM, please contact night-coverage [email or web address] Password [facility] 02/14/2022, 3:45 PM

## 2022-02-14 NOTE — ED Notes (Signed)
Patient transported to CT, family remains in room. ?

## 2022-02-14 NOTE — ED Notes (Signed)
Received report from [place], Therapist, sports. ?

## 2022-02-14 NOTE — ED Triage Notes (Signed)
Patient here with complaint of bright red rectal bleeding that started today. Patient states he has "filled up his toilet four times today". Patient denies pain, denies emesis. Patient also complains of dizziness. Patient is hard of hearing, alert, oriented, and in no apparent distress at this time. ?

## 2022-02-15 DIAGNOSIS — C61 Malignant neoplasm of prostate: Secondary | ICD-10-CM

## 2022-02-15 DIAGNOSIS — N138 Other obstructive and reflux uropathy: Secondary | ICD-10-CM

## 2022-02-15 DIAGNOSIS — K922 Gastrointestinal hemorrhage, unspecified: Secondary | ICD-10-CM | POA: Diagnosis not present

## 2022-02-15 DIAGNOSIS — N401 Enlarged prostate with lower urinary tract symptoms: Secondary | ICD-10-CM | POA: Diagnosis not present

## 2022-02-15 DIAGNOSIS — C7989 Secondary malignant neoplasm of other specified sites: Secondary | ICD-10-CM | POA: Diagnosis present

## 2022-02-15 LAB — GLUCOSE, CAPILLARY
Glucose-Capillary: 101 mg/dL — ABNORMAL HIGH (ref 70–99)
Glucose-Capillary: 111 mg/dL — ABNORMAL HIGH (ref 70–99)

## 2022-02-15 LAB — CBC
HCT: 32.3 % — ABNORMAL LOW (ref 39.0–52.0)
Hemoglobin: 10.9 g/dL — ABNORMAL LOW (ref 13.0–17.0)
MCH: 34 pg (ref 26.0–34.0)
MCHC: 33.7 g/dL (ref 30.0–36.0)
MCV: 100.6 fL — ABNORMAL HIGH (ref 80.0–100.0)
Platelets: 165 10*3/uL (ref 150–400)
RBC: 3.21 MIL/uL — ABNORMAL LOW (ref 4.22–5.81)
RDW: 12.9 % (ref 11.5–15.5)
WBC: 7.1 10*3/uL (ref 4.0–10.5)
nRBC: 0 % (ref 0.0–0.2)

## 2022-02-15 MED ORDER — DOCUSATE SODIUM 100 MG PO CAPS
100.0000 mg | ORAL_CAPSULE | Freq: Every day | ORAL | 1 refills | Status: AC | PRN
Start: 2022-02-15 — End: 2022-03-17

## 2022-02-15 NOTE — Assessment & Plan Note (Signed)
History of urinary retention in the past.  On tamsulosin as outpatient. ?

## 2022-02-15 NOTE — Assessment & Plan Note (Addendum)
Patient has not had further episodes of bleeding in the hospital.  GI was consulted and had an impression of diverticular bleed.  Patient did have a history of upper GI bleeding in the past with peptic ulceration.  Patient remained hemodynamically stable.  Continued on Protonix.  Hemoglobin has remained stable since yesterday.  GI has seen the patient today and recommend disposition home if remained stable. ?

## 2022-02-15 NOTE — Care Management Obs Status (Signed)
MEDICARE OBSERVATION STATUS NOTIFICATION ? ? ?Patient Details  ?Name: Donald Ellis ?MRN: [id number] ?Date of Birth: 1934-01-12 ? ? ?Medicare Observation Status Notification Given:  Yes ? ? ? ?Bethena Roys, RN ?02/15/2022, 1:31 PM ?

## 2022-02-15 NOTE — Hospital Course (Signed)
Donald Ellis is a 88 y.o. male with past medical history of peptic ulcer disease with hematemesis, prostate cancer, BPH, COPD presented to hospital with multiple episodes of frank red rectal bleed.  In the ED, patient was hemodynamically stable.  CT scan of the abdomen and pelvis showed some metastatic lesions to the pelvis.  CT angiogram of the abdomen did not show any active bleeding.  GI was consulted and patient was thought to be having diverticular bleed.  Patient was then admitted hospital for further observation.   ?

## 2022-02-15 NOTE — Discharge Summary (Addendum)
Physician Discharge Summary   Patient: Donald Ellis MRN: [id number] DOB: December 09, 1934  Admit date:     02/14/2022  Discharge date: 02/15/22  Discharge Physician: Corrie Mckusick Karmah Potocki   PCP: Christain Sacramento, MD   Recommendations at discharge:   Follow-up with your primary care physician in 1 week.   Check CBC at that time.   Follow-up with Dr. 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 *  Hospital Course: Donald Ellis is a 88 y.o. male with past medical history of peptic ulcer disease with hematemesis, prostate cancer, BPH, COPD presented to hospital with multiple episodes of frank red rectal bleed.  In the ED, patient was hemodynamically stable.  CT scan of the abdomen and pelvis showed some metastatic lesions to the pelvis.  CT angiogram of the abdomen did not show any active bleeding.  GI was consulted and patient was thought to be having diverticular bleed.  Patient was then admitted hospital for further observation.    Assessment and Plan: * Acute GI bleeding Patient has not had further episodes of bleeding in the hospital.  GI was consulted and had an impression of diverticular bleed.  Patient did have a history of upper GI bleeding in the past with peptic ulceration.  Patient remained hemodynamically stable.  Continued on Protonix.  Hemoglobin has remained stable since yesterday.  GI has seen the patient today and recommend disposition home if remained stable.  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  Patient was treated for prostate cancer in December with chemotherapy.  CT scan with mention of metastasis to the pelvis.  Recommend follow-up with oncology as outpatient.  BPH  (benign prostatic hyperplasia) History of urinary retention in the past.  On tamsulosin as outpatient.  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 Luan Pulling  CT surgery.  Patient was recommended outpatient follow-up.  Consultants: GI  Procedures performed: None  Disposition: Home.  I spoke with the patient's son at bedside regarding plan for disposition.  Diet recommendation:  Discharge Diet Orders (From admission, onward)     Start     Ordered   02/15/22 0000  Diet - low sodium heart healthy        02/15/22 1338           Regular diet  DISCHARGE MEDICATION: Allergies as of 02/15/2022       Reactions   Hydrocodone-acetaminophen Itching   Itching and tingling without a rash        Medication List     TAKE these medications    acetaminophen 650 MG CR tablet Commonly known as: TYLENOL Take 650 mg by mouth every 8 (eight) hours as needed for pain.   albuterol 108 (90 Base) MCG/ACT inhaler Commonly known as: VENTOLIN HFA Inhale 2 puffs into the lungs every 4 (four) hours as needed for wheezing or shortness of breath.   diphenhydramine-acetaminophen 25-500 MG Tabs tablet Commonly known as: TYLENOL PM Take 1 tablet by mouth at bedtime.   docusate sodium 100 MG capsule Commonly known as: Colace Take 1 capsule (100 mg total) by mouth daily as needed for moderate constipation or mild constipation.   pantoprazole 40 MG tablet Commonly known as: PROTONIX TAKE 1 TABLET  BY MOUTH TWICE A DAY What changed: when to take this   Symbicort 160-4.5 MCG/ACT inhaler Generic drug: budesonide-formoterol Inhale 1 puff into the lungs 2 (two) times daily.   tamsulosin 0.4 MG Caps capsule Commonly known as: FLOMAX Take 0.4 mg by mouth 2 (two) times daily.   traMADol 50 MG tablet Commonly known as: ULTRAM Take 50 mg by mouth every 6 (six) hours as needed for moderate pain.   Vitamin D3 125 MCG (5000 UT)  Tabs Take 5,000 Units by mouth daily.       Subjective Today, patient was seen and examined at bedside.  Has had bowel movement this morning without any blood.  Seen by GI.  At this time, patient is stable.  He wishes to go home.  Patient's son at bedside.  Discharge Exam: Filed Weights   02/14/22 1816  Weight: 69.4 kg   Vitals with BMI 02/15/2022 02/15/2022 02/15/2022  Height - - -  Weight - - -  BMI - - -  Systolic 333 545 625  Diastolic 63 85 63  Pulse 91 89 86    General:  Average built, not in obvious distress elderly male, hard of hearing HENT:   Mild pallor noted. Oral mucosa is moist.  Chest:  Clear breath sounds.  Diminished breath sounds bilaterally. No crackles or wheezes.  CVS: S1 &S2 heard. No murmur.  Regular rate and rhythm. Abdomen: Soft, nontender, nondistended.  Bowel sounds are heard.   Extremities: No cyanosis, clubbing or edema.  Peripheral pulses are palpable. Psych: Alert, awake and oriented, normal mood, hard of hearing, CNS:  No cranial nerve deficits.  Power equal in all extremities.   Skin: Warm and dry.  No rashes noted.   Condition at discharge: good  The results of significant diagnostics from this hospitalization (including imaging, microbiology, ancillary and laboratory) are listed below for reference.   Imaging Studies: DG Ribs Unilateral Right  Result Date: 02/14/2022 CLINICAL DATA:  Right-sided ribs EXAM: LEFT RIBS AND CHEST - 3+ VIEW; RIGHT RIBS - 2 VIEW COMPARISON:  Chest radiograph 08/30/2021, included lung bases from abdominal CT earlier today. FINDINGS: Right ribs: No right rib fracture. Sclerotic metastatic disease of multiple ribs on CT not well demonstrated by radiograph. Left ribs: Fractures of posterior left 6, seventh, eighth, and ninth ribs are chronic with surrounding callus formation. There is no acute rib fracture. Sclerotic metastatic disease of multiple ribs on CT not well demonstrated by radiograph. Chest: The heart is normal in  size. Stable mediastinal contours with aortic tortuosity. No significant pleural effusion. No pneumothorax. Sclerotic osseous metastatic disease involving the right proximal humerus and distal right clavicle. Advanced bilateral shoulder arthropathy. IMPRESSION: 1. No acute rib fracture. Chronic left rib fractures with surrounding callus formation. 2. Known sclerotic rib lesions/metastasis are not well demonstrated by radiograph. Electronically Signed   By: Keith Rake M.D.   On: 02/14/2022 23:48   DG Ribs Unilateral W/Chest Left  Result Date: 02/14/2022 CLINICAL DATA:  Right-sided ribs EXAM: LEFT RIBS AND CHEST - 3+ VIEW; RIGHT RIBS - 2 VIEW COMPARISON:  Chest radiograph 08/30/2021, included lung bases from abdominal CT earlier today. FINDINGS: Right ribs: No right rib fracture. Sclerotic metastatic disease of multiple ribs on CT not well demonstrated by radiograph. Left ribs: Fractures of posterior left 6, seventh, eighth, and ninth ribs are chronic with surrounding callus formation. There is no acute rib fracture. Sclerotic metastatic disease of multiple ribs on CT not well demonstrated by radiograph. Chest: The heart is  normal in size. Stable mediastinal contours with aortic tortuosity. No significant pleural effusion. No pneumothorax. Sclerotic osseous metastatic disease involving the right proximal humerus and distal right clavicle. Advanced bilateral shoulder arthropathy. IMPRESSION: 1. No acute rib fracture. Chronic left rib fractures with surrounding callus formation. 2. Known sclerotic rib lesions/metastasis are not well demonstrated by radiograph. Electronically Signed   By: Keith Rake M.D.   On: 02/14/2022 23:48   CT ABDOMEN PELVIS W CONTRAST  Result Date: 02/14/2022 CLINICAL DATA:  RIGHT side and lower quadrant pain, severe abdominal pain, rectal bleeding today, fatigue; history of prostate cancer per prior imaging exams EXAM: CT ABDOMEN AND PELVIS WITH CONTRAST TECHNIQUE: Multidetector  CT imaging of the abdomen and pelvis was performed using the standard protocol following bolus administration of intravenous contrast. RADIATION DOSE REDUCTION: This exam was performed according to the departmental dose-optimization program which includes automated exposure control, adjustment of the mA and/or kV according to patient size and/or use of iterative reconstruction technique. CONTRAST:  89m OMNIPAQUE IOHEXOL 350 MG/ML SOLN IV. No oral contrast. COMPARISON:  03/11/2021 FINDINGS: Lower chest: Lung bases emphysematous but clear Hepatobiliary: Gallbladder and liver normal appearance Pancreas: Normal appearance Spleen: Normal appearance Adrenals/Urinary Tract: Adrenal glands, kidneys, ureters, and bladder normal appearance Stomach/Bowel: Normal appendix. Prominent stool throughout colon. Sigmoid diverticulosis without evidence of diverticulitis. Vascular/Lymphatic: Atherosclerotic calcifications aorta and iliac arteries. Aneurysmal dilatation mid abdominal aorta 3.6 x 3.6 cm image 42, previously 3.3 cm, fusiform, currently approximately 3.3 cm length. Common iliac arteries prominent in size bilaterally 16 mm RIGHT and 14 mm LEFT. No adenopathy. Reproductive: Mildly enlarged prostate gland with clips. Other: Small BILATERAL inguinal hernias containing fat. No free air or free fluid. Musculoskeletal: Diffuse sclerotic osseous metastases progressive since previous exam especially in RIGHT pelvis. Degenerative disc disease changes thoracolumbar spine with dextroconvex scoliosis. IMPRESSION: Sigmoid diverticulosis without evidence of diverticulitis. Prominent stool throughout colon. Small BILATERAL inguinal hernias containing fat. Diffuse sclerotic osseous metastases progressive since previous exam especially in RIGHT pelvis. Fusiform 3.6 x 3.6 cm abdominal aortic aneurysm, 3.3 cm length Recommend follow-up ultrasound every 2 years. This recommendation follows ACR consensus guidelines: White Paper of the ACR  Incidental Findings Committee II on Vascular Findings. J Am Coll Radiol 2013; 10:789-794. Aortic Atherosclerosis (ICD10-I70.0). Emphysema (ICD10-J43.9). Aortic aneurysm NOS (ICD10-I71.9). Electronically Signed   By: MLavonia DanaM.D.   On: 02/14/2022 11:38   CT Angio Abd/Pel w/ and/or w/o  Result Date: 02/14/2022 CLINICAL DATA:  88year old with lower GI bleed. EXAM: CTA ABDOMEN AND PELVIS WITHOUT AND WITH CONTRAST TECHNIQUE: Multidetector CT imaging of the abdomen and pelvis was performed using the standard protocol during bolus administration of intravenous contrast. Multiplanar reconstructed images and MIPs were obtained and reviewed to evaluate the vascular anatomy. RADIATION DOSE REDUCTION: This exam was performed according to the departmental dose-optimization program which includes automated exposure control, adjustment of the mA and/or kV according to patient size and/or use of iterative reconstruction technique. CONTRAST:  736mOMNIPAQUE IOHEXOL 350 MG/ML SOLN COMPARISON:  Contrast-enhanced abdominopelvic CT earlier today, CT 12/15/2019 FINDINGS: VASCULAR Aorta: Infrarenal aortic aneurysm measuring 3.6 cm. Aorta is moderately calcified. There is calcified as well as irregular noncalcified atheromatous plaque. Irregular plaque in the infrarenal aorta including the aneurysmal segment. Short-segment chronic dissection, series 10, image 70, present on 2021 exam, although not as well visualized on that exam due to phase of contrast. Evidence of vasculitis or inflammatory change. No significant stenosis. Celiac: Patent without evidence of aneurysm, dissection, vasculitis or significant stenosis. SMA: Patent  without evidence of aneurysm, dissection, vasculitis or significant stenosis. Renals: Both renal arteries are patent without evidence of aneurysm, dissection, vasculitis, fibromuscular dysplasia or significant stenosis. IMA: Arises from the aneurysmal aorta. Patent without evidence of aneurysm, dissection,  vasculitis or significant stenosis. 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 A chronic short segment dissection proximally. 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 Moderate atheromatous plaque without acute findings. No severe stenosis. Proximal Outflow: Bilateral common femoral and visualized portions of the superficial and profunda femoral arteries are patent without evidence of aneurysm, dissection, vasculitis or significant stenosis. Veins: Venous phase imaging demonstrates patency of the portal, splenic, and mesenteric veins. No abnormality of the IVC or iliac veins. Review of the MIP images confirms the above findings. NON-VASCULAR Lower chest: Stable from prior today, no acute findings. Hepatobiliary: Punctate hepatic granuloma. No focal liver lesion. Mild gallbladder distention without calcified gallstone or pericholecystic inflammation. No biliary dilatation. Pancreas: No ductal dilatation or inflammation. Spleen: Normal in size without focal abnormality. Adrenals/Urinary Tract: Normal adrenal glands. There is excreted IV contrast in the renal collecting systems from CT earlier today. No hydronephrosis. Tiny bilateral cortical hypodensities are too small to characterize but likely small cysts. Excreted IV contrast in the urinary bladder which is trabeculated. Small bladder diverticulum at the dome. Stomach/Bowel: There is no accumulation of contrast within the GI tract to localize site of GI bleed. Some high-density within a proximal sigmoid colonic diverticula is present on precontrast exam and represents ingested material prominent colonic diverticulosis without diverticulitis. Moderate colonic stool burden. The appendix is not well seen on the current exam, but was normal earlier today. Unremarkable small bowel. Decompressed stomach Lymphatic: Calcified central mesenteric density may represent calcified lymph nodes present on exams dating back to 2021, possibly sequela of  prior granulomatous disease. There are no enlarged lymph nodes in the abdomen or pelvis. Reproductive: Large prostate gland with coarse calcifications and clips. Probable TURP defect. Other: Small fat containing bilateral inguinal hernias. No ascites. No free intra-abdominal air. Musculoskeletal: Diffuse osseous sclerotic metastatic disease. No change in osseous structures from earlier today. IMPRESSION: 1. No accumulation of contrast within the GI tract to localize site of GI bleed. 2. Colonic diverticulosis without acute inflammation. 3. Infrarenal abdominal aortic aneurysm measuring 3.6 cm. There is a chronic short segment dissection just above the aneurysmal segment. 4. Trabeculated urinary bladder with small bladder diverticulum likely related to chronic bladder outlet obstruction. 5. Additional chronic findings are stable from earlier today. Aortic Atherosclerosis (ICD10-I70.0). Electronically Signed   By: Keith Rake M.D.   On: 02/14/2022 17:17    Microbiology: Results for orders placed or performed during the hospital encounter of 02/14/22  Resp Panel by RT-PCR (Flu A&B, Covid) Nasopharyngeal Swab     Status: None   Collection Time: 02/14/22  9:34 PM   Specimen: Nasopharyngeal Swab; Nasopharyngeal(NP) swabs in vial transport medium  Result Value Ref Range Status   SARS Coronavirus 2 by RT PCR NEGATIVE NEGATIVE Final    Comment: (NOTE) SARS-CoV-2 target nucleic acids are NOT DETECTED.  The SARS-CoV-2 RNA is generally detectable in upper respiratory specimens during the acute phase of infection. The lowest concentration of SARS-CoV-2 viral copies this assay can detect is 138 copies/mL. A negative result does not preclude SARS-Cov-2 infection and should not be used as the sole basis for treatment or other patient management decisions. A negative result may occur with  improper specimen collection/handling, submission of specimen other than nasopharyngeal swab, presence of  viral  mutation(s) within the areas targeted by this assay, and inadequate number of viral copies(<138 copies/mL). A negative result must be combined with clinical observations, patient history, and epidemiological information. The expected result is Negative.  Fact Sheet for Patients:  EntrepreneurPulse.com.au  Fact Sheet for Healthcare Providers:  IncredibleEmployment.be  This test is no t yet approved or cleared by the [place] FDA and  has been authorized for detection and/or diagnosis of SARS-CoV-2 by FDA under an Emergency Use Authorization (EUA). This EUA will remain  in effect (meaning this test can be used) for the duration of the COVID-19 declaration under Section 564(b)(1) of the Act, 21 U.S.C.section 360bbb-3(b)(1), unless the authorization is terminated  or revoked sooner.       Influenza A by PCR NEGATIVE NEGATIVE Final   Influenza B by PCR NEGATIVE NEGATIVE Final    Comment: (NOTE) The Xpert Xpress SARS-CoV-2/FLU/RSV plus assay is intended as an aid in the diagnosis of influenza from Nasopharyngeal swab specimens and should not be used as a sole basis for treatment. Nasal washings and aspirates are unacceptable for Xpert Xpress SARS-CoV-2/FLU/RSV testing.  Fact Sheet for Patients: EntrepreneurPulse.com.au  Fact Sheet for Healthcare Providers: IncredibleEmployment.be  This test is not yet approved or cleared by the [place] FDA and has been authorized for detection and/or diagnosis of SARS-CoV-2 by FDA under an Emergency Use Authorization (EUA). This EUA will remain in effect (meaning this test can be used) for the duration of the COVID-19 declaration under Section 564(b)(1) of the Act, 21 U.S.C. section 360bbb-3(b)(1), unless the authorization is terminated or revoked.  Performed at [facility], [place] [address] [place],  [address]     Labs: CBC: Recent Labs  Lab  02/14/22 0950 02/14/22 1405 02/14/22 1804 02/14/22 2205 02/15/22 0144  WBC 8.9  --  8.1 7.5 7.1  HGB 13.1 12.8* 12.1* 10.8* 10.9*  HCT 39.7  --  37.0* 32.5* 32.3*  MCV 101.0*  --  100.5* 100.3* 100.6*  PLT 201  --  181 178 673   Basic Metabolic Panel: Recent Labs  Lab 02/14/22 0950  NA 138  K 3.9  CL 102  CO2 28  GLUCOSE 105*  BUN 20  CREATININE 0.75  CALCIUM 9.3   Liver Function Tests: Recent Labs  Lab 02/14/22 0950  AST 29  ALT 11  ALKPHOS 755*  BILITOT 0.6  PROT 6.7  ALBUMIN 3.2*   CBG: Recent Labs  Lab 02/15/22 0014 02/15/22 0800  GLUCAP 111* 101*    Discharge time spent: greater than 30 minutes.  Signed: Flora Lipps, MD [facility] 02/15/2022

## 2022-02-15 NOTE — Assessment & Plan Note (Addendum)
Prostate cancer with metastasis.  Patient was treated for prostate cancer in December with chemotherapy.  CT scan with mention of metastasis to the pelvis.  Recommend follow-up with oncology as outpatient. ?

## 2022-02-15 NOTE — Progress Notes (Signed)
Progress Note   Subjective  Chief Complaint: Hematochezia  Today, patient is hard to get history from because he is so hard of hearing and his daughter is not with him.  He tells me that he just had a bowel movement prior to me coming in the room and there was no more blood.  He denies any new complaints or concerns but tells me he is very hungry and would like to eat.  He does not want a procedure.  (I discussed with nurse tech but unfortunately he flushed the toilet before she was able to see)   Objective   Vital signs in last 24 hours: Temp:  [97.9 F (36.6 C)-98.4 F (36.9 C)] 98.2 F (36.8 C) (03/10 0801) Pulse Rate:  [64-91] 91 (03/10 0801) Resp:  [16-26] 16 (03/10 0801) BP: (123-149)/(63-90) 123/63 (03/10 0801) SpO2:  [91 %-98 %] 91 % (03/10 0801) Weight:  [69.4 kg] 69.4 kg (03/09 1816) Last BM Date : 02/14/22 General:    Elderly white male in NAD +very HOH Heart:  Regular rate and rhythm; no murmurs Lungs: Respirations even and unlabored, lungs CTA bilaterally Abdomen:  Soft, nontender and nondistended. Normal bowel sounds. Extremities:  Without edema. Neurologic:  Alert and oriented Psych:  Cooperative. Normal mood and affect.  Intake/Output from previous day: 03/09 0701 - 03/10 0700 In: 240 [P.O.:240] Out: 400 [Urine:400] Intake/Output this shift: Total I/O In: -  Out: 150 [Urine:150]  Lab Results: Recent Labs    02/14/22 1804 02/14/22 2205 02/15/22 0144  WBC 8.1 7.5 7.1  HGB 12.1* 10.8* 10.9*  HCT 37.0* 32.5* 32.3*  PLT 181 178 165   BMET Recent Labs    02/14/22 0950  NA 138  K 3.9  CL 102  CO2 28  GLUCOSE 105*  BUN 20  CREATININE 0.75  CALCIUM 9.3   LFT Recent Labs    02/14/22 0950  PROT 6.7  ALBUMIN 3.2*  AST 29  ALT 11  ALKPHOS 755*  BILITOT 0.6   PT/INR Recent Labs    02/14/22 0950  LABPROT 13.6  INR 1.0    Studies/Results: DG Ribs Unilateral Right  Result Date: 02/14/2022 CLINICAL DATA:  Right-sided ribs EXAM: LEFT  RIBS AND CHEST - 3+ VIEW; RIGHT RIBS - 2 VIEW COMPARISON:  Chest radiograph 08/30/2021, included lung bases from abdominal CT earlier today. FINDINGS: Right ribs: No right rib fracture. Sclerotic metastatic disease of multiple ribs on CT not well demonstrated by radiograph. Left ribs: Fractures of posterior left 6, seventh, eighth, and ninth ribs are chronic with surrounding callus formation. There is no acute rib fracture. Sclerotic metastatic disease of multiple ribs on CT not well demonstrated by radiograph. Chest: The heart is normal in size. Stable mediastinal contours with aortic tortuosity. No significant pleural effusion. No pneumothorax. Sclerotic osseous metastatic disease involving the right proximal humerus and distal right clavicle. Advanced bilateral shoulder arthropathy. IMPRESSION: 1. No acute rib fracture. Chronic left rib fractures with surrounding callus formation. 2. Known sclerotic rib lesions/metastasis are not well demonstrated by radiograph. Electronically Signed   By: Narda Rutherford M.D.   On: 02/14/2022 23:48   DG Ribs Unilateral W/Chest Left  Result Date: 02/14/2022 CLINICAL DATA:  Right-sided ribs EXAM: LEFT RIBS AND CHEST - 3+ VIEW; RIGHT RIBS - 2 VIEW COMPARISON:  Chest radiograph 08/30/2021, included lung bases from abdominal CT earlier today. FINDINGS: Right ribs: No right rib fracture. Sclerotic metastatic disease of multiple ribs on CT not well demonstrated by radiograph. Left ribs:  Fractures of posterior left 6, seventh, eighth, and ninth ribs are chronic with surrounding callus formation. There is no acute rib fracture. Sclerotic metastatic disease of multiple ribs on CT not well demonstrated by radiograph. Chest: The heart is normal in size. Stable mediastinal contours with aortic tortuosity. No significant pleural effusion. No pneumothorax. Sclerotic osseous metastatic disease involving the right proximal humerus and distal right clavicle. Advanced bilateral shoulder  arthropathy. IMPRESSION: 1. No acute rib fracture. Chronic left rib fractures with surrounding callus formation. 2. Known sclerotic rib lesions/metastasis are not well demonstrated by radiograph. Electronically Signed   By: Keith Rake M.D.   On: 02/14/2022 23:48   CT ABDOMEN PELVIS W CONTRAST  Result Date: 02/14/2022 CLINICAL DATA:  RIGHT side and lower quadrant pain, severe abdominal pain, rectal bleeding today, fatigue; history of prostate cancer per prior imaging exams EXAM: CT ABDOMEN AND PELVIS WITH CONTRAST TECHNIQUE: Multidetector CT imaging of the abdomen and pelvis was performed using the standard protocol following bolus administration of intravenous contrast. RADIATION DOSE REDUCTION: This exam was performed according to the departmental dose-optimization program which includes automated exposure control, adjustment of the mA and/or kV according to patient size and/or use of iterative reconstruction technique. CONTRAST:  68mL OMNIPAQUE IOHEXOL 350 MG/ML SOLN IV. No oral contrast. COMPARISON:  03/11/2021 FINDINGS: Lower chest: Lung bases emphysematous but clear Hepatobiliary: Gallbladder and liver normal appearance Pancreas: Normal appearance Spleen: Normal appearance Adrenals/Urinary Tract: Adrenal glands, kidneys, ureters, and bladder normal appearance Stomach/Bowel: Normal appendix. Prominent stool throughout colon. Sigmoid diverticulosis without evidence of diverticulitis. Vascular/Lymphatic: Atherosclerotic calcifications aorta and iliac arteries. Aneurysmal dilatation mid abdominal aorta 3.6 x 3.6 cm image 42, previously 3.3 cm, fusiform, currently approximately 3.3 cm length. Common iliac arteries prominent in size bilaterally 16 mm RIGHT and 14 mm LEFT. No adenopathy. Reproductive: Mildly enlarged prostate gland with clips. Other: Small BILATERAL inguinal hernias containing fat. No free air or free fluid. Musculoskeletal: Diffuse sclerotic osseous metastases progressive since previous exam  especially in RIGHT pelvis. Degenerative disc disease changes thoracolumbar spine with dextroconvex scoliosis. IMPRESSION: Sigmoid diverticulosis without evidence of diverticulitis. Prominent stool throughout colon. Small BILATERAL inguinal hernias containing fat. Diffuse sclerotic osseous metastases progressive since previous exam especially in RIGHT pelvis. Fusiform 3.6 x 3.6 cm abdominal aortic aneurysm, 3.3 cm length Recommend follow-up ultrasound every 2 years. This recommendation follows ACR consensus guidelines: White Paper of the ACR Incidental Findings Committee II on Vascular Findings. J Am Coll Radiol 2013; 10:789-794. Aortic Atherosclerosis (ICD10-I70.0). Emphysema (ICD10-J43.9). Aortic aneurysm NOS (ICD10-I71.9). Electronically Signed   By: Lavonia Dana M.D.   On: 02/14/2022 11:38   CT Angio Abd/Pel w/ and/or w/o  Result Date: 02/14/2022 CLINICAL DATA:  88 year old with lower GI bleed. EXAM: CTA ABDOMEN AND PELVIS WITHOUT AND WITH CONTRAST TECHNIQUE: Multidetector CT imaging of the abdomen and pelvis was performed using the standard protocol during bolus administration of intravenous contrast. Multiplanar reconstructed images and MIPs were obtained and reviewed to evaluate the vascular anatomy. RADIATION DOSE REDUCTION: This exam was performed according to the departmental dose-optimization program which includes automated exposure control, adjustment of the mA and/or kV according to patient size and/or use of iterative reconstruction technique. CONTRAST:  3mL OMNIPAQUE IOHEXOL 350 MG/ML SOLN COMPARISON:  Contrast-enhanced abdominopelvic CT earlier today, CT 12/15/2019 FINDINGS: VASCULAR Aorta: Infrarenal aortic aneurysm measuring 3.6 cm. Aorta is moderately calcified. There is calcified as well as irregular noncalcified atheromatous plaque. Irregular plaque in the infrarenal aorta including the aneurysmal segment. Short-segment chronic dissection, series 10, image 70,  present on 2021 exam, although  not as well visualized on that exam due to phase of contrast. Evidence of vasculitis or inflammatory change. No significant stenosis. Celiac: Patent without evidence of aneurysm, dissection, vasculitis or significant stenosis. SMA: Patent without evidence of aneurysm, dissection, vasculitis or significant stenosis. Renals: Both renal arteries are patent without evidence of aneurysm, dissection, vasculitis, fibromuscular dysplasia or significant stenosis. IMA: Arises from the aneurysmal aorta. Patent without evidence of aneurysm, dissection, vasculitis or significant stenosis. 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 A chronic short segment dissection proximally. 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 Moderate atheromatous plaque without acute findings. No severe stenosis. Proximal Outflow: Bilateral common femoral and visualized portions of the superficial and profunda femoral arteries are patent without evidence of aneurysm, dissection, vasculitis or significant stenosis. Veins: Venous phase imaging demonstrates patency of the portal, splenic, and mesenteric veins. No abnormality of the IVC or iliac veins. Review of the MIP images confirms the above findings. NON-VASCULAR Lower chest: Stable from prior today, no acute findings. Hepatobiliary: Punctate hepatic granuloma. No focal liver lesion. Mild gallbladder distention without calcified gallstone or pericholecystic inflammation. No biliary dilatation. Pancreas: No ductal dilatation or inflammation. Spleen: Normal in size without focal abnormality. Adrenals/Urinary Tract: Normal adrenal glands. There is excreted IV contrast in the renal collecting systems from CT earlier today. No hydronephrosis. Tiny bilateral cortical hypodensities are too small to characterize but likely small cysts. Excreted IV contrast in the urinary bladder which is trabeculated. Small bladder diverticulum at the dome. Stomach/Bowel: There is no accumulation of  contrast within the GI tract to localize site of GI bleed. Some high-density within a proximal sigmoid colonic diverticula is present on precontrast exam and represents ingested material prominent colonic diverticulosis without diverticulitis. Moderate colonic stool burden. The appendix is not well seen on the current exam, but was normal earlier today. Unremarkable small bowel. Decompressed stomach Lymphatic: Calcified central mesenteric density may represent calcified lymph nodes present on exams dating back to 2021, possibly sequela of prior granulomatous disease. There are no enlarged lymph nodes in the abdomen or pelvis. Reproductive: Large prostate gland with coarse calcifications and clips. Probable TURP defect. Other: Small fat containing bilateral inguinal hernias. No ascites. No free intra-abdominal air. Musculoskeletal: Diffuse osseous sclerotic metastatic disease. No change in osseous structures from earlier today. IMPRESSION: 1. No accumulation of contrast within the GI tract to localize site of GI bleed. 2. Colonic diverticulosis without acute inflammation. 3. Infrarenal abdominal aortic aneurysm measuring 3.6 cm. There is a chronic short segment dissection just above the aneurysmal segment. 4. Trabeculated urinary bladder with small bladder diverticulum likely related to chronic bladder outlet obstruction. 5. Additional chronic findings are stable from earlier today. Aortic Atherosclerosis (ICD10-I70.0). Electronically Signed   By: Narda Rutherford M.D.   On: 02/14/2022 17:17     Assessment / Plan:   Assessment: 1.  Hematochezia: Describes dripping bright red blood from rectum and a large amount of bright red blood whenever he went to the bathroom about every hour yesterday, which seemed to stop and slow overnight, he had another bowel movement this morning with no more blood per his report, hemoglobin stable 10.8--> 10.9 overnight, CTA not surprisingly unable to localize site of GI bleed, patient  does not want procedures and wants to eat; again likely diverticular versus possibly radiation proctitis for stercoral ulcer 2.  Prostate cancer: With mets to the bone 3.  Fatty liver 4.  3.3 cm infrarenal AAA 5.  Elevated alk phos: Again likely related to known prostate cancer and mets to the bone  Plan: 1.  Patient was adamant about eating this morning.  Started him on a regular diet.  Would recommend that we keep him at least until later this afternoon to ensure that he is not having ongoing hematochezia.  If he does not have any more bloody bowel movements could consider discharge later today versus tomorrow morning. 2.  Continue to monitor hemoglobin while hospitalized  Thank you for your kind consultation.  We will continue to follow.   LOS: 0 days   Levin Erp  02/15/2022, 9:18 AM

## 2022-02-15 NOTE — Progress Notes (Signed)
Discharge instructions given to patient and patient's son Konrad Dolores.  Both verbalized understanding to instructions.  Encouraged to call the doctor for concerns or questions.  Discharged home. ?

## 2022-02-16 LAB — URINE CULTURE: Culture: 10000 — AB

## 2022-06-24 ENCOUNTER — Encounter (HOSPITAL_COMMUNITY): Payer: Self-pay | Admitting: *Deleted

## 2022-06-24 ENCOUNTER — Emergency Department (HOSPITAL_COMMUNITY): Payer: Medicare Other

## 2022-06-24 ENCOUNTER — Inpatient Hospital Stay (HOSPITAL_COMMUNITY)
Admission: EM | Admit: 2022-06-24 | Discharge: 2022-06-25 | DRG: 947 | Disposition: A | Discharge status: Hospice | Payer: Medicare Other | Attending: Internal Medicine | Admitting: Internal Medicine

## 2022-06-24 ENCOUNTER — Other Ambulatory Visit: Payer: Self-pay

## 2022-06-24 DIAGNOSIS — Z886 Allergy status to analgesic agent status: Secondary | ICD-10-CM

## 2022-06-24 DIAGNOSIS — K219 Gastro-esophageal reflux disease without esophagitis: Secondary | ICD-10-CM

## 2022-06-24 DIAGNOSIS — K5903 Drug induced constipation: Secondary | ICD-10-CM | POA: Diagnosis present

## 2022-06-24 DIAGNOSIS — J439 Emphysema, unspecified: Secondary | ICD-10-CM

## 2022-06-24 DIAGNOSIS — Z8711 Personal history of peptic ulcer disease: Secondary | ICD-10-CM

## 2022-06-24 DIAGNOSIS — Z515 Encounter for palliative care: Secondary | ICD-10-CM

## 2022-06-24 DIAGNOSIS — J431 Panlobular emphysema: Secondary | ICD-10-CM

## 2022-06-24 DIAGNOSIS — R0902 Hypoxemia: Principal | ICD-10-CM

## 2022-06-24 DIAGNOSIS — J9601 Acute respiratory failure with hypoxia: Secondary | ICD-10-CM | POA: Diagnosis present

## 2022-06-24 DIAGNOSIS — Z9079 Acquired absence of other genital organ(s): Secondary | ICD-10-CM

## 2022-06-24 DIAGNOSIS — J432 Centrilobular emphysema: Secondary | ICD-10-CM | POA: Diagnosis present

## 2022-06-24 DIAGNOSIS — G893 Neoplasm related pain (acute) (chronic): Secondary | ICD-10-CM | POA: Diagnosis not present

## 2022-06-24 DIAGNOSIS — Z79899 Other long term (current) drug therapy: Secondary | ICD-10-CM

## 2022-06-24 DIAGNOSIS — Z634 Disappearance and death of family member: Secondary | ICD-10-CM

## 2022-06-24 DIAGNOSIS — Z66 Do not resuscitate: Secondary | ICD-10-CM | POA: Diagnosis present

## 2022-06-24 DIAGNOSIS — H9190 Unspecified hearing loss, unspecified ear: Secondary | ICD-10-CM | POA: Diagnosis present

## 2022-06-24 DIAGNOSIS — C7951 Secondary malignant neoplasm of bone: Secondary | ICD-10-CM

## 2022-06-24 DIAGNOSIS — Z87891 Personal history of nicotine dependence: Secondary | ICD-10-CM

## 2022-06-24 DIAGNOSIS — R339 Retention of urine, unspecified: Secondary | ICD-10-CM | POA: Diagnosis present

## 2022-06-24 DIAGNOSIS — Z8546 Personal history of malignant neoplasm of prostate: Secondary | ICD-10-CM

## 2022-06-24 DIAGNOSIS — T402X5A Adverse effect of other opioids, initial encounter: Secondary | ICD-10-CM | POA: Diagnosis present

## 2022-06-24 DIAGNOSIS — C61 Malignant neoplasm of prostate: Secondary | ICD-10-CM | POA: Diagnosis present

## 2022-06-24 DIAGNOSIS — Z602 Problems related to living alone: Secondary | ICD-10-CM | POA: Diagnosis present

## 2022-06-24 DIAGNOSIS — J9600 Acute respiratory failure, unspecified whether with hypoxia or hypercapnia: Secondary | ICD-10-CM | POA: Diagnosis not present

## 2022-06-24 DIAGNOSIS — Z7951 Long term (current) use of inhaled steroids: Secondary | ICD-10-CM

## 2022-06-24 HISTORY — DX: Emphysema, unspecified: J43.9

## 2022-06-24 HISTORY — DX: Gastro-esophageal reflux disease without esophagitis: K21.9

## 2022-06-24 LAB — COMPREHENSIVE METABOLIC PANEL
ALT: 10 U/L (ref 0–44)
AST: 50 U/L — ABNORMAL HIGH (ref 15–41)
Albumin: 3.3 g/dL — ABNORMAL LOW (ref 3.5–5.0)
Alkaline Phosphatase: 1659 U/L — ABNORMAL HIGH (ref 38–126)
Anion gap: 11 (ref 5–15)
BUN: 20 mg/dL (ref 8–23)
CO2: 28 mmol/L (ref 22–32)
Calcium: 8.9 mg/dL (ref 8.9–10.3)
Chloride: 95 mmol/L — ABNORMAL LOW (ref 98–111)
Creatinine, Ser: 0.59 mg/dL — ABNORMAL LOW (ref 0.61–1.24)
GFR, Estimated: >60 mL/min (ref >60)
Glucose, Bld: 97 mg/dL (ref 70–99)
Potassium: 4.1 mmol/L (ref 3.5–5.1)
Sodium: 134 mmol/L — ABNORMAL LOW (ref 135–145)
Total Bilirubin: 1 mg/dL (ref 0.3–1.2)
Total Protein: 7 g/dL (ref 6.5–8.1)

## 2022-06-24 LAB — URINALYSIS, ROUTINE W REFLEX MICROSCOPIC
Bilirubin Urine: NEGATIVE
Glucose, UA: NEGATIVE mg/dL
Ketones, ur: 20 mg/dL — AB
Leukocytes,Ua: NEGATIVE
Nitrite: NEGATIVE
Protein, ur: NEGATIVE mg/dL
Specific Gravity, Urine: 1.017 (ref 1.005–1.030)
pH: 6 (ref 5.0–8.0)

## 2022-06-24 LAB — CK: Total CK: 161 U/L (ref 49–397)

## 2022-06-24 LAB — CBC WITH DIFFERENTIAL/PLATELET
Abs Immature Granulocytes: 0.06 10*3/uL (ref 0.00–0.07)
Basophils Absolute: 0 10*3/uL (ref 0.0–0.1)
Basophils Relative: 1 %
Eosinophils Absolute: 0.1 10*3/uL (ref 0.0–0.5)
Eosinophils Relative: 1 %
HCT: 39 % (ref 39.0–52.0)
Hemoglobin: 13 g/dL (ref 13.0–17.0)
Immature Granulocytes: 1 %
Lymphocytes Relative: 19 %
Lymphs Abs: 1.5 10*3/uL (ref 0.7–4.0)
MCH: 31.8 pg (ref 26.0–34.0)
MCHC: 33.3 g/dL (ref 30.0–36.0)
MCV: 95.4 fL (ref 80.0–100.0)
Monocytes Absolute: 1.2 10*3/uL — ABNORMAL HIGH (ref 0.1–1.0)
Monocytes Relative: 15 %
Neutro Abs: 5.3 10*3/uL (ref 1.7–7.7)
Neutrophils Relative %: 63 %
Platelets: 169 10*3/uL (ref 150–400)
RBC: 4.09 MIL/uL — ABNORMAL LOW (ref 4.22–5.81)
RDW: 14.2 % (ref 11.5–15.5)
WBC: 8.2 10*3/uL (ref 4.0–10.5)
nRBC: 0 % (ref 0.0–0.2)

## 2022-06-24 MED ORDER — MOMETASONE FURO-FORMOTEROL FUM 200-5 MCG/ACT IN AERO
2.0000 | INHALATION_SPRAY | Freq: Two times a day (BID) | RESPIRATORY_TRACT | Status: DC
  Administered 2022-06-24 – 2022-06-25 (×2): 2 via RESPIRATORY_TRACT
  Filled 2022-06-24: qty 8.8

## 2022-06-24 MED ORDER — SODIUM CHLORIDE 0.45 % IV SOLN: INTRAVENOUS | Status: DC

## 2022-06-24 MED ORDER — FENTANYL CITRATE PF 50 MCG/ML IJ SOSY: 12.5000 ug | PREFILLED_SYRINGE | INTRAMUSCULAR | Status: DC | PRN

## 2022-06-24 MED ORDER — OXYCODONE-ACETAMINOPHEN 5-325 MG PO TABS
1.0000 | ORAL_TABLET | Freq: Once | ORAL | Status: AC
  Administered 2022-06-24: 1 via ORAL
  Filled 2022-06-24: qty 1

## 2022-06-24 MED ORDER — SENNA 8.6 MG PO TABS
1.0000 | ORAL_TABLET | Freq: Two times a day (BID) | ORAL | Status: DC
  Administered 2022-06-24 – 2022-06-25 (×2): 8.6 mg via ORAL
  Filled 2022-06-24 (×2): qty 1

## 2022-06-24 MED ORDER — PANTOPRAZOLE SODIUM 40 MG PO TBEC
40.0000 mg | DELAYED_RELEASE_TABLET | Freq: Every day | ORAL | Status: DC
  Administered 2022-06-24 – 2022-06-25 (×2): 40 mg via ORAL
  Filled 2022-06-24 (×2): qty 1

## 2022-06-24 MED ORDER — FLEET ENEMA 7-19 GM/118ML RE ENEM: 1.0000 | ENEMA | Freq: Once | RECTAL | Status: DC | PRN

## 2022-06-24 MED ORDER — TAMSULOSIN HCL 0.4 MG PO CAPS
0.4000 mg | ORAL_CAPSULE | Freq: Two times a day (BID) | ORAL | Status: DC
Start: 2022-06-24 — End: 2022-06-25
  Administered 2022-06-24 – 2022-06-25 (×2): 0.4 mg via ORAL
  Filled 2022-06-24 (×2): qty 1

## 2022-06-24 MED ORDER — BISACODYL 10 MG RE SUPP: 10.0000 mg | Freq: Every day | RECTAL | Status: DC | PRN

## 2022-06-24 MED ORDER — IOHEXOL 350 MG/ML SOLN
75.0000 mL | Freq: Once | INTRAVENOUS | Status: AC | PRN
  Administered 2022-06-24: 75 mL via INTRAVENOUS

## 2022-06-24 MED ORDER — SODIUM CHLORIDE 0.9 % IV BOLUS
1000.0000 mL | Freq: Once | INTRAVENOUS | Status: AC
  Administered 2022-06-24: 1000 mL via INTRAVENOUS

## 2022-06-24 MED ORDER — METHOCARBAMOL 1000 MG/10ML IJ SOLN: 500.0000 mg | Freq: Four times a day (QID) | INTRAVENOUS | Status: DC | PRN

## 2022-06-24 MED ORDER — HYDROMORPHONE HCL 1 MG/ML IJ SOLN
0.5000 mg | Freq: Once | INTRAMUSCULAR | Status: AC
  Administered 2022-06-24: 0.5 mg via INTRAVENOUS
  Filled 2022-06-24: qty 0.5

## 2022-06-24 MED ORDER — HEPARIN SODIUM (PORCINE) 5000 UNIT/ML IJ SOLN
5000.0000 [IU] | Freq: Three times a day (TID) | INTRAMUSCULAR | Status: DC
  Administered 2022-06-24 – 2022-06-25 (×2): 5000 [IU] via SUBCUTANEOUS
  Filled 2022-06-24 (×3): qty 1

## 2022-06-24 MED ORDER — ALBUTEROL SULFATE (2.5 MG/3ML) 0.083% IN NEBU
3.0000 mL | INHALATION_SOLUTION | RESPIRATORY_TRACT | Status: DC | PRN
Start: 2022-06-24 — End: 2022-06-25

## 2022-06-24 MED ORDER — TRAMADOL HCL 50 MG PO TABS: 50.0000 mg | ORAL_TABLET | Freq: Four times a day (QID) | ORAL | Status: DC | PRN

## 2022-06-24 MED ORDER — IBUPROFEN 400 MG PO TABS
800.0000 mg | ORAL_TABLET | Freq: Three times a day (TID) | ORAL | Status: DC
  Administered 2022-06-24 – 2022-06-25 (×3): 800 mg via ORAL
  Filled 2022-06-24 (×3): qty 2

## 2022-06-24 MED ORDER — POLYETHYLENE GLYCOL 3350 17 G PO PACK
34.0000 g | PACK | Freq: Two times a day (BID) | ORAL | Status: DC
  Administered 2022-06-25: 34 g via ORAL
  Filled 2022-06-24 (×2): qty 2

## 2022-06-24 NOTE — ED Notes (Signed)
Patient transported to X-ray 

## 2022-06-24 NOTE — ED Notes (Signed)
Pt placed on RA by MD

## 2022-06-24 NOTE — ED Provider Notes (Signed)
[facility] EMERGENCY DEPARTMENT Provider Note   CSN: [id number] Arrival date & time: 06/24/22  0846     History  Chief Complaint  Patient presents with   Pain    Donald Ellis is a 88 y.o. male.  Patient has a history of metastatic prostate cancer.  He complains of pain in his right hip and his right shoulder along with constipation and weakness.  Patient also has a history of emphysema  The history is provided by the patient and medical records. A language interpreter was used.  Weakness Severity:  Moderate Onset quality:  Sudden Timing:  Constant Progression:  Worsening Chronicity:  Recurrent Context: not alcohol use   Relieved by:  Nothing Worsened by:  Nothing Ineffective treatments:  None tried Associated symptoms: shortness of breath   Associated symptoms: no abdominal pain, no chest pain, no cough, no diarrhea, no frequency, no headaches and no seizures        Home Medications Prior to Admission medications   Medication Sig Start Date End Date Taking? Authorizing Provider  acetaminophen (TYLENOL) 650 MG CR tablet Take 650 mg by mouth every 8 (eight) hours as needed for pain.   Yes [provider]  albuterol (VENTOLIN HFA) 108 (90 Base) MCG/ACT inhaler Inhale 2 puffs into the lungs every 4 (four) hours as needed for wheezing or shortness of breath. 02/26/21  Yes [provider]  Cholecalciferol (VITAMIN D3) 125 MCG (5000 UT) TABS Take 5,000 Units by mouth daily.   Yes [provider]  diphenhydramine-acetaminophen (TYLENOL PM) 25-500 MG TABS tablet Take 1 tablet by mouth at bedtime.   Yes [provider]  ibuprofen (ADVIL) 800 MG tablet Take 800 mg by mouth 3 (three) times daily. 02/21/22  Yes [provider]  Oxycodone HCl 10 MG TABS Take 10 mg by mouth every 6 (six) hours as needed. 06/20/22  Yes [provider]  pantoprazole (PROTONIX) 40 MG tablet TAKE 1 TABLET BY MOUTH TWICE A DAY Patient taking differently:  Take 40 mg by mouth daily. 07/23/21  Yes Danis, Kirke Corin, MD  SYMBICORT 160-4.5 MCG/ACT inhaler Inhale 1 puff into the lungs 2 (two) times daily. 01/24/21  Yes [provider]  tamsulosin (FLOMAX) 0.4 MG CAPS capsule Take 0.4 mg by mouth 2 (two) times daily. 09/21/20  Yes [provider]  traMADol (ULTRAM) 50 MG tablet Take 50 mg by mouth every 6 (six) hours as needed for moderate pain. 08/21/21  Yes [provider]      Allergies    Hydrocodone-acetaminophen    Review of Systems   Review of Systems  Constitutional:  Negative for appetite change and fatigue.  HENT:  Negative for congestion, ear discharge and sinus pressure.   Eyes:  Negative for discharge.  Respiratory:  Positive for shortness of breath. Negative for cough.   Cardiovascular:  Negative for chest pain.  Gastrointestinal:  Negative for abdominal pain and diarrhea.  Genitourinary:  Negative for frequency and hematuria.  Musculoskeletal:  Negative for back pain.       Pain right hip and right shoulder  Skin:  Negative for rash.  Neurological:  Positive for weakness. Negative for seizures and headaches.  Psychiatric/Behavioral:  Negative for hallucinations.     Physical Exam Updated Vital Signs BP 135/66   Pulse 96   Temp 98.7 F (37.1 C) (Oral)   Resp 20   Ht '5\' 9"'$  (1.753 m)   Wt 59 kg   SpO2 99%   BMI 19.20 kg/m  Physical Exam Vitals and nursing note reviewed.  Constitutional:      Appearance: He is well-developed.  HENT:     Head: Normocephalic.     Nose: Nose normal.  Eyes:     General: No scleral icterus.    Conjunctiva/sclera: Conjunctivae normal.  Neck:     Thyroid: No thyromegaly.  Cardiovascular:     Rate and Rhythm: Normal rate and regular rhythm.     Heart sounds: No murmur heard.    No friction rub. No gallop.  Pulmonary:     Breath sounds: No stridor. No wheezing or rales.  Chest:     Chest wall: No tenderness.  Abdominal:     General: There is no distension.      Tenderness: There is no abdominal tenderness. There is no rebound.  Musculoskeletal:     Cervical back: Neck supple.     Comments: Tender right hip and shoulder  Lymphadenopathy:     Cervical: No cervical adenopathy.  Skin:    Findings: No erythema or rash.  Neurological:     Mental Status: He is alert and oriented to person, place, and time.     Motor: No abnormal muscle tone.     Coordination: Coordination normal.  Psychiatric:        Behavior: Behavior normal.     ED Results / Procedures / Treatments   Labs (all labs ordered are listed, but only abnormal results are displayed) Labs Reviewed  CBC WITH DIFFERENTIAL/PLATELET - Abnormal; Notable for the following components:      Result Value   RBC 4.09 (*)    Monocytes Absolute 1.2 (*)    All other components within normal limits  COMPREHENSIVE METABOLIC PANEL - Abnormal; Notable for the following components:   Sodium 134 (*)    Chloride 95 (*)    Creatinine, Ser 0.59 (*)    Albumin 3.3 (*)    AST 50 (*)    Alkaline Phosphatase 1,659 (*)    All other components within normal limits  URINALYSIS, ROUTINE W REFLEX MICROSCOPIC - Abnormal; Notable for the following components:   Hgb urine dipstick MODERATE (*)    Ketones, ur 20 (*)    Bacteria, UA RARE (*)    All other components within normal limits  CK    EKG None  Radiology CT Angio Chest PE W and/or Wo Contrast  Result Date: 06/24/2022 CLINICAL DATA:  Prostate cancer with widespread osseous metastatic disease. Chest pain. * Tracking Code: BO * EXAM: CT ANGIOGRAPHY CHEST WITH CONTRAST TECHNIQUE: Multidetector CT imaging of the chest was performed using the standard protocol during bolus administration of intravenous contrast. Multiplanar CT image reconstructions and MIPs were obtained to evaluate the vascular anatomy. RADIATION DOSE REDUCTION: This exam was performed according to the departmental dose-optimization program which includes automated exposure control,  adjustment of the mA and/or kV according to patient size and/or use of iterative reconstruction technique. CONTRAST:  43m OMNIPAQUE IOHEXOL 350 MG/ML SOLN COMPARISON:  Overlapping portions CT abdomen 02/14/2022 FINDINGS: Cardiovascular: Coronary, aortic arch, and branch vessel atherosclerotic vascular disease. No filling defect is identified in the pulmonary arterial tree to suggest pulmonary embolus. Mediastinum/Nodes: Right axillary node 1.3 cm in short axis on image 28 series 4. Lungs/Pleura: Trace right pleural effusion. Severe centrilobular emphysema. There is mucous in the trachea. Upper Abdomen: Unremarkable Musculoskeletal: Extensive and widespread sclerotic osseous metastatic disease in the skeleton. Notable callus formation along the left ninth rib laterally. Old posterior fractures of the left seventh, eighth,  and ninth ribs. Review of the MIP images confirms the above findings. IMPRESSION: 1. No filling defect is identified in the pulmonary arterial tree to suggest pulmonary embolus. 2. Extensive and widespread sclerotic osseous metastatic disease. Notable callus formation along the left ninth rib laterally potentially from a late phase healing fracture. 3. Trace right pleural effusion. 4. Severe centrilobular emphysema. Aortic Atherosclerosis (ICD10-I70.0) and Emphysema (ICD10-J43.9). Coronary atherosclerosis. Electronically Signed   By: Van Clines M.D.   On: 06/24/2022 15:06   DG Abdomen 1 View  Result Date: 06/24/2022 CLINICAL DATA:  Constipation EXAM: ABDOMEN - 1 VIEW COMPARISON:  None Available. FINDINGS: Nonobstructive pattern of bowel gas. Moderate burden of stool and stool balls primarily present in the left colon and rectum. No free air in the abdomen on supine radiographs. Disc degenerative disease of the lumbar spine. IMPRESSION: Nonobstructive pattern of bowel gas. Moderate burden of stool and stool balls primarily present in the left colon and rectum. No free air in the abdomen  on supine radiographs. Electronically Signed   By: Delanna Ahmadi M.D.   On: 06/24/2022 13:06   DG Knee Complete 4 Views Right  Result Date: 06/24/2022 CLINICAL DATA:  Trauma, fall, pain EXAM: RIGHT KNEE - COMPLETE 4+ VIEW COMPARISON:  None Available. FINDINGS: No recent fracture or dislocation is seen. Degenerative changes are noted with bony spurs in medial, lateral and patellofemoral compartments. Chondrocalcinosis is seen. There are smoothly marginated calcifications along the medial and lateral aspects of the right knee, possibly residual change from previous injury. There is no significant effusion in suprapatellar bursa. There are linear calcifications in the distal portion of quadriceps tendon. There are small metallic densities each measuring less than 3 mm in the soft tissues anteromedial to the distal femur suggesting possible foreign body in the soft tissues. IMPRESSION: No recent fracture or dislocation is seen. Marked degenerative changes are noted. There are 2 small metallic densities in the soft tissues along the anteromedial aspect of the right knee suggesting foreign bodies. Electronically Signed   By: Elmer Picker M.D.   On: 06/24/2022 12:33   DG Chest Port 1 View  Result Date: 06/24/2022 CLINICAL DATA:  Hypoxia. Shortness of breath. Metastatic prostate cancer. EXAM: PORTABLE CHEST 1 VIEW COMPARISON:  Radiographs 02/14/2022 and 08/30/2021. FINDINGS: The heart size and mediastinal contours are stable with aortic atherosclerosis. The lungs appear mildly hyperinflated but clear. There is no pleural effusion or pneumothorax. Sclerotic osseous metastatic disease is noted, especially within the proximal right humerus. There is old fracture of the mid left clavicle. There are degenerative changes in the spine and mild scoliosis. No acute osseous findings are evident. Telemetry leads overlie the chest. IMPRESSION: 1. No evidence of acute cardiopulmonary process. 2. Known osseous metastatic  disease. Electronically Signed   By: Richardean Sale M.D.   On: 06/24/2022 11:59   DG Shoulder Right  Result Date: 06/24/2022 CLINICAL DATA:  Pain.  History of bone cancer. EXAM: RIGHT SHOULDER - 2+ VIEW COMPARISON:  None Available. FINDINGS: There is no evidence of fracture or dislocation. Severe degenerative changes seen involving the right acromioclavicular and glenohumeral joints. Ill-defined sclerotic densities are seen involving the visualized proximal right humerus concerning for metastatic disease. Soft tissues are unremarkable. IMPRESSION: No fracture or dislocation is noted. Severe degenerative changes are noted as described above. Ill-defined sclerotic densities are noted involving proximal right humerus concerning for metastatic disease. Electronically Signed   By: Marijo Conception M.D.   On: 06/24/2022 10:21   DG Hip Unilat  W or Wo Pelvis 2-3 Views Right  Result Date: 06/24/2022 CLINICAL DATA:  Pain.  History of bone cancer. EXAM: DG HIP (WITH OR WITHOUT PELVIS) 2-3V RIGHT COMPARISON:  February 14, 2022.  August 30, 2021. FINDINGS: There is no evidence of hip fracture or dislocation. Sclerotic density is seen involving the right acetabulum and ischial tuberosity consistent with metastatic disease. Multiple other sclerotic densities are noted concerning for metastatic disease. IMPRESSION: Findings consistent with osseous metastases, particularly in the right acetabular region. No fracture or dislocation is noted. Electronically Signed   By: Marijo Conception M.D.   On: 06/24/2022 10:19    Procedures Procedures    Medications Ordered in ED Medications  oxyCODONE-acetaminophen (PERCOCET/ROXICET) 5-325 MG per tablet 1 tablet (has no administration in time range)  sodium chloride 0.9 % bolus 1,000 mL (0 mLs Intravenous Stopped 06/24/22 1031)  HYDROmorphone (DILAUDID) injection 0.5 mg (0.5 mg Intravenous Given 06/24/22 0933)  iohexol (OMNIPAQUE) 350 MG/ML injection 75 mL (75 mLs Intravenous  Contrast Given 06/24/22 1450)    ED Course/ Medical Decision Making/ A&P  ,edcr CRITICAL CARE Performed by: Milton Ferguson Total critical care time: 40 minutes Critical care time was exclusive of separately billable procedures and treating other patients. Critical care was necessary to treat or prevent imminent or life-threatening deterioration. Critical care was time spent personally by me on the following activities: development of treatment plan with patient and/or surrogate as well as nursing, discussions with consultants, evaluation of patient's response to treatment, examination of patient, obtaining history from patient or surrogate, ordering and performing treatments and interventions, ordering and review of laboratory studies, ordering and review of radiographic studies, pulse oximetry and re-evaluation of patient's condition.                          Medical Decision Making Amount and/or Complexity of Data Reviewed Labs: ordered. Radiology: ordered. ECG/medicine tests: ordered.  Risk Prescription drug management. Decision regarding hospitalization.  This patient presents to the ED for concern of hip and shoulder pain, this involves an extensive number of treatment options, and is a complaint that carries with it a high risk of complications and morbidity.  The differential diagnosis includes worsening prostate cancer   Co morbidities that complicate the patient evaluation  Prostate cancer and emphysema   Additional history obtained:  Additional history obtained from family External records from outside source obtained and reviewed including hospital record   Lab Tests:  I Ordered, and personally interpreted labs.  The pertinent results include: Alkaline phos extremely elevated 1659   Imaging Studies ordered:  I ordered imaging studies including CT chest, right hip and right shoulder I independently visualized and interpreted imaging which showed metastatic disease  and emphysema I agree with the radiologist interpretation   Cardiac Monitoring: / EKG:  The patient was maintained on a cardiac monitor.  I personally viewed and interpreted the cardiac monitored which showed an underlying rhythm of: Normal sinus rhythm   Consultations Obtained:  I requested consultation with the hospitalist,  and discussed lab and imaging findings as well as pertinent plan - they recommend: Admit   Problem List / ED Course / Critical interventions / Medication management  Prostate CA and emphysema I ordered medication including Dilaudid for pain Reevaluation of the patient after these medicines showed that the patient stayed the same I have reviewed the patients home medicines and have made adjustments as needed   Social Determinants of Health:  None  Test / Admission - Considered:  No additional test  Patient has worsening pain from his metastatic prostate cancer along with some hypoxia from his emphysema he will be admitted to medicine        Final Clinical Impression(s) / ED Diagnoses Final diagnoses:  None    Rx / DC Orders ED Discharge Orders     None         Milton Ferguson, MD 06/27/22 1100

## 2022-06-24 NOTE — H&P (Addendum)
History and Physical    Patient: Donald Ellis MPN:[id number] DOB: 17-Apr-1934 DOA: 06/24/2022 DOS: the patient was seen and examined on 06/24/2022 PCP: Christain Sacramento, MD  Patient coming from: Home  Chief Complaint:  Chief Complaint  Patient presents with   Pain   HPI: Donald Ellis is a 88 y.o. male with medical history significant of Prostate cancer w/ diffuse metastases, GERD, hearing loss, Emphysema. Pt presenting from home w/ 36hr of gradual onset severe pain. Describes the pain as all over. Pt is also complainign of 7 days w/o BM. Recently placed on Oxycodone and APAP for pain secondary to mets. Bms have been difficult to pass since that time. Senokot w/o benefit. Denies any CP, SOB, palpitaitons, DOE, confusion, dysuria, hematochezia. States that the APAP does a better job of controlling pain than oxycodone. Pain Is diffuse and infolves his joints and muscles. Worse w/ movement.   Review of Systems: As mentioned in the history of present illness. All other systems reviewed and are negative. Past Medical History:  Diagnosis Date   Emphysema lung ([place]) 06/24/2022   Foley catheter in place    LAST CHANGED 07-04-2001   GERD (gastroesophageal reflux disease) 06/24/2022   HOH (hard of hearing)    Urinary retention    Past Surgical History:  Procedure Laterality Date   BIOPSY  03/12/2021   Procedure: BIOPSY;  Surgeon: Doran Stabler, MD;  Location: [place];  Service: Gastroenterology;;   CYSTOSCOPY WITH INSERTION OF [place] N/A 07/01/2019   Procedure: CYSTOSCOPY WITH INSERTION OF UROLIFT;  Surgeon: Cleon Gustin, MD;  Location: [facility];  Service: Urology;  Laterality: N/A;   ESOPHAGOGASTRODUODENOSCOPY (EGD) WITH PROPOFOL N/A 03/12/2021   Procedure: ESOPHAGOGASTRODUODENOSCOPY (EGD) WITH PROPOFOL;  Surgeon: Doran Stabler, MD;  Location: [place];  Service: Gastroenterology;  Laterality: N/A;   HOT HEMOSTASIS N/A 03/12/2021   Procedure: HOT HEMOSTASIS  (ARGON PLASMA COAGULATION/BICAP);  Surgeon: Doran Stabler, MD;  Location: [place];  Service: Gastroenterology;  Laterality: N/A;   SCLEROTHERAPY  03/12/2021   Procedure: SCLEROTHERAPY;  Surgeon: Doran Stabler, MD;  Location: [place];  Service: Gastroenterology;;   TRANSURETHRAL RESECTION OF PROSTATE N/A 08/30/2019   Procedure: TRANSURETHRAL RESECTION OF THE PROSTATE (TURP);  Surgeon: Cleon Gustin, MD;  Location: [facility];  Service: Urology;  Laterality: N/A;  2 HRS   Social History:  reports that he has quit smoking. He has never used smokeless tobacco. He reports that he does not currently use alcohol. He reports that he does not use drugs.  Allergies  Allergen Reactions   Hydrocodone-Acetaminophen Itching    Itching and tingling without a rash    Family History  Problem Relation Age of Onset   GI Bleed Neg Hx     Prior to Admission medications   Medication Sig Start Date End Date Taking? Authorizing Provider  acetaminophen (TYLENOL) 650 MG CR tablet Take 650 mg by mouth every 8 (eight) hours as needed for pain.   Yes [provider]  albuterol (VENTOLIN HFA) 108 (90 Base) MCG/ACT inhaler Inhale 2 puffs into the lungs every 4 (four) hours as needed for wheezing or shortness of breath. 02/26/21  Yes [provider]  Cholecalciferol (VITAMIN D3) 125 MCG (5000 UT) TABS Take 5,000 Units by mouth daily.   Yes [provider]  diphenhydramine-acetaminophen (TYLENOL PM) 25-500 MG TABS tablet Take 1 tablet by mouth at bedtime.   Yes [provider]  ibuprofen (ADVIL)  800 MG tablet Take 800 mg by mouth 3 (three) times daily. 02/21/22  Yes [provider]  Oxycodone HCl 10 MG TABS Take 10 mg by mouth every 6 (six) hours as needed. 06/20/22  Yes [provider]  pantoprazole (PROTONIX) 40 MG tablet TAKE 1 TABLET BY MOUTH TWICE A DAY Patient taking differently: Take 40 mg by mouth daily. 07/23/21  Yes Danis,  Kirke Corin, MD  SYMBICORT 160-4.5 MCG/ACT inhaler Inhale 1 puff into the lungs 2 (two) times daily. 01/24/21  Yes [provider]  tamsulosin (FLOMAX) 0.4 MG CAPS capsule Take 0.4 mg by mouth 2 (two) times daily. 09/21/20  Yes [provider]  traMADol (ULTRAM) 50 MG tablet Take 50 mg by mouth every 6 (six) hours as needed for moderate pain. 08/21/21  Yes [provider]    Physical Exam: Vitals:   06/24/22 1430 06/24/22 1445 06/24/22 1500 06/24/22 1600  BP: (!) 147/76  135/66 (!) 155/76  Pulse: 97 99 96 (!) 103  Resp: 20 (!) 22 20 (!) 22  Temp:      TempSrc:      SpO2: 97% 99% 99% 94%  Weight:      Height:         General:  Elderly. Appears calm and comfortable and stated age Eyes:  PERRL, EOMI, normal lids, iris ENT:  hard of  hearing, lips & tongue, mmm, no teeth Neck:  no LAD, masses or thyromegaly Cardiovascular:  RRR, no m/r/g. No LE edema.  Respiratory:  CTA bilaterally, no w/r/r. Normal respiratory effort. Abdomen:  soft, ntnd, NABS Skin:  no rash or induration seen on limited exam Musculoskeletal:  limited exam due to pain and weakness. grossly normal tone BUE/BLE, No bony abnormality Psychiatric:  grossly normal mood and affect, speech fluent and appropriate, AOx3 Neurologic:  CN 2-12 grossly intact, moves all extremities in coordinated fashion, sensation intact   Data Reviewed: Numerous imaging studies perofrmed w/o evidence of fracture. Diffuse metastatic disesase noted throughout.  ECG w/o ACS other arrhythmia. Sinus.   Assessment and Plan: Acute Respiratory Failure Metastatic Prostate Cancer Emphysema GERD Constipation   Acute Respiratory Failure: O2 saturations in the mid 80s for several hours after 0.'5mg'$  dilaudid. Suspect ARF secondary to severe emphysema which worsened his borderline hypoxia secondary to respiratory depression from dilaudid. No evidence of acute pulmonary infectious process.  - No further Dilaudid.  - Wean O2  as able.  - pt likely to benefit from having Home O2 - consider ordering DME at time of DC.  - CXR in am  - Continue Symbicort and Albuterol  MSK pain - secondary to severe metastatic Prostate cancer. Difficult to control given level of pathology and pts respiratory status. Per pts family he gets more benefit from APAP than home Oxy.  - Continue APAP - Continue home PRN Tramadol - DC Oxycodone - Continue home Ibuprofen (aware of balance needed w/ NSAIDs due to h/o GERD/PUD - currently controlled).  - IV fentanyl PRN  Constipation: Secondary to new opioid use for Metastatic pain.  - Scheduled Senokot and Miralax - Enema PRN  Metastatic Prostate Cancer: Pt seeing oncology regularly. Unable to find records in Epic though family states his oncologist is in the [facility] system in [place].  - continue outpt mgt by Oncology. Pt is currently DNR but needs further goals of care discussions as family endoring the current Rx regimen is not working on pt.  - continue flomax  GERD: at baseline and currently w/o complaints -  continue PPI    Advance Care Planning:   Code Status: Prior DNR  Consults: None  Family Communication: Daughters, Grandson  Severity of Illness: The appropriate patient status for this patient is OBSERVATION. Observation status is judged to be reasonable and necessary in order to provide the required intensity of service to ensure the patient's safety. The patient's presenting symptoms, physical exam findings, and initial radiographic and laboratory data in the context of their medical condition is felt to place them at decreased risk for further clinical deterioration. Furthermore, it is anticipated that the patient will be medically stable for discharge from the hospital within 2 midnights of admission.   Author: Waldemar Dickens, MD 06/24/2022 5:04 PM  For on call review [email or web address].

## 2022-06-24 NOTE — ED Notes (Signed)
PT & family is requesting for the pt to be a DNR, purple band placed on the pt, EDP made aware of request & reports that the admitting physician will complete the yellow form with the pt, Matt, paramedic aware of the pts request as well,

## 2022-06-24 NOTE — ED Notes (Signed)
Pt tolerated tap water enema well, pt had small amount of BM upon completion, MD notified, pt placed in adult brief

## 2022-06-24 NOTE — ED Triage Notes (Signed)
Pt c/o pain all over; pt has hx of bone cancer and has been having pain  Pt took an oxycodone last night  Pt states he has not had a BM since last week

## 2022-06-25 DIAGNOSIS — G893 Neoplasm related pain (acute) (chronic): Secondary | ICD-10-CM | POA: Diagnosis present

## 2022-06-25 DIAGNOSIS — Z7189 Other specified counseling: Secondary | ICD-10-CM | POA: Diagnosis not present

## 2022-06-25 DIAGNOSIS — Z7951 Long term (current) use of inhaled steroids: Secondary | ICD-10-CM | POA: Diagnosis not present

## 2022-06-25 DIAGNOSIS — J9601 Acute respiratory failure with hypoxia: Secondary | ICD-10-CM | POA: Diagnosis present

## 2022-06-25 DIAGNOSIS — Z8546 Personal history of malignant neoplasm of prostate: Secondary | ICD-10-CM | POA: Diagnosis not present

## 2022-06-25 DIAGNOSIS — Z602 Problems related to living alone: Secondary | ICD-10-CM | POA: Diagnosis present

## 2022-06-25 DIAGNOSIS — K219 Gastro-esophageal reflux disease without esophagitis: Secondary | ICD-10-CM | POA: Diagnosis present

## 2022-06-25 DIAGNOSIS — Z79899 Other long term (current) drug therapy: Secondary | ICD-10-CM | POA: Diagnosis not present

## 2022-06-25 DIAGNOSIS — Z8711 Personal history of peptic ulcer disease: Secondary | ICD-10-CM | POA: Diagnosis not present

## 2022-06-25 DIAGNOSIS — Z66 Do not resuscitate: Secondary | ICD-10-CM | POA: Diagnosis present

## 2022-06-25 DIAGNOSIS — Z9079 Acquired absence of other genital organ(s): Secondary | ICD-10-CM | POA: Diagnosis not present

## 2022-06-25 DIAGNOSIS — J432 Centrilobular emphysema: Secondary | ICD-10-CM | POA: Diagnosis present

## 2022-06-25 DIAGNOSIS — K5903 Drug induced constipation: Secondary | ICD-10-CM | POA: Diagnosis present

## 2022-06-25 DIAGNOSIS — H9190 Unspecified hearing loss, unspecified ear: Secondary | ICD-10-CM | POA: Diagnosis present

## 2022-06-25 DIAGNOSIS — R339 Retention of urine, unspecified: Secondary | ICD-10-CM | POA: Diagnosis present

## 2022-06-25 DIAGNOSIS — Z634 Disappearance and death of family member: Secondary | ICD-10-CM | POA: Diagnosis not present

## 2022-06-25 DIAGNOSIS — C7951 Secondary malignant neoplasm of bone: Secondary | ICD-10-CM | POA: Diagnosis present

## 2022-06-25 DIAGNOSIS — Z886 Allergy status to analgesic agent status: Secondary | ICD-10-CM | POA: Diagnosis not present

## 2022-06-25 DIAGNOSIS — Z515 Encounter for palliative care: Secondary | ICD-10-CM

## 2022-06-25 DIAGNOSIS — J9600 Acute respiratory failure, unspecified whether with hypoxia or hypercapnia: Secondary | ICD-10-CM | POA: Diagnosis present

## 2022-06-25 DIAGNOSIS — C61 Malignant neoplasm of prostate: Secondary | ICD-10-CM | POA: Diagnosis not present

## 2022-06-25 DIAGNOSIS — T402X5A Adverse effect of other opioids, initial encounter: Secondary | ICD-10-CM | POA: Diagnosis present

## 2022-06-25 DIAGNOSIS — Z87891 Personal history of nicotine dependence: Secondary | ICD-10-CM | POA: Diagnosis not present

## 2022-06-25 LAB — BASIC METABOLIC PANEL
Anion gap: 11 (ref 5–15)
BUN: 17 mg/dL (ref 8–23)
CO2: 26 mmol/L (ref 22–32)
Calcium: 8.7 mg/dL — ABNORMAL LOW (ref 8.9–10.3)
Chloride: 101 mmol/L (ref 98–111)
Creatinine, Ser: 0.58 mg/dL — ABNORMAL LOW (ref 0.61–1.24)
GFR, Estimated: >60 mL/min (ref >60)
Glucose, Bld: 86 mg/dL (ref 70–99)
Potassium: 3.9 mmol/L (ref 3.5–5.1)
Sodium: 138 mmol/L (ref 135–145)

## 2022-06-25 LAB — GLUCOSE, CAPILLARY: Glucose-Capillary: 94 mg/dL (ref 70–99)

## 2022-06-25 MED ORDER — ACETAMINOPHEN 500 MG PO TABS: 1000.0000 mg | ORAL_TABLET | Freq: Three times a day (TID) | ORAL | Status: DC | PRN

## 2022-06-25 NOTE — Progress Notes (Signed)
Patient complaining of being cold, noted patient sweating. Vital signs: T-98.2, P-85, R-18, BP-134/70, O2-97% at 2 liters. Patient stated he had not ate anything all day. Blood glucose checked was 94 mg/dL. Patient given crackers. Patient alert and verbal. MD Josephine Cables made aware.

## 2022-06-25 NOTE — Discharge Summary (Signed)
Physician Discharge Summary  Donald Ellis YSA:[id number] DOB: [birth year] DOA: 06/24/2022  PCP: Christain Sacramento, MD  Admit date: 06/24/2022  Discharge date: 06/25/2022  Admitted From:Home  Disposition:  Home with hospice  Recommendations for Outpatient Follow-up:  Follow up with home hospice facility on discharge Continue medications as noted below  Home Health: None  Equipment/Devices: None  Discharge Condition:Stable  CODE STATUS: DNR  Diet recommendation: Heart Healthy  Brief/Interim Summary: Donald Ellis is a 88 y.o. male with medical history significant of Prostate cancer w/ diffuse metastases, GERD, hearing loss, Emphysema. Pt presenting from home w/ 36hr of gradual onset severe pain.  Patient was admitted with mild hypoxemia with no acute pulmonary infectious process and was weaned off of oxygen fairly quickly during the course of this admission.  Chest x-ray with no acute findings and CT angiogram was negative for pulmonary embolus.  He was noted to have musculoskeletal pain in the setting of his metastatic prostate cancer and plans were to essentially continue home medications and use IV fentanyl as needed.  He has been seen by palliative care and family members are agreeable to transition to home hospice care at this time as they feel as though further treatment with oncology has not been meaningful.  No other acute events have been noted throughout the course of this admission and he has been set up for hospice to follow-up in the outpatient setting.  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  Mild acute hypoxemic respiratory failure likely in the setting of respiratory depression with chronic pain  management.  Discharge Instructions  Discharge Instructions     Diet - low sodium heart healthy   Complete by: As directed    Increase activity slowly   Complete by: As directed       Allergies as of 06/25/2022       Reactions   Hydrocodone-acetaminophen Itching   Itching and tingling without a rash        Medication List     TAKE these medications    acetaminophen 650 MG CR tablet Commonly known as: TYLENOL Take 650 mg by mouth every 8 (eight) hours as needed for pain.   albuterol 108 (90 Base) MCG/ACT inhaler Commonly known as: VENTOLIN HFA Inhale 2 puffs into the lungs every 4 (four) hours as needed for wheezing or shortness of breath.   diphenhydramine-acetaminophen 25-500 MG Tabs tablet Commonly known as: TYLENOL PM Take 1 tablet by mouth at bedtime.   ibuprofen 800 MG tablet Commonly known as: ADVIL Take 800 mg by mouth 3 (three) times daily.   Oxycodone HCl 10 MG Tabs Take 10 mg by mouth every 6 (six) hours as needed.   pantoprazole 40 MG tablet Commonly known as: PROTONIX TAKE 1 TABLET BY MOUTH TWICE A DAY What changed: when to take this   Symbicort 160-4.5 MCG/ACT inhaler Generic drug: budesonide-formoterol Inhale 1 puff into the lungs 2 (two) times daily.   tamsulosin 0.4 MG Caps capsule Commonly known as: FLOMAX Take 0.4 mg by mouth 2 (two) times daily.   traMADol 50 MG tablet Commonly known as: ULTRAM Take 50 mg by mouth every 6 (six) hours as needed for moderate pain.   Vitamin D3 125 MCG (5000 UT) Tabs Take 5,000 Units by mouth daily.  Follow-up Information     Christain Sacramento, MD. Schedule an appointment as soon as possible for a visit in 1 week(s).   Specialty: Family Medicine Contact information: [address] [place] [address] [phone]                Allergies  Allergen Reactions   Hydrocodone-Acetaminophen Itching    Itching and tingling without a rash    Consultations: Palliative  care   Procedures/Studies: CT Angio Chest PE W and/or Wo Contrast  Result Date: 06/24/2022 CLINICAL DATA:  Prostate cancer with widespread osseous metastatic disease. Chest pain. * Tracking Code: BO * EXAM: CT ANGIOGRAPHY CHEST WITH CONTRAST TECHNIQUE: Multidetector CT imaging of the chest was performed using the standard protocol during bolus administration of intravenous contrast. Multiplanar CT image reconstructions and MIPs were obtained to evaluate the vascular anatomy. RADIATION DOSE REDUCTION: This exam was performed according to the departmental dose-optimization program which includes automated exposure control, adjustment of the mA and/or kV according to patient size and/or use of iterative reconstruction technique. CONTRAST:  53m OMNIPAQUE IOHEXOL 350 MG/ML SOLN COMPARISON:  Overlapping portions CT abdomen 02/14/2022 FINDINGS: Cardiovascular: Coronary, aortic arch, and branch vessel atherosclerotic vascular disease. No filling defect is identified in the pulmonary arterial tree to suggest pulmonary embolus. Mediastinum/Nodes: Right axillary node 1.3 cm in short axis on image 28 series 4. Lungs/Pleura: Trace right pleural effusion. Severe centrilobular emphysema. There is mucous in the trachea. Upper Abdomen: Unremarkable Musculoskeletal: Extensive and widespread sclerotic osseous metastatic disease in the skeleton. Notable callus formation along the left ninth rib laterally. Old posterior fractures of the left seventh, eighth, and ninth ribs. Review of the MIP images confirms the above findings. IMPRESSION: 1. No filling defect is identified in the pulmonary arterial tree to suggest pulmonary embolus. 2. Extensive and widespread sclerotic osseous metastatic disease. Notable callus formation along the left ninth rib laterally potentially from a late phase healing fracture. 3. Trace right pleural effusion. 4. Severe centrilobular emphysema. Aortic Atherosclerosis (ICD10-I70.0) and Emphysema  (ICD10-J43.9). Coronary atherosclerosis. Electronically Signed   By: WVan ClinesM.D.   On: 06/24/2022 15:06   DG Abdomen 1 View  Result Date: 06/24/2022 CLINICAL DATA:  Constipation EXAM: ABDOMEN - 1 VIEW COMPARISON:  None Available. FINDINGS: Nonobstructive pattern of bowel gas. Moderate burden of stool and stool balls primarily present in the left colon and rectum. No free air in the abdomen on supine radiographs. Disc degenerative disease of the lumbar spine. IMPRESSION: Nonobstructive pattern of bowel gas. Moderate burden of stool and stool balls primarily present in the left colon and rectum. No free air in the abdomen on supine radiographs. Electronically Signed   By: ADelanna AhmadiM.D.   On: 06/24/2022 13:06   DG Knee Complete 4 Views Right  Result Date: 06/24/2022 CLINICAL DATA:  Trauma, fall, pain EXAM: RIGHT KNEE - COMPLETE 4+ VIEW COMPARISON:  None Available. FINDINGS: No recent fracture or dislocation is seen. Degenerative changes are noted with bony spurs in medial, lateral and patellofemoral compartments. Chondrocalcinosis is seen. There are smoothly marginated calcifications along the medial and lateral aspects of the right knee, possibly residual change from previous injury. There is no significant effusion in suprapatellar bursa. There are linear calcifications in the distal portion of quadriceps tendon. There are small metallic densities each measuring less than 3 mm in the soft tissues anteromedial to the distal femur suggesting possible foreign body in the soft tissues. IMPRESSION: No recent fracture or dislocation is seen. Marked degenerative changes  are noted. There are 2 small metallic densities in the soft tissues along the anteromedial aspect of the right knee suggesting foreign bodies. Electronically Signed   By: Elmer Picker M.D.   On: 06/24/2022 12:33   DG Chest Port 1 View  Result Date: 06/24/2022 CLINICAL DATA:  Hypoxia. Shortness of breath. Metastatic  prostate cancer. EXAM: PORTABLE CHEST 1 VIEW COMPARISON:  Radiographs 02/14/2022 and 08/30/2021. FINDINGS: The heart size and mediastinal contours are stable with aortic atherosclerosis. The lungs appear mildly hyperinflated but clear. There is no pleural effusion or pneumothorax. Sclerotic osseous metastatic disease is noted, especially within the proximal right humerus. There is old fracture of the mid left clavicle. There are degenerative changes in the spine and mild scoliosis. No acute osseous findings are evident. Telemetry leads overlie the chest. IMPRESSION: 1. No evidence of acute cardiopulmonary process. 2. Known osseous metastatic disease. Electronically Signed   By: Richardean Sale M.D.   On: 06/24/2022 11:59   DG Shoulder Right  Result Date: 06/24/2022 CLINICAL DATA:  Pain.  History of bone cancer. EXAM: RIGHT SHOULDER - 2+ VIEW COMPARISON:  None Available. FINDINGS: There is no evidence of fracture or dislocation. Severe degenerative changes seen involving the right acromioclavicular and glenohumeral joints. Ill-defined sclerotic densities are seen involving the visualized proximal right humerus concerning for metastatic disease. Soft tissues are unremarkable. IMPRESSION: No fracture or dislocation is noted. Severe degenerative changes are noted as described above. Ill-defined sclerotic densities are noted involving proximal right humerus concerning for metastatic disease. Electronically Signed   By: Marijo Conception M.D.   On: 06/24/2022 10:21   DG Hip Unilat W or Wo Pelvis 2-3 Views Right  Result Date: 06/24/2022 CLINICAL DATA:  Pain.  History of bone cancer. EXAM: DG HIP (WITH OR WITHOUT PELVIS) 2-3V RIGHT COMPARISON:  February 14, 2022.  August 30, 2021. FINDINGS: There is no evidence of hip fracture or dislocation. Sclerotic density is seen involving the right acetabulum and ischial tuberosity consistent with metastatic disease. Multiple other sclerotic densities are noted concerning for  metastatic disease. IMPRESSION: Findings consistent with osseous metastases, particularly in the right acetabular region. No fracture or dislocation is noted. Electronically Signed   By: Marijo Conception M.D.   On: 06/24/2022 10:19     Discharge Exam: Vitals:   06/25/22 0702 06/25/22 1307  BP:  109/62  Pulse:  85  Resp:  18  Temp:  98 F (36.7 C)  SpO2: 100% 94%   Vitals:   06/25/22 0122 06/25/22 0549 06/25/22 0702 06/25/22 1307  BP: 134/70 125/62  109/62  Pulse: 85 82  85  Resp: '18 18  18  '$ Temp: 97.8 F (36.6 C) 98.1 F (36.7 C)  98 F (36.7 C)  TempSrc: Oral Oral  Oral  SpO2: 97% 100% 100% 94%  Weight:      Height:        General: Pt is alert, awake, not in acute distress Cardiovascular: RRR, S1/S2 +, no rubs, no gallops Respiratory: CTA bilaterally, no wheezing, no rhonchi Abdominal: Soft, NT, ND, bowel sounds + Extremities: no edema, no cyanosis    The results of significant diagnostics from this hospitalization (including imaging, microbiology, ancillary and laboratory) are listed below for reference.     Microbiology: No results found for this or any previous visit (from the past 240 hour(s)).   Labs: BNP (last 3 results) No results for input(s): "BNP" in the last 8760 hours. Basic Metabolic Panel: Recent Labs  Lab 06/24/22 0917 06/25/22  0434  NA 134* 138  K 4.1 3.9  CL 95* 101  CO2 28 26  GLUCOSE 97 86  BUN 20 17  CREATININE 0.59* 0.58*  CALCIUM 8.9 8.7*   Liver Function Tests: Recent Labs  Lab 06/24/22 0917  AST 50*  ALT 10  ALKPHOS 1,659*  BILITOT 1.0  PROT 7.0  ALBUMIN 3.3*   No results for input(s): "LIPASE", "AMYLASE" in the last 168 hours. No results for input(s): "AMMONIA" in the last 168 hours. CBC: Recent Labs  Lab 06/24/22 0917  WBC 8.2  NEUTROABS 5.3  HGB 13.0  HCT 39.0  MCV 95.4  PLT 169   Cardiac Enzymes: Recent Labs  Lab 06/24/22 0917  CKTOTAL 161   BNP: Invalid input(s): "POCBNP" CBG: Recent Labs  Lab  06/25/22 0146  GLUCAP 94   D-Dimer No results for input(s): "DDIMER" in the last 72 hours. Hgb A1c No results for input(s): "HGBA1C" in the last 72 hours. Lipid Profile No results for input(s): "CHOL", "HDL", "LDLCALC", "TRIG", "CHOLHDL", "LDLDIRECT" in the last 72 hours. Thyroid function studies No results for input(s): "TSH", "T4TOTAL", "T3FREE", "THYROIDAB" in the last 72 hours.  Invalid input(s): "FREET3" Anemia work up No results for input(s): "VITAMINB12", "FOLATE", "FERRITIN", "TIBC", "IRON", "RETICCTPCT" in the last 72 hours. Urinalysis    Component Value Date/Time   COLORURINE YELLOW 06/24/2022 0920   APPEARANCEUR CLEAR 06/24/2022 0920   LABSPEC 1.017 06/24/2022 0920   PHURINE 6.0 06/24/2022 0920   GLUCOSEU NEGATIVE 06/24/2022 0920   HGBUR MODERATE (A) 06/24/2022 0920   BILIRUBINUR NEGATIVE 06/24/2022 0920   KETONESUR 20 (A) 06/24/2022 0920   PROTEINUR NEGATIVE 06/24/2022 0920   NITRITE NEGATIVE 06/24/2022 0920   LEUKOCYTESUR NEGATIVE 06/24/2022 0920   Sepsis Labs Recent Labs  Lab 06/24/22 0917  WBC 8.2   Microbiology No results found for this or any previous visit (from the past 240 hour(s)).   Time coordinating discharge: 35 minutes  SIGNED:   Rodena Goldmann, DO [facility] 06/25/2022, 1:46 PM  If 7PM-7AM, please contact night-coverage [email or web address]

## 2022-06-25 NOTE — Progress Notes (Signed)
Nsg Discharge Note  Admit Date:  06/24/2022 Discharge date: 06/25/2022   Donald Ellis to be D/C'd Home per MD order.  AVS completed.  Copy for chart, and copy for patient signed, and dated. Patient/caregiver able to verbalize understanding.  Discharge Medication: Allergies as of 06/25/2022       Reactions   Hydrocodone-acetaminophen Itching   Itching and tingling without a rash        Medication List     TAKE these medications    acetaminophen 650 MG CR tablet Commonly known as: TYLENOL Take 650 mg by mouth every 8 (eight) hours as needed for pain.   albuterol 108 (90 Base) MCG/ACT inhaler Commonly known as: VENTOLIN HFA Inhale 2 puffs into the lungs every 4 (four) hours as needed for wheezing or shortness of breath.   diphenhydramine-acetaminophen 25-500 MG Tabs tablet Commonly known as: TYLENOL PM Take 1 tablet by mouth at bedtime.   ibuprofen 800 MG tablet Commonly known as: ADVIL Take 800 mg by mouth 3 (three) times daily.   Oxycodone HCl 10 MG Tabs Take 10 mg by mouth every 6 (six) hours as needed.   pantoprazole 40 MG tablet Commonly known as: PROTONIX TAKE 1 TABLET BY MOUTH TWICE A DAY What changed: when to take this   Symbicort 160-4.5 MCG/ACT inhaler Generic drug: budesonide-formoterol Inhale 1 puff into the lungs 2 (two) times daily.   tamsulosin 0.4 MG Caps capsule Commonly known as: FLOMAX Take 0.4 mg by mouth 2 (two) times daily.   traMADol 50 MG tablet Commonly known as: ULTRAM Take 50 mg by mouth every 6 (six) hours as needed for moderate pain.   Vitamin D3 125 MCG (5000 UT) Tabs Take 5,000 Units by mouth daily.        Discharge Assessment: Vitals:   06/25/22 0702 06/25/22 1307  BP:  109/62  Pulse:  85  Resp:  18  Temp:  98 F (36.7 C)  SpO2: 100% 94%   Skin clean, dry and intact without evidence of skin break down, no evidence of skin tears noted. IV catheter discontinued intact. Site without signs and symptoms of complications  - no redness or edema noted at insertion site, patient denies c/o pain - only slight tenderness at site.  Dressing with slight pressure applied.  D/c Instructions-Education: Discharge instructions given to patient/family with verbalized understanding. D/c education completed with patient/family including follow up instructions, medication list, d/c activities limitations if indicated, with other d/c instructions as indicated by MD - patient able to verbalize understanding, all questions fully answered. Patient instructed to return to ED, call 911, or call MD for any changes in condition.  Patient escorted via [place], and D/C home via private auto.  Dorcas Mcmurray, LPN 7/40/8144 8:18 PM

## 2022-06-25 NOTE — Consult Note (Signed)
Consultation Note Date: 06/25/2022   Patient Name: Donald Ellis  DOB: [birth year]  MRN: [id number]  Age / Sex: 88 y.o., male  PCP: Christain Sacramento, MD Referring Physician: Rodena Goldmann, DO  Reason for Consultation: Establishing goals of care, Hospice Evaluation, and Pain control  HPI/Patient Profile: 88 y.o. male  with past medical history of prostate cancer with diffuse bone metastases,bladder outlet obstruction s/p TURP, emphysema, hearing loss, GERD, urinary retention admitted on 06/24/2022 with worsening generalized pain with severe constipation (LBM 7 days PTA).   Clinical Assessment and Goals of Care: I met today with [name] after reviewing records and discussing with Dr. Manuella Ghazi. His son, daughter, and son-in-law are at bedside. [name] is very hard of hearing but I was able to sit close to him and speak very loudly and we were able to communicate. [name] expresses that his pain is much improved (he is sitting up in recliner). Family notes plans for him to go home with his daughter to her home. He was living by himself until now after his wife passed away ~8 months ago. This has been a difficult 8 months for them. [name] talks about not wanting to be a burden to his family - we provide him reassurance that he is not a burden but we recognize how difficult it is for him to have to rely on others more now. When I ask [name] about his hopes for the future and his goals moving forward he tearfully shares about God preparing a home for him in heaven and joining his wife. He is very spiritual and very faithful. He is clear about his goals not to be a burden to his family and being ready when God calls him home.   I discussed with [name] and his family my recommendation to consider having help from hospice at home for extra support and symptom management to hopefully prevent further  hospitalizations like this one. Daughter shares that they have had experience with hospice and had a very good experience. After discussion [name] and his family believe this would be very helpful for them. They would like to work with [facility]. I also reviewed medications and symptom management with family at bedside.   All questions/concerns addressed. Emotional support provided. Updated Dr. Manuella Ghazi and TOC.   Primary Decision Maker PATIENT    SUMMARY OF RECOMMENDATIONS   - Home (to daughter's home) with hospice support  Code Status/Advance Care Planning: DNR   Symptom Management:  Scheduled ibuprofen 800 mg every 8 hours. Tylenol prn between ibuprofen doses for breakthrough pain. Recommended food with medications and risks medication. Encouraged further discussion with hospice if symptoms worsen or new symptoms arise.   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 The following aspects are pertinent.  Past Medical History:  Diagnosis Date   Emphysema lung ([place]) 06/24/2022   Foley catheter in place    LAST CHANGED 07-04-2001   GERD (gastroesophageal reflux disease) 06/24/2022   HOH (hard of hearing)    Urinary retention    Social History   Socioeconomic History   Marital status: Married    Spouse name: Not on file   Number of children: Not on file   Years of education: Not on file   Highest education level: Not on file  Occupational History   Not on file  Tobacco Use   Smoking status: Former   Smokeless tobacco: Never   Tobacco comments:    Hasn't smoked in 40 years  Vaping Use   Vaping Use: Never used  Substance and  Sexual Activity   Alcohol use: Not Currently   Drug use: Never   Sexual activity: Not Currently  Other Topics Concern   Not on file  Social History Narrative   Not on file   Social Determinants of Health   Financial Resource Strain: Not on file  Food Insecurity: Not on file  Transportation Needs: Not on file  Physical Activity: Not on file  Stress: Not on file  Social Connections: Not on file   Family History  Problem Relation Age of Onset   GI Bleed Neg Hx    Scheduled Meds:  heparin  5,000 Units Subcutaneous Q8H   ibuprofen  800 mg Oral TID   mometasone-formoterol  2 puff Inhalation BID   pantoprazole  40 mg Oral Daily   polyethylene glycol  34 g Oral BID   senna  1 tablet Oral BID   tamsulosin  0.4 mg Oral BID   Continuous Infusions:  sodium chloride 75 mL/hr at 06/25/22 0953   methocarbamol (ROBAXIN) IV     PRN Meds:.albuterol, bisacodyl, fentaNYL (SUBLIMAZE) injection, methocarbamol (ROBAXIN) IV, sodium phosphate, traMADol Allergies  Allergen Reactions   Hydrocodone-Acetaminophen Itching    Itching and tingling without a rash   Review of Systems  Constitutional:  Positive for activity change. Negative for appetite change.  Gastrointestinal:  Positive for constipation.    Physical Exam Vitals and nursing note reviewed.  Constitutional:      Appearance: He is ill-appearing.     Comments: Elderly, thin, frail  Cardiovascular:     Rate and Rhythm: Normal rate.  Pulmonary:     Effort: No tachypnea, accessory muscle usage or respiratory distress.  Abdominal:     Palpations: Abdomen is soft.  Neurological:     Mental Status: He is alert and oriented to person, place, and time.     Comments: Oriented but very hard of hearing     Vital Signs: BP 125/62 (BP Location: Left Arm)   Pulse 82   Temp 98.1 F (36.7 C) (Oral)   Resp 18   Ht $R'5\' 9"'Cn$  (1.753 m)   Wt 59 kg   SpO2 100%   BMI 19.20 kg/m  Pain Scale: 0-10   Pain Score: 0-No pain   SpO2: SpO2:  100 % O2 Device:SpO2: 100 % O2 Flow Rate: .O2 Flow Rate (L/min): 2 L/min  IO: Intake/output summary:  Intake/Output Summary (Last 24 hours) at 06/25/2022 1015 Last data filed at 06/25/2022 0955 Gross per 24 hour  Intake 1267.93 ml  Output 1200 ml  Net 67.93 ml    LBM: Last BM Date : 06/24/22 Baseline Weight: Weight:  59 kg Most recent weight: Weight: 59 kg     Palliative Assessment/Data:     Time Total: 55 min  Greater than 50%  of this time was spent counseling and coordinating care related to the above assessment and plan.  Signed by: Vinie Sill, NP Palliative Medicine Team Pager # [phone] (M-F 8a-5p) Team Phone # [phone] (Nights/Weekends)

## 2022-06-25 NOTE — Progress Notes (Signed)
Per palliative care NP, patient referred to [facility] for hospice services at home.    Donald Ellis, [name], LCSW

## 2022-07-24 ENCOUNTER — Other Ambulatory Visit: Payer: Self-pay

## 2022-07-24 ENCOUNTER — Encounter (HOSPITAL_COMMUNITY): Payer: Self-pay | Admitting: *Deleted

## 2022-07-24 ENCOUNTER — Emergency Department (HOSPITAL_COMMUNITY): Payer: Medicare Other

## 2022-07-24 ENCOUNTER — Inpatient Hospital Stay (HOSPITAL_COMMUNITY)
Admission: EM | Admit: 2022-07-24 | Discharge: 2022-07-26 | DRG: 065 | Disposition: A | Discharge status: Hospice | Payer: Medicare Other | Attending: Internal Medicine | Admitting: Internal Medicine

## 2022-07-24 DIAGNOSIS — Z87891 Personal history of nicotine dependence: Secondary | ICD-10-CM | POA: Diagnosis not present

## 2022-07-24 DIAGNOSIS — Z885 Allergy status to narcotic agent status: Secondary | ICD-10-CM | POA: Diagnosis not present

## 2022-07-24 DIAGNOSIS — R29702 NIHSS score 2: Secondary | ICD-10-CM | POA: Diagnosis not present

## 2022-07-24 DIAGNOSIS — G8191 Hemiplegia, unspecified affecting right dominant side: Secondary | ICD-10-CM | POA: Diagnosis not present

## 2022-07-24 DIAGNOSIS — Z66 Do not resuscitate: Secondary | ICD-10-CM | POA: Diagnosis not present

## 2022-07-24 DIAGNOSIS — I63541 Cerebral infarction due to unspecified occlusion or stenosis of right cerebellar artery: Principal | ICD-10-CM | POA: Diagnosis present

## 2022-07-24 DIAGNOSIS — I639 Cerebral infarction, unspecified: Secondary | ICD-10-CM | POA: Diagnosis present

## 2022-07-24 DIAGNOSIS — Z79899 Other long term (current) drug therapy: Secondary | ICD-10-CM | POA: Diagnosis not present

## 2022-07-24 DIAGNOSIS — K219 Gastro-esophageal reflux disease without esophagitis: Secondary | ICD-10-CM | POA: Diagnosis not present

## 2022-07-24 DIAGNOSIS — G893 Neoplasm related pain (acute) (chronic): Secondary | ICD-10-CM | POA: Diagnosis not present

## 2022-07-24 DIAGNOSIS — H9190 Unspecified hearing loss, unspecified ear: Secondary | ICD-10-CM | POA: Diagnosis not present

## 2022-07-24 DIAGNOSIS — C61 Malignant neoplasm of prostate: Secondary | ICD-10-CM | POA: Diagnosis present

## 2022-07-24 DIAGNOSIS — Z515 Encounter for palliative care: Secondary | ICD-10-CM

## 2022-07-24 DIAGNOSIS — J439 Emphysema, unspecified: Secondary | ICD-10-CM | POA: Diagnosis present

## 2022-07-24 DIAGNOSIS — Z7951 Long term (current) use of inhaled steroids: Secondary | ICD-10-CM | POA: Diagnosis not present

## 2022-07-24 DIAGNOSIS — C799 Secondary malignant neoplasm of unspecified site: Secondary | ICD-10-CM | POA: Diagnosis present

## 2022-07-24 DIAGNOSIS — R350 Frequency of micturition: Secondary | ICD-10-CM | POA: Diagnosis not present

## 2022-07-24 DIAGNOSIS — D630 Anemia in neoplastic disease: Secondary | ICD-10-CM | POA: Diagnosis not present

## 2022-07-24 LAB — URINALYSIS, ROUTINE W REFLEX MICROSCOPIC
Bilirubin Urine: NEGATIVE
Glucose, UA: NEGATIVE mg/dL
Hgb urine dipstick: NEGATIVE
Ketones, ur: NEGATIVE mg/dL
Leukocytes,Ua: NEGATIVE
Nitrite: NEGATIVE
Protein, ur: NEGATIVE mg/dL
Specific Gravity, Urine: 1.016 (ref 1.005–1.030)
pH: 6 (ref 5.0–8.0)

## 2022-07-24 LAB — CBC WITH DIFFERENTIAL/PLATELET
Abs Immature Granulocytes: 0.11 10*3/uL — ABNORMAL HIGH (ref 0.00–0.07)
Basophils Absolute: 0.1 10*3/uL (ref 0.0–0.1)
Basophils Relative: 1 %
Eosinophils Absolute: 0.3 10*3/uL (ref 0.0–0.5)
Eosinophils Relative: 3 %
HCT: 32.9 % — ABNORMAL LOW (ref 39.0–52.0)
Hemoglobin: 10.6 g/dL — ABNORMAL LOW (ref 13.0–17.0)
Immature Granulocytes: 1 %
Lymphocytes Relative: 25 %
Lymphs Abs: 2 10*3/uL (ref 0.7–4.0)
MCH: 31.9 pg (ref 26.0–34.0)
MCHC: 32.2 g/dL (ref 30.0–36.0)
MCV: 99.1 fL (ref 80.0–100.0)
Monocytes Absolute: 0.9 10*3/uL (ref 0.1–1.0)
Monocytes Relative: 11 %
Neutro Abs: 4.9 10*3/uL (ref 1.7–7.7)
Neutrophils Relative %: 59 %
Platelets: 149 10*3/uL — ABNORMAL LOW (ref 150–400)
RBC: 3.32 MIL/uL — ABNORMAL LOW (ref 4.22–5.81)
RDW: 17 % — ABNORMAL HIGH (ref 11.5–15.5)
WBC: 8.3 10*3/uL (ref 4.0–10.5)
nRBC: 0.2 % (ref 0.0–0.2)

## 2022-07-24 LAB — COMPREHENSIVE METABOLIC PANEL
ALT: 14 U/L (ref 0–44)
AST: 80 U/L — ABNORMAL HIGH (ref 15–41)
Albumin: 3.5 g/dL (ref 3.5–5.0)
Alkaline Phosphatase: 1689 U/L — ABNORMAL HIGH (ref 38–126)
Anion gap: 4 — ABNORMAL LOW (ref 5–15)
BUN: 26 mg/dL — ABNORMAL HIGH (ref 8–23)
CO2: 24 mmol/L (ref 22–32)
Calcium: 7.8 mg/dL — ABNORMAL LOW (ref 8.9–10.3)
Chloride: 107 mmol/L (ref 98–111)
Creatinine, Ser: 0.68 mg/dL (ref 0.61–1.24)
GFR, Estimated: >60 mL/min (ref >60)
Glucose, Bld: 102 mg/dL — ABNORMAL HIGH (ref 70–99)
Potassium: 4.4 mmol/L (ref 3.5–5.1)
Sodium: 135 mmol/L (ref 135–145)
Total Bilirubin: 0.6 mg/dL (ref 0.3–1.2)
Total Protein: 7.3 g/dL (ref 6.5–8.1)

## 2022-07-24 LAB — PROTIME-INR
INR: 1.2 (ref 0.8–1.2)
Prothrombin Time: 14.7 seconds (ref 11.4–15.2)

## 2022-07-24 LAB — LACTIC ACID, PLASMA
Lactic Acid, Venous: 1 mmol/L (ref 0.5–1.9)
Lactic Acid, Venous: 1 mmol/L (ref 0.5–1.9)

## 2022-07-24 MED ORDER — FENTANYL CITRATE PF 50 MCG/ML IJ SOSY
50.0000 ug | PREFILLED_SYRINGE | Freq: Once | INTRAMUSCULAR | Status: DC
  Filled 2022-07-24: qty 1

## 2022-07-24 MED ORDER — FENTANYL CITRATE PF 50 MCG/ML IJ SOSY
50.0000 ug | PREFILLED_SYRINGE | Freq: Once | INTRAMUSCULAR | Status: AC
  Administered 2022-07-24: 50 ug via INTRAVENOUS

## 2022-07-24 NOTE — ED Triage Notes (Signed)
Pt c/o generalized body aches x 2 days ago; pt has cancer

## 2022-07-24 NOTE — ED Notes (Signed)
ED Provider at bedside. 

## 2022-07-24 NOTE — ED Provider Notes (Signed)
[facility] EMERGENCY DEPARTMENT Provider Note   CSN: [id number] Arrival date & time: 07/24/22  1750     History Chief Complaint  Patient presents with   Generalized Body Aches    Donald Ellis is a 88 y.o. male patient who presents to the emergency department for further evaluation of generalized body pain.  This has been an ongoing issue and thought to be related to his metastatic prostate cancer.  Chart review reveals that the patient was recently discharged from the hospital on 06/25/2022 secondary to acute respiratory failure.  He was complaining of generalized body pain at that time.  Patient receives antihormone injections in addition to chemotherapy every 3 months with his urologist.  Patient has been taking his chronic opioid medication and states it only works for a couple of hours.  He endorses urinary frequency which is a chronic problem.  He denies fever, chills, chest pain, shortness of breath, cough, abdominal pain, nausea, vomiting, diarrhea, hematuria.  HPI     Home Medications Prior to Admission medications   Medication Sig Start Date End Date Taking? Authorizing Provider  acetaminophen (TYLENOL) 650 MG CR tablet Take 650 mg by mouth every 8 (eight) hours as needed for pain.    [provider]  albuterol (VENTOLIN HFA) 108 (90 Base) MCG/ACT inhaler Inhale 2 puffs into the lungs every 4 (four) hours as needed for wheezing or shortness of breath. 02/26/21   [provider]  Cholecalciferol (VITAMIN D3) 125 MCG (5000 UT) TABS Take 5,000 Units by mouth daily.    [provider]  diphenhydramine-acetaminophen (TYLENOL PM) 25-500 MG TABS tablet Take 1 tablet by mouth at bedtime.    [provider]  ibuprofen (ADVIL) 800 MG tablet Take 800 mg by mouth 3 (three) times daily. 02/21/22   [provider]  Oxycodone HCl 10 MG TABS Take 10 mg by mouth every 6 (six) hours as needed. 06/20/22   [provider]  pantoprazole (PROTONIX)  40 MG tablet TAKE 1 TABLET BY MOUTH TWICE A DAY Patient taking differently: Take 40 mg by mouth daily. 07/23/21   Doran Stabler, MD  SYMBICORT 160-4.5 MCG/ACT inhaler Inhale 1 puff into the lungs 2 (two) times daily. 01/24/21   [provider]  tamsulosin (FLOMAX) 0.4 MG CAPS capsule Take 0.4 mg by mouth 2 (two) times daily. 09/21/20   [provider]  traMADol (ULTRAM) 50 MG tablet Take 50 mg by mouth every 6 (six) hours as needed for moderate pain. 08/21/21   [provider]      Allergies    Hydrocodone-acetaminophen    Review of Systems   Review of Systems  All other systems reviewed and are negative.   Physical Exam Updated Vital Signs BP (!) 156/85 (BP Location: Left Arm)   Pulse 84   Temp 98.1 F (36.7 C) (Oral)   Resp 14   Ht 5' 8" (1.727 m)   Wt 72.6 kg   SpO2 95%   BMI 24.33 kg/m  Physical Exam Vitals and nursing note reviewed.  Constitutional:      General: He is not in acute distress.    Appearance: Normal appearance.  HENT:     Head: Normocephalic and atraumatic.  Eyes:     General:        Right eye: No discharge.        Left eye: No discharge.  Cardiovascular:     Comments: Regular rate and rhythm.  S1/S2 are distinct without any evidence  of murmur, rubs, or gallops.  Radial pulses are 2+ bilaterally.  Dorsalis pedis pulses are 2+ bilaterally.  No evidence of pedal edema. Pulmonary:     Comments: Clear to auscultation bilaterally.  Normal effort.  No respiratory distress.  No evidence of wheezes, rales, or rhonchi heard throughout. Abdominal:     General: Abdomen is flat. Bowel sounds are normal. There is no distension.     Tenderness: There is no abdominal tenderness. There is no guarding or rebound.  Musculoskeletal:        General: Normal range of motion.     Cervical back: Neck supple.  Skin:    General: Skin is warm and dry.     Findings: No rash.  Neurological:     General: No focal deficit present.     Mental  Status: He is alert.  Psychiatric:        Mood and Affect: Mood normal.        Behavior: Behavior normal.     ED Results / Procedures / Treatments   Labs (all labs ordered are listed, but only abnormal results are displayed) Labs Reviewed  COMPREHENSIVE METABOLIC PANEL  LACTIC ACID, PLASMA  LACTIC ACID, PLASMA  CBC WITH DIFFERENTIAL/PLATELET  PROTIME-INR  URINALYSIS, ROUTINE W REFLEX MICROSCOPIC    EKG None  Radiology No results found.  Procedures Procedures    Medications Ordered in ED Medications - No data to display  ED Course/ Medical Decision Making/ A&P Clinical Course as of 07/24/22 2229  Wed Jul 24, 2022  2103 I discussed this case with my attending physician who cosigned this note including patient's presenting symptoms, physical exam, and planned diagnostics and interventions. Attending physician stated agreement with plan or made changes to plan which were implemented.   Attending physician assessed patient at bedside.   [CF]  2225 I spoke with Dr. Maryan Rued ED physician at [facility] who accepts transfer. [CF]  2228 CBC with Differential(!) Mild anemia comparison to previous.  No evidence of leukocytosis. [CF]  2228 Urinalysis, Routine w reflex microscopic Urine, Clean Catch Normal. [CF]  2228 Comprehensive metabolic panel(!) Elevated BUN and alk phos which seems to be at the patient's baseline. [CF]  2228 Protime-INR Normal. [CF]  2228 Lactic acid, plasma Normal. [CF]  2229 DG Chest 2 View There is evidence of metastatic disease.  I do agree with radiology interpretation.  I personally ordered and interpreted this image. [CF]  2229 CT Head Wo Contrast There is evidence of a new hypodensity in the setting of cancer could be metastatic disease to the brain.  I do agree with the radiologist to rotation.  Patient is in need of MRI.  We do not have MRI at this time.  I will transfer the patient to [facility] for further evaluation.  I do agree with the  radiologist interpretation. [CF]    Clinical Course User Index [CF] Hendricks Limes, PA-C                           Medical Decision Making Donald Ellis is a 88 y.o. male patient who presents to the emergency department today for further evaluation of generalized body pain.  In the setting of cancer this could be exacerbation of his chronic pain.  We will get broad screening labs to evaluate for infection or other etiologies to explain his pain.  Also get a chest x-ray in addition to CT head.  I will plan to reassess.  Given the clinical scenario, I do feel the patient would likely benefit from MRI given his history of cancer and chronic pain.  Right now, patient's pain is controlled.  I discussed the plan with family at bedside in addition to the patient.  They are all on board in agreement.  I spoke with Dr. Maryan Rued ED physician who accepts transfer.  Patient resting comfortably in the emergency department currently and is safe for transport.   Amount and/or Complexity of Data Reviewed Labs: ordered. Decision-making details documented in ED Course. Radiology: ordered. Decision-making details documented in ED Course.  Risk Prescription drug management.   Final Clinical Impression(s) / ED Diagnoses Final diagnoses:  None    Rx / DC Orders ED Discharge Orders     None         Cherrie Gauze 07/24/22 2232    Isla Pence, MD 07/24/22 [phone]

## 2022-07-24 NOTE — ED Notes (Signed)
Pt is hard of hearing  

## 2022-07-25 ENCOUNTER — Emergency Department (HOSPITAL_COMMUNITY): Payer: Medicare Other

## 2022-07-25 ENCOUNTER — Encounter (HOSPITAL_COMMUNITY): Payer: Self-pay | Admitting: Internal Medicine

## 2022-07-25 DIAGNOSIS — J439 Emphysema, unspecified: Secondary | ICD-10-CM | POA: Diagnosis present

## 2022-07-25 DIAGNOSIS — G8191 Hemiplegia, unspecified affecting right dominant side: Secondary | ICD-10-CM | POA: Diagnosis present

## 2022-07-25 DIAGNOSIS — I639 Cerebral infarction, unspecified: Secondary | ICD-10-CM | POA: Diagnosis present

## 2022-07-25 DIAGNOSIS — G893 Neoplasm related pain (acute) (chronic): Secondary | ICD-10-CM | POA: Diagnosis present

## 2022-07-25 DIAGNOSIS — C61 Malignant neoplasm of prostate: Secondary | ICD-10-CM

## 2022-07-25 DIAGNOSIS — Z87891 Personal history of nicotine dependence: Secondary | ICD-10-CM | POA: Diagnosis not present

## 2022-07-25 DIAGNOSIS — Z79899 Other long term (current) drug therapy: Secondary | ICD-10-CM | POA: Diagnosis not present

## 2022-07-25 DIAGNOSIS — K219 Gastro-esophageal reflux disease without esophagitis: Secondary | ICD-10-CM | POA: Diagnosis present

## 2022-07-25 DIAGNOSIS — R350 Frequency of micturition: Secondary | ICD-10-CM | POA: Diagnosis present

## 2022-07-25 DIAGNOSIS — D630 Anemia in neoplastic disease: Secondary | ICD-10-CM | POA: Diagnosis present

## 2022-07-25 DIAGNOSIS — Z885 Allergy status to narcotic agent status: Secondary | ICD-10-CM | POA: Diagnosis not present

## 2022-07-25 DIAGNOSIS — H9190 Unspecified hearing loss, unspecified ear: Secondary | ICD-10-CM | POA: Diagnosis present

## 2022-07-25 DIAGNOSIS — R29702 NIHSS score 2: Secondary | ICD-10-CM | POA: Diagnosis present

## 2022-07-25 DIAGNOSIS — Z7189 Other specified counseling: Secondary | ICD-10-CM

## 2022-07-25 DIAGNOSIS — C799 Secondary malignant neoplasm of unspecified site: Secondary | ICD-10-CM | POA: Diagnosis present

## 2022-07-25 DIAGNOSIS — J431 Panlobular emphysema: Secondary | ICD-10-CM

## 2022-07-25 DIAGNOSIS — Z66 Do not resuscitate: Secondary | ICD-10-CM | POA: Diagnosis present

## 2022-07-25 DIAGNOSIS — Z515 Encounter for palliative care: Secondary | ICD-10-CM | POA: Diagnosis not present

## 2022-07-25 DIAGNOSIS — C7989 Secondary malignant neoplasm of other specified sites: Secondary | ICD-10-CM | POA: Diagnosis not present

## 2022-07-25 DIAGNOSIS — I63541 Cerebral infarction due to unspecified occlusion or stenosis of right cerebellar artery: Secondary | ICD-10-CM | POA: Diagnosis present

## 2022-07-25 DIAGNOSIS — Z7951 Long term (current) use of inhaled steroids: Secondary | ICD-10-CM | POA: Diagnosis not present

## 2022-07-25 LAB — CBC
HCT: 32.6 % — ABNORMAL LOW (ref 39.0–52.0)
Hemoglobin: 10.6 g/dL — ABNORMAL LOW (ref 13.0–17.0)
MCH: 32.2 pg (ref 26.0–34.0)
MCHC: 32.5 g/dL (ref 30.0–36.0)
MCV: 99.1 fL (ref 80.0–100.0)
Platelets: 144 10*3/uL — ABNORMAL LOW (ref 150–400)
RBC: 3.29 MIL/uL — ABNORMAL LOW (ref 4.22–5.81)
RDW: 17 % — ABNORMAL HIGH (ref 11.5–15.5)
WBC: 7.1 10*3/uL (ref 4.0–10.5)
nRBC: 0 % (ref 0.0–0.2)

## 2022-07-25 LAB — CREATININE, SERUM
Creatinine, Ser: 0.51 mg/dL — ABNORMAL LOW (ref 0.61–1.24)
GFR, Estimated: >60 mL/min (ref >60)

## 2022-07-25 MED ORDER — POLYVINYL ALCOHOL 1.4 % OP SOLN: 1.0000 [drp] | Freq: Four times a day (QID) | OPHTHALMIC | Status: DC | PRN

## 2022-07-25 MED ORDER — LORAZEPAM 2 MG/ML IJ SOLN: 1.0000 mg | INTRAMUSCULAR | Status: DC | PRN

## 2022-07-25 MED ORDER — ACETAMINOPHEN 160 MG/5ML PO SOLN: 650.0000 mg | ORAL | Status: DC | PRN

## 2022-07-25 MED ORDER — PANTOPRAZOLE SODIUM 40 MG PO TBEC
40.0000 mg | DELAYED_RELEASE_TABLET | Freq: Every day | ORAL | Status: DC
  Administered 2022-07-25 – 2022-07-26 (×2): 40 mg via ORAL
  Filled 2022-07-25 (×2): qty 1

## 2022-07-25 MED ORDER — MORPHINE SULFATE (PF) 2 MG/ML IV SOLN: 2.0000 mg | INTRAVENOUS | Status: DC | PRN

## 2022-07-25 MED ORDER — STROKE: EARLY STAGES OF RECOVERY BOOK
Freq: Once | Status: AC
  Filled 2022-07-25: qty 1

## 2022-07-25 MED ORDER — MORPHINE SULFATE (CONCENTRATE) 10 MG/0.5ML PO SOLN
5.0000 mg | ORAL | Status: DC
  Administered 2022-07-25 – 2022-07-26 (×5): 5 mg via ORAL
  Filled 2022-07-25 (×5): qty 0.5

## 2022-07-25 MED ORDER — FENTANYL CITRATE PF 50 MCG/ML IJ SOSY
50.0000 ug | PREFILLED_SYRINGE | Freq: Once | INTRAMUSCULAR | Status: AC
  Administered 2022-07-25: 50 ug via INTRAVENOUS
  Filled 2022-07-25: qty 1

## 2022-07-25 MED ORDER — IBUPROFEN 200 MG PO TABS
800.0000 mg | ORAL_TABLET | Freq: Three times a day (TID) | ORAL | Status: DC
  Administered 2022-07-25 – 2022-07-26 (×3): 800 mg via ORAL
  Filled 2022-07-25 (×2): qty 4
  Filled 2022-07-25: qty 1

## 2022-07-25 MED ORDER — OXYCODONE HCL 5 MG PO TABS: 5.0000 mg | ORAL_TABLET | Freq: Four times a day (QID) | ORAL | Status: DC | PRN

## 2022-07-25 MED ORDER — BIOTENE DRY MOUTH MT LIQD: 15.0000 mL | OROMUCOSAL | Status: DC | PRN

## 2022-07-25 MED ORDER — SENNOSIDES-DOCUSATE SODIUM 8.6-50 MG PO TABS
1.0000 | ORAL_TABLET | Freq: Two times a day (BID) | ORAL | Status: DC
  Administered 2022-07-25 – 2022-07-26 (×3): 1 via ORAL
  Filled 2022-07-25 (×3): qty 1

## 2022-07-25 MED ORDER — ONDANSETRON HCL 4 MG/2ML IJ SOLN: 4.0000 mg | Freq: Four times a day (QID) | INTRAMUSCULAR | Status: DC | PRN

## 2022-07-25 MED ORDER — MOMETASONE FURO-FORMOTEROL FUM 200-5 MCG/ACT IN AERO
2.0000 | INHALATION_SPRAY | Freq: Two times a day (BID) | RESPIRATORY_TRACT | Status: DC
  Administered 2022-07-25 – 2022-07-26 (×3): 2 via RESPIRATORY_TRACT
  Filled 2022-07-25: qty 8.8

## 2022-07-25 MED ORDER — ENOXAPARIN SODIUM 40 MG/0.4ML IJ SOSY
40.0000 mg | PREFILLED_SYRINGE | INTRAMUSCULAR | Status: DC
  Administered 2022-07-25: 40 mg via SUBCUTANEOUS
  Filled 2022-07-25: qty 0.4

## 2022-07-25 MED ORDER — ACETAMINOPHEN 325 MG PO TABS
650.0000 mg | ORAL_TABLET | ORAL | Status: DC | PRN
  Administered 2022-07-25: 650 mg via ORAL
  Filled 2022-07-25: qty 2

## 2022-07-25 MED ORDER — LORAZEPAM 2 MG/ML PO CONC: 1.0000 mg | ORAL | Status: DC | PRN

## 2022-07-25 MED ORDER — TAMSULOSIN HCL 0.4 MG PO CAPS
0.4000 mg | ORAL_CAPSULE | Freq: Two times a day (BID) | ORAL | Status: DC
  Administered 2022-07-25 – 2022-07-26 (×3): 0.4 mg via ORAL
  Filled 2022-07-25 (×3): qty 1

## 2022-07-25 MED ORDER — ONDANSETRON 4 MG PO TBDP: 4.0000 mg | ORAL_TABLET | Freq: Four times a day (QID) | ORAL | Status: DC | PRN

## 2022-07-25 MED ORDER — LORAZEPAM 1 MG PO TABS: 1.0000 mg | ORAL_TABLET | ORAL | Status: DC | PRN

## 2022-07-25 MED ORDER — FENTANYL CITRATE PF 50 MCG/ML IJ SOSY
25.0000 ug | PREFILLED_SYRINGE | INTRAMUSCULAR | Status: DC | PRN
  Administered 2022-07-25 (×2): 25 ug via INTRAVENOUS
  Filled 2022-07-25 (×3): qty 1

## 2022-07-25 MED ORDER — ALBUTEROL SULFATE (2.5 MG/3ML) 0.083% IN NEBU
2.5000 mg | INHALATION_SOLUTION | RESPIRATORY_TRACT | Status: DC | PRN
Start: 2022-07-25 — End: 2022-07-26

## 2022-07-25 MED ORDER — ACETAMINOPHEN 650 MG RE SUPP: 650.0000 mg | RECTAL | Status: DC | PRN

## 2022-07-25 MED ORDER — FENTANYL 25 MCG/HR TD PT72
1.0000 | MEDICATED_PATCH | TRANSDERMAL | Status: DC
  Administered 2022-07-25: 1 via TRANSDERMAL
  Filled 2022-07-25: qty 1

## 2022-07-25 NOTE — Consult Note (Signed)
Consultation Note Date: 07/25/2022   Patient Name: Donald Ellis  DOB: [birth year]  MRN: [id number]  Age / Sex: 88 y.o., male  PCP: Christain Sacramento, MD Referring Physician: Jonetta Osgood, MD  Reason for Consultation: Establishing goals of care  HPI/Patient Profile: 88 y.o. male  with past medical history of metastatic prostate cancer, GERD, emphysema of the lung admitted on 07/24/2022 with headache and worsening generalized pain.   In the ER work-up showed ischemia/right cerebellar stroke and patient was transferred from [facility] to [facility].  Patient is actively enrolled in [facility] [place].  PMT has been consulted to assist with goals of care conversation.  Clinical Assessment and Goals of Care:  I have reviewed medical records including EPIC notes, labs and imaging, received report from RN, assessed the patient and then had a phone conversation with patient's daughter Judeen Hammans to discuss diagnosis prognosis, [place], EOL wishes, disposition and options.  I introduced Palliative Medicine as specialized medical care for people living with serious illness. It focuses on providing relief from the symptoms and stress of a serious illness. The goal is to improve quality of life for both the patient and the family.  We discussed a brief life review of the patient and then focused on their current illness.  The natural disease trajectory and expectations at EOL were discussed.  I attempted to elicit values and goals of care important to the patient.    Medical History Review and Understanding:  Attempted to explore patient's understanding of his medical illness, he tells me that he is in a lot of pain and there is something going on in his head that is causing his headache.  During my conversation with his daughter, we discussed his prostate cancer with metastases and expected prognosis.  She has a good  understanding of the severity of his illness.  Social History: Patient is widowed (his wife died 73 months ago after 5 years of marriage).  He has 2 sons and 2 daughters, 46 grandchildren and several great-grandchildren.  He has lived with his daughter at her home since his last hospitalization.  His daughter tells me he enjoys getting out, going to church and spending time with loved ones.  He is very spiritual.  Functional and Nutritional State: Judeen Hammans tells me that patient has a great appetite and she has not noticed any major changes to his functionality recently.  Palliative Symptoms: Pain  Advance Directives: A detailed discussion regarding advanced directives was had.    Code Status: Concepts specific to code status, artifical feeding and hydration, and rehospitalization were considered and discussed.  DNR confirmed.  Discussion: Nichalas tells me he feels terrible and is in constant pain.  He does not understand why he has been in the emergency department for so long and this has not been adequately addressed.  Explored what matters to him most and he tells me getting his pain controlled and spending time with his family.  He wants to return to his daughter's house and he is at peace with his mortality, stating that he wants to "go home to his wife" who has recently died.  I then called his daughter Judeen Hammans to continue the conversation.  We discussed his pain management regimen and she clarifies that he is only taking ibuprofen and Tylenol at home.  He was prescribed 10 mg oxycodone as needed after his last hospitalization, however when he tried this he apparently had an episode where he "could not move anything and was paralyzed  screaming in pain" so he stopped taking it.  She is open to trying anything that would be effective and we discussed the fentanyl patch that was started by hospitalist as well as IV fentanyl.  Counseled on the process of continuing to adjust his medication for  adequate control.  Comfort care was reviewed in detail.  Judeen Hammans tells me this is aligned with patient's goals of care and that she would be able to come pick him up when his pain control regimen is finalized.  She would like to make sure he has these medications before discharge.   The difference between aggressive medical intervention and comfort care was considered in light of the patient's goals of care.   Discussed the importance of continued conversation with family and the medical providers regarding overall plan of care and treatment options, ensuring decisions are within the context of the patient's values and GOCs.   Questions and concerns were addressed.  Hard Choices booklet left for review. The family was encouraged to call with questions or concerns.  PMT will continue to support holistically.  SUMMARY OF RECOMMENDATIONS   -DNR confirmed -Transition to full comfort care Schedule Roxanol Q4H IV Morphine q2h PRN for pain/air hunger/comfort  Schedule ibuprofen 800 mg every 8 hours Ativan PRN for agitation/anxiety Zofran PRN for nausea Liquifilm tears PRN for dry eyes May have comfort feeding Unrestricted visitations in the setting of EOL (per policy) Oxygen PRN 2L or less for comfort. No escalation.   -Goals for return back home with [facility] after pain is well controlled -Psychosocial emotional support provided -PMT will continue to follow and support   Prognosis:  < 6 months  Discharge Planning: Home with Hospice      Primary Diagnoses: Present on Admission:  CVA (cerebral vascular accident) [facility])  Prostate cancer metastatic to pelvis ([place])  Emphysema lung ([place])  Stroke [facility])   Physical Exam Vitals and nursing note reviewed.  Constitutional:      General: He is not in acute distress.    Appearance: He is ill-appearing.  Cardiovascular:     Rate and Rhythm: Normal rate.  Pulmonary:     Effort: Pulmonary effort is normal.  Skin:    General:  Skin is warm and dry.  Neurological:     Mental Status: He is alert.     Vital Signs: BP (!) 151/72   Pulse 86   Temp 98.1 F (36.7 C) (Oral)   Resp (!) 26   Ht '5\' 8"'$  (1.727 m)   Wt 72.6 kg   SpO2 96%   BMI 24.33 kg/m  Pain Scale: 0-10   Pain Score: 10-Worst pain ever   SpO2: SpO2: 96 % O2 Device:SpO2: 96 % O2 Flow Rate: .    Palliative Assessment/Data:    MDM: High   Lambert Jeanty Johnnette Litter, PA-C  Palliative Medicine Team Team phone # [phone]  Thank you for allowing the Palliative Medicine Team to assist in the care of this patient. Please utilize secure chat with additional questions, if there is no response within 30 minutes please call the above phone number.  Palliative Medicine Team providers are available by phone from 7am to 7pm daily and can be reached through the team cell phone.  Should this patient require assistance outside of these hours, please call the patient's attending physician.

## 2022-07-25 NOTE — ED Notes (Signed)
Sitting up in the bed eating

## 2022-07-25 NOTE — ED Notes (Signed)
Patient transported to MRI 

## 2022-07-25 NOTE — Plan of Care (Signed)

## 2022-07-25 NOTE — ED Notes (Signed)
ED Provider at bedside. 

## 2022-07-25 NOTE — Progress Notes (Addendum)
PROGRESS NOTE        PATIENT DETAILS Name: Donald Ellis Age: 88 y.o. Sex: male Date of Birth: 09-08-1934 Admit Date: 07/24/2022 Admitting Physician Rise Patience, MD MAU:[id number], Jama Flavors, MD  Brief Summary: Patient is a 88 y.o.  male with history of metastatic prostate cancer-who presented to [facility] ED with headache and worsening generalized pain-neuroimaging was concerning for possible cerebellar stroke-he was transferred to [facility] for MRI brain and neuro evaluation.  See below for further details.  Significant events: 8/16>> worse generalized pain related to diffuse metastatic prostate cancer-headache-found to have acute CVA.  Admitted to [facility].  Significant studies: 8/16>> CT head: New hypodensity in the right cerebral hemisphere-infarct versus metastatic disease.  Sclerotic metastatic lesions at the skull base. 8/16>> MRI brain: Multifocal acute/subacute ischemia in the right cerebellum.  Significant microbiology data: None  Procedures: None  Consults: None  Subjective: Continues to complain of ongoing headaches and diffuse generalized pains.  Claims he has chronic right lower extremity weakness for the past several years due to arthritis.  Objective: Vitals: Blood pressure (!) 130/96, pulse 92, temperature 98.2 F (36.8 C), temperature source Oral, resp. rate (!) 21, height '5\' 8"'$  (1.727 m), weight 72.6 kg, SpO2 98 %.   Exam: Gen Exam:Alert awake-not in any distress HEENT:atraumatic, normocephalic Chest: B/L clear to auscultation anteriorly CVS:S1S2 regular Abdomen:soft non tender, non distended Extremities:no edema Neurology: Chronic RLE weakness is unchanged per patient-rest of the exam is nonfocal. Skin: no rash  Pertinent Labs/Radiology:    Latest Ref Rng & Units 07/25/2022    6:44 AM 07/24/2022    6:32 PM 06/24/2022    9:17 AM  CBC  WBC 4.0 - 10.5 K/uL 7.1  8.3  8.2   Hemoglobin 13.0 - 17.0 g/dL 10.6  10.6  13.0   Hematocrit 39.0 -  52.0 % 32.6  32.9  39.0   Platelets 150 - 400 K/uL 144  149  169     Lab Results  Component Value Date   NA 135 07/24/2022   K 4.4 07/24/2022   CL 107 07/24/2022   CO2 24 07/24/2022      Assessment/Plan: Acute cerebellar CVA: Continues to have headache-has chronic RLE weakness due to pain but otherwise no major issues.  Per admitting MD note-family in discussion with to pursue further work-up.  Tried calling patient's daughter Judeen Hammans this morning-unable to reach her.  Will await input from family before pursuing further work-up.  Metastatic prostate cancer with generalized pain: Apparently has had worsening pain for the past several months-already on short acting narcotics-we will place the patient on low-dose transdermal fentanyl patch-and short acting medications as needed.  I have consulted palliative care for symptom management and goals of care conversation.  GERD: On PPI  Normocytic anemia: Likely related to underlying malignancy.  Follow CBC.  COPD: Not in exacerbation-continue bronchodilators.  Addendum: Informed by palliative care that family has decided to transition to comfort measures.  We will need optimization of pain control before discharge-no further stroke work-up planned at this point.  BMI: Estimated body mass index is 24.33 kg/m as calculated from the following:   Height as of this encounter: '5\' 8"'$  (1.727 m).   Weight as of this encounter: 72.6 kg.   Code status:   Code Status: DNR   DVT Prophylaxis: enoxaparin (LOVENOX) injection 40 mg Start: 07/25/22 0800  Family Communication: Daughter-[phone]-called on 8/17-unable to reach.   Disposition Plan: Status is: Observation The patient will require care spanning > 2 midnights and should be moved to inpatient because: Acute CVA-lives alone-needs mobilization before consideration of discharge to see if he is safe for discharge-awaiting word from family before commencing further stroke work-up.   Palliative care evaluation pending as well.   Planned Discharge Destination:Home health   Diet: Diet Order             Diet Heart Room service appropriate? Yes; Fluid consistency: Thin  Diet effective now                     Antimicrobial agents: Anti-infectives (From admission, onward)    None        MEDICATIONS: Scheduled Meds:  [START ON 07/26/2022]  stroke: early stages of recovery book   Does not apply Once   enoxaparin (LOVENOX) injection  40 mg Subcutaneous Q24H   mometasone-formoterol  2 puff Inhalation BID   pantoprazole  40 mg Oral Daily   senna-docusate  1 tablet Oral BID   tamsulosin  0.4 mg Oral BID   Continuous Infusions: PRN Meds:.acetaminophen **OR** acetaminophen (TYLENOL) oral liquid 160 mg/5 mL **OR** acetaminophen, albuterol, fentaNYL (SUBLIMAZE) injection   I have personally reviewed following labs and imaging studies  LABORATORY DATA: CBC: Recent Labs  Lab 07/24/22 1832 07/25/22 0644  WBC 8.3 7.1  NEUTROABS 4.9  --   HGB 10.6* 10.6*  HCT 32.9* 32.6*  MCV 99.1 99.1  PLT 149* 144*    Basic Metabolic Panel: Recent Labs  Lab 07/24/22 1832 07/25/22 0644  NA 135  --   K 4.4  --   CL 107  --   CO2 24  --   GLUCOSE 102*  --   BUN 26*  --   CREATININE 0.68 0.51*  CALCIUM 7.8*  --     GFR: Estimated Creatinine Clearance: 64.1 mL/min (A) (by C-G formula based on SCr of 0.51 mg/dL (L)).  Liver Function Tests: Recent Labs  Lab 07/24/22 1832  AST 80*  ALT 14  ALKPHOS 1,689*  BILITOT 0.6  PROT 7.3  ALBUMIN 3.5   No results for input(s): "LIPASE", "AMYLASE" in the last 168 hours. No results for input(s): "AMMONIA" in the last 168 hours.  Coagulation Profile: Recent Labs  Lab 07/24/22 1832  INR 1.2    Cardiac Enzymes: No results for input(s): "CKTOTAL", "CKMB", "CKMBINDEX", "TROPONINI" in the last 168 hours.  BNP (last 3 results) No results for input(s): "PROBNP" in the last 8760 hours.  Lipid Profile: No  results for input(s): "CHOL", "HDL", "LDLCALC", "TRIG", "CHOLHDL", "LDLDIRECT" in the last 72 hours.  Thyroid Function Tests: No results for input(s): "TSH", "T4TOTAL", "FREET4", "T3FREE", "THYROIDAB" in the last 72 hours.  Anemia Panel: No results for input(s): "VITAMINB12", "FOLATE", "FERRITIN", "TIBC", "IRON", "RETICCTPCT" in the last 72 hours.  Urine analysis:    Component Value Date/Time   COLORURINE YELLOW 07/24/2022 1825   APPEARANCEUR CLEAR 07/24/2022 1825   LABSPEC 1.016 07/24/2022 1825   PHURINE 6.0 07/24/2022 1825   GLUCOSEU NEGATIVE 07/24/2022 1825   HGBUR NEGATIVE 07/24/2022 1825   BILIRUBINUR NEGATIVE 07/24/2022 1825   KETONESUR NEGATIVE 07/24/2022 1825   PROTEINUR NEGATIVE 07/24/2022 1825   NITRITE NEGATIVE 07/24/2022 1825   LEUKOCYTESUR NEGATIVE 07/24/2022 1825    Sepsis Labs: Lactic Acid, Venous    Component Value Date/Time   LATICACIDVEN 1.0 07/24/2022 2010    MICROBIOLOGY: No results found for  this or any previous visit (from the past 240 hour(s)).  RADIOLOGY STUDIES/RESULTS: MR BRAIN WO CONTRAST  Result Date: 07/25/2022 CLINICAL DATA:  Abnormal head CT EXAM: MRI HEAD WITHOUT CONTRAST TECHNIQUE: Multiplanar, multiecho pulse sequences of the brain and surrounding structures were obtained without intravenous contrast. COMPARISON:  Head CT 07/24/2022 FINDINGS: Brain: Multifocal acute/subacute ischemia in the right cerebellum. No acute or chronic hemorrhage. There is multifocal hyperintense T2-weighted signal within the white matter. Parenchymal volume and CSF spaces are normal. The midline structures are normal. Vascular: Major flow voids are preserved. Skull and upper cervical spine: Normal calvarium and skull base. Visualized upper cervical spine and soft tissues are normal. Sinuses/Orbits:No paranasal sinus fluid levels or advanced mucosal thickening. No mastoid or middle ear effusion. Normal orbits. IMPRESSION: Multifocal acute/subacute ischemia in the right  cerebellum. No hemorrhage or mass effect. Electronically Signed   By: Ulyses Jarred M.D.   On: 07/25/2022 02:12   CT Head Wo Contrast  Result Date: 07/24/2022 CLINICAL DATA:  Headache, new or worsening.  History of cancer. EXAM: CT HEAD WITHOUT CONTRAST TECHNIQUE: Contiguous axial images were obtained from the base of the skull through the vertex without intravenous contrast. RADIATION DOSE REDUCTION: This exam was performed according to the departmental dose-optimization program which includes automated exposure control, adjustment of the mA and/or kV according to patient size and/or use of iterative reconstruction technique. COMPARISON:  08/30/2021. FINDINGS: Brain: No acute intracranial hemorrhage, midline shift or mass effect. No extra-axial fluid collection. Generalized atrophy is noted. Periventricular white matter hypodensities are present bilaterally. No hydrocephalus. There is a new hypodensity in the right cerebellar hemisphere measuring 1.2 cm. Vascular: No hyperdense vessel or unexpected calcification. Skull: No acute fracture. Scattered sclerotic lesions are noted at the skull base, previously characterized as metastatic disease. Sinuses/Orbits: Mucosal thickening is present in the ethmoid air cells and maxillary sinuses bilaterally and left sphenoid sinus. No acute orbital abnormality. Other: None. IMPRESSION: 1. No acute intracranial hemorrhage. 2. New hypodensity in the right cerebellar hemisphere. Differential diagnosis includes infarct versus metastatic disease given history of cancer. MRI is recommended for further evaluation. 3. Atrophy with chronic microvascular ischemic changes 4. Sclerotic metastatic lesions at the skull base, unchanged. Electronically Signed   By: Brett Fairy M.D.   On: 07/24/2022 21:57   DG Chest 2 View  Result Date: 07/24/2022 CLINICAL DATA:  Generalized body aches. EXAM: CHEST - 2 VIEW COMPARISON:  June 24, 2022 FINDINGS: The heart size and mediastinal contours  are within normal limits. There is moderate severity calcification of the aortic arch. Diffuse, chronic appearing increased lung markings are seen. There is no evidence of acute infiltrate, pleural effusion or pneumothorax. There is a chronic fracture of the mid left clavicle. Diffuse sclerotic osseous metastasis are seen. IMPRESSION: Chronic appearing increased lung markings without evidence of acute or active cardiopulmonary disease. Electronically Signed   By: Virgina Norfolk M.D.   On: 07/24/2022 18:56     LOS: 0 days   Oren Binet, MD  [facility]    To contact the attending provider between 7A-7P or the covering provider during after hours 7P-7A, please log into the web site [email or web address] and access using universal [place] password for that web site. If you do not have the password, please call the hospital operator.  07/25/2022, 10:15 AM

## 2022-07-25 NOTE — H&P (Signed)
History and Physical    Donald Ellis KCM:[id number] DOB: 1934-03-27 DOA: 07/24/2022  PCP: Christain Sacramento, MD  Patient coming from: Home.  Chief Complaint: Generalized pain.  HPI: Donald Ellis is a 88 y.o. male with history of metastatic prostate cancer who was recently admitted to the hospital last month for respiratory failure presents to the ER with complaints of worsening pain and also headache.  Denies any focal deficits or any new weakness except for that patient is generally weak.  ED Course: In the ER CT head was showing abdominal T and was transferred to [place] from [place] to get MRI brain which confirms cerebellar stroke.  Patient also having significant pain for which patient was given 2 dose of IV fentanyl.  At this time patient's daughter is not sure if they want to pursue further stroke work-up until they discussed with all the family members.  Patient admitted for further observation and also pain control.  Review of Systems: As per HPI, rest all negative.   Past Medical History:  Diagnosis Date   Emphysema lung ([place]) 06/24/2022   Foley catheter in place    LAST CHANGED 07-04-2001   GERD (gastroesophageal reflux disease) 06/24/2022   HOH (hard of hearing)    Urinary retention     Past Surgical History:  Procedure Laterality Date   BIOPSY  03/12/2021   Procedure: BIOPSY;  Surgeon: Doran Stabler, MD;  Location: [place];  Service: Gastroenterology;;   CYSTOSCOPY WITH INSERTION OF [place] N/A 07/01/2019   Procedure: CYSTOSCOPY WITH INSERTION OF UROLIFT;  Surgeon: Cleon Gustin, MD;  Location: [facility];  Service: Urology;  Laterality: N/A;   ESOPHAGOGASTRODUODENOSCOPY (EGD) WITH PROPOFOL N/A 03/12/2021   Procedure: ESOPHAGOGASTRODUODENOSCOPY (EGD) WITH PROPOFOL;  Surgeon: Doran Stabler, MD;  Location: [place];  Service: Gastroenterology;  Laterality: N/A;   HOT HEMOSTASIS N/A 03/12/2021   Procedure: HOT HEMOSTASIS (ARGON PLASMA  COAGULATION/BICAP);  Surgeon: Doran Stabler, MD;  Location: [place];  Service: Gastroenterology;  Laterality: N/A;   SCLEROTHERAPY  03/12/2021   Procedure: SCLEROTHERAPY;  Surgeon: Doran Stabler, MD;  Location: [place];  Service: Gastroenterology;;   TRANSURETHRAL RESECTION OF PROSTATE N/A 08/30/2019   Procedure: TRANSURETHRAL RESECTION OF THE PROSTATE (TURP);  Surgeon: Cleon Gustin, MD;  Location: [facility];  Service: Urology;  Laterality: N/A;  2 HRS     reports that he has quit smoking. He has never used smokeless tobacco. He reports that he does not currently use alcohol. He reports that he does not use drugs.  Allergies  Allergen Reactions   Hydrocodone-Acetaminophen Itching    Itching and tingling without a rash    Family History  Problem Relation Age of Onset   GI Bleed Neg Hx     Prior to Admission medications   Medication Sig Start Date End Date Taking? Authorizing Provider  acetaminophen (TYLENOL) 650 MG CR tablet Take 650 mg by mouth every 8 (eight) hours as needed for pain.   Yes [provider]  albuterol (VENTOLIN HFA) 108 (90 Base) MCG/ACT inhaler Inhale 2 puffs into the lungs every 4 (four) hours as needed for wheezing or shortness of breath. 02/26/21  Yes [provider]  Cholecalciferol (VITAMIN D3) 125 MCG (5000 UT) TABS Take 5,000 Units by mouth daily.   Yes [provider]  diphenhydramine-acetaminophen (TYLENOL PM) 25-500 MG TABS tablet Take 1 tablet by mouth at bedtime.   Yes [provider]  ibuprofen (ADVIL) 800 MG tablet Take 800 mg by mouth 3 (three) times daily. 02/21/22  Yes [provider]  pantoprazole (PROTONIX) 40 MG tablet TAKE 1 TABLET BY MOUTH TWICE A DAY Patient taking differently: Take 40 mg by mouth daily. 07/23/21  Yes Danis, Kirke Corin, MD  SYMBICORT 160-4.5 MCG/ACT inhaler Inhale 1 puff into the lungs 2 (two) times daily. 01/24/21  Yes [provider]   tamsulosin (FLOMAX) 0.4 MG CAPS capsule Take 0.4 mg by mouth 2 (two) times daily. 09/21/20  Yes [provider]  traMADol (ULTRAM) 50 MG tablet Take 50 mg by mouth every 6 (six) hours as needed for moderate pain. 08/21/21  Yes [provider]  Oxycodone HCl 10 MG TABS Take 10 mg by mouth every 6 (six) hours as needed. Patient not taking: Reported on 07/24/2022 06/20/22   [provider]    Physical Exam: Constitutional: Moderately built and nourished. Vitals:   07/25/22 0415 07/25/22 0500 07/25/22 0600 07/25/22 0615  BP: (!) 154/104 (!) 154/76 (!) 154/82 (!) 156/80  Pulse: 86 85 95 95  Resp: (!) 25 (!) 25 (!) 29 (!) 31  Temp:      TempSrc:      SpO2: 93% 97% 96% 96%  Weight:      Height:       Eyes: Anicteric no pallor. ENMT: No discharge from the ears eyes nose and mouth. Neck: No mass felt.  No neck rigidity. Respiratory: No rhonchi or crepitations. Cardiovascular: S1-S2 heard. Abdomen: Nontender bowel sound present. Musculoskeletal: No edema. Skin: No rash. Neurologic: Alert awake oriented to his name and place moving all extremities 5 x 5 no facial asymmetry tongue is midline patient is hard of hearing. Psychiatric: Alert to place and person.   Labs on Admission: I have personally reviewed following labs and imaging studies  CBC: Recent Labs  Lab 07/24/22 1832  WBC 8.3  NEUTROABS 4.9  HGB 10.6*  HCT 32.9*  MCV 99.1  PLT 122*   Basic Metabolic Panel: Recent Labs  Lab 07/24/22 1832  NA 135  K 4.4  CL 107  CO2 24  GLUCOSE 102*  BUN 26*  CREATININE 0.68  CALCIUM 7.8*   GFR: Estimated Creatinine Clearance: 64.1 mL/min (by C-G formula based on SCr of 0.68 mg/dL). Liver Function Tests: Recent Labs  Lab 07/24/22 1832  AST 80*  ALT 14  ALKPHOS 1,689*  BILITOT 0.6  PROT 7.3  ALBUMIN 3.5   No results for input(s): "LIPASE", "AMYLASE" in the last 168 hours. No results for input(s): "AMMONIA" in the last 168 hours. Coagulation  Profile: Recent Labs  Lab 07/24/22 1832  INR 1.2   Cardiac Enzymes: No results for input(s): "CKTOTAL", "CKMB", "CKMBINDEX", "TROPONINI" in the last 168 hours. BNP (last 3 results) No results for input(s): "PROBNP" in the last 8760 hours. HbA1C: No results for input(s): "HGBA1C" in the last 72 hours. CBG: No results for input(s): "GLUCAP" in the last 168 hours. Lipid Profile: No results for input(s): "CHOL", "HDL", "LDLCALC", "TRIG", "CHOLHDL", "LDLDIRECT" in the last 72 hours. Thyroid Function Tests: No results for input(s): "TSH", "T4TOTAL", "FREET4", "T3FREE", "THYROIDAB" in the last 72 hours. Anemia Panel: No results for input(s): "VITAMINB12", "FOLATE", "FERRITIN", "TIBC", "IRON", "RETICCTPCT" in the last 72 hours. Urine analysis:    Component Value Date/Time   COLORURINE YELLOW 07/24/2022 1825   APPEARANCEUR CLEAR 07/24/2022 1825   LABSPEC 1.016 07/24/2022 1825   PHURINE 6.0 07/24/2022 1825   GLUCOSEU NEGATIVE 07/24/2022 1825  HGBUR NEGATIVE 07/24/2022 1825   BILIRUBINUR NEGATIVE 07/24/2022 1825   KETONESUR NEGATIVE 07/24/2022 1825   PROTEINUR NEGATIVE 07/24/2022 1825   NITRITE NEGATIVE 07/24/2022 1825   LEUKOCYTESUR NEGATIVE 07/24/2022 1825   Sepsis Labs: '@LABRCNTIP'$ (procalcitonin:4,lacticidven:4) )No results found for this or any previous visit (from the past 240 hour(s)).   Radiological Exams on Admission: MR BRAIN WO CONTRAST  Result Date: 07/25/2022 CLINICAL DATA:  Abnormal head CT EXAM: MRI HEAD WITHOUT CONTRAST TECHNIQUE: Multiplanar, multiecho pulse sequences of the brain and surrounding structures were obtained without intravenous contrast. COMPARISON:  Head CT 07/24/2022 FINDINGS: Brain: Multifocal acute/subacute ischemia in the right cerebellum. No acute or chronic hemorrhage. There is multifocal hyperintense T2-weighted signal within the white matter. Parenchymal volume and CSF spaces are normal. The midline structures are normal. Vascular: Major flow voids  are preserved. Skull and upper cervical spine: Normal calvarium and skull base. Visualized upper cervical spine and soft tissues are normal. Sinuses/Orbits:No paranasal sinus fluid levels or advanced mucosal thickening. No mastoid or middle ear effusion. Normal orbits. IMPRESSION: Multifocal acute/subacute ischemia in the right cerebellum. No hemorrhage or mass effect. Electronically Signed   By: Ulyses Jarred M.D.   On: 07/25/2022 02:12   CT Head Wo Contrast  Result Date: 07/24/2022 CLINICAL DATA:  Headache, new or worsening.  History of cancer. EXAM: CT HEAD WITHOUT CONTRAST TECHNIQUE: Contiguous axial images were obtained from the base of the skull through the vertex without intravenous contrast. RADIATION DOSE REDUCTION: This exam was performed according to the departmental dose-optimization program which includes automated exposure control, adjustment of the mA and/or kV according to patient size and/or use of iterative reconstruction technique. COMPARISON:  08/30/2021. FINDINGS: Brain: No acute intracranial hemorrhage, midline shift or mass effect. No extra-axial fluid collection. Generalized atrophy is noted. Periventricular white matter hypodensities are present bilaterally. No hydrocephalus. There is a new hypodensity in the right cerebellar hemisphere measuring 1.2 cm. Vascular: No hyperdense vessel or unexpected calcification. Skull: No acute fracture. Scattered sclerotic lesions are noted at the skull base, previously characterized as metastatic disease. Sinuses/Orbits: Mucosal thickening is present in the ethmoid air cells and maxillary sinuses bilaterally and left sphenoid sinus. No acute orbital abnormality. Other: None. IMPRESSION: 1. No acute intracranial hemorrhage. 2. New hypodensity in the right cerebellar hemisphere. Differential diagnosis includes infarct versus metastatic disease given history of cancer. MRI is recommended for further evaluation. 3. Atrophy with chronic microvascular  ischemic changes 4. Sclerotic metastatic lesions at the skull base, unchanged. Electronically Signed   By: Brett Fairy M.D.   On: 07/24/2022 21:57   DG Chest 2 View  Result Date: 07/24/2022 CLINICAL DATA:  Generalized body aches. EXAM: CHEST - 2 VIEW COMPARISON:  June 24, 2022 FINDINGS: The heart size and mediastinal contours are within normal limits. There is moderate severity calcification of the aortic arch. Diffuse, chronic appearing increased lung markings are seen. There is no evidence of acute infiltrate, pleural effusion or pneumothorax. There is a chronic fracture of the mid left clavicle. Diffuse sclerotic osseous metastasis are seen. IMPRESSION: Chronic appearing increased lung markings without evidence of acute or active cardiopulmonary disease. Electronically Signed   By: Virgina Norfolk M.D.   On: 07/24/2022 18:56      Assessment/Plan Principal Problem:   CVA (cerebral vascular accident) [facility]) Active Problems:   Prostate cancer metastatic to pelvis ([place])   Emphysema lung ([place])   Stroke ([place])    Acute cerebellar CVA -discussed with Ms. Blanch Media patient's daughter.  Patient's daughter states that they are  not sure at this time if they want to pursue further stroke work-up and they will be discussing with family and come back with a decision soon.  For now we will keep patient on neurochecks and aspirin physical therapy consult. Worsening pain secondary to metastatic prostate cancer.  Patient received fentanyl IV following which pain has improved we will keep patient on as needed fentanyl IV for now and may need Duragesic patch if pain improves.  We will keep patient on senna. History of GERD on PPI. Anemia appears to be chronic follow CBC.   DVT prophylaxis: Lovenox. Code Status: DNR. Family Communication: Patient's daughter. Disposition Plan: Home. Consults called: Palliative care. Admission status: The patient.   Rise Patience MD [facility] Pager [phone].  If 7PM-7AM, please contact night-coverage [email or web address] Password [facility]  07/25/2022, 6:23 AM

## 2022-07-25 NOTE — ED Notes (Signed)
Spoke with pt daughter to update the daughter on her father with father permission to update.

## 2022-07-25 NOTE — ED Notes (Signed)
Admitting provider at bedside.

## 2022-07-25 NOTE — ED Provider Notes (Signed)
Patient was received in transit from [facility] emergency department from Isla Pence MD please see note for full detail  Patient with medical history including metastatic prostate cancer status post chemotherapy presents with complaints of generalized weakness, has been an ongoing issue, endorses urinary frequency but this is also chronic as well, he has no other complaints.  Spoke with patient's son who is at bedside he endorses that patient has been feeling unwell since he got his last shot on Thursday, states that since then he is complaining for body aches and a headache, but states that mainly is complaining of pain on the right side, son states that he normally walks with a walker he has not noticed any worsening weakness or off balance.  Patient was worked up at [employer], CT head revealed new hypodensity in the right cerebral hemisphere concerning for infarct versus metastatic disease recommend MRI is recommend that we follow-up on MRI.   Physical Exam  BP (!) 158/80   Pulse 87   Temp 98.1 F (36.7 C) (Oral)   Resp (!) 23   Ht _0  (1.727 m)   Wt 72.6 kg   SpO2 98%   BMI 24.33 kg/m   Physical Exam Vitals and nursing note reviewed.  Constitutional:      General: He is not in acute distress.    Appearance: He is not ill-appearing.  HENT:     Head: Normocephalic and atraumatic.     Nose: No congestion.  Eyes:     Conjunctiva/sclera: Conjunctivae normal.  Cardiovascular:     Rate and Rhythm: Normal rate and regular rhythm.     Pulses: Normal pulses.     Heart sounds: No murmur heard.    No friction rub. No gallop.  Pulmonary:     Effort: Pulmonary effort is normal.  Skin:    General: Skin is warm and dry.  Neurological:     Mental Status: He is alert.     Comments: Cranial nerves II through XII grossly intact no difficulty with word finding following two-step commands, he is noted right leg weakness at the hip flexor but this secondary to pain, he has 5-5 strength  at his feet and knees bilaterally, 5-5 strength in the upper extremities.  Psychiatric:        Mood and Affect: Mood normal.     Procedures  Procedures  ED Course / MDM   Clinical Course as of 07/25/22 0537  Wed Jul 24, 2022  2103 I discussed this case with my attending physician who cosigned this note including patient's presenting symptoms, physical exam, and planned diagnostics and interventions. Attending physician stated agreement with plan or made changes to plan which were implemented.   Attending physician assessed patient at bedside.   [CF]  2225 I spoke with Dr. Maryan Rued ED physician at [facility] who accepts transfer. [CF]  2228 CBC with Differential(!) Mild anemia comparison to previous.  No evidence of leukocytosis. [CF]  2228 Urinalysis, Routine w reflex microscopic Urine, Clean Catch Normal. [CF]  2228 Comprehensive metabolic panel(!) Elevated BUN and alk phos which seems to be at the patient's baseline. [CF]  2228 Protime-INR Normal. [CF]  2228 Lactic acid, plasma Normal. [CF]  2229 DG Chest 2 View There is evidence of metastatic disease.  I do agree with radiology interpretation.  I personally ordered and interpreted this image. [CF]  2229 CT Head Wo Contrast There is evidence of a new hypodensity in the setting of cancer could be metastatic disease  to the brain.  I do agree with the radiologist to rotation.  Patient is in need of MRI.  We do not have MRI at this time.  I will transfer the patient to [facility] for further evaluation.  I do agree with the radiologist interpretation. [CF]    Clinical Course User Index [CF] Hendricks Limes, PA-C   Medical Decision Making Amount and/or Complexity of Data Reviewed Labs: ordered. Decision-making details documented in ED Course. Radiology: ordered. Decision-making details documented in ED Course.  Risk Prescription drug management. Decision regarding hospitalization.    Co morbidities that complicate the patient  evaluation  Metastatic prostate cancer  Social Determinants of Health:  Geriatric    Lab Tests:  I Ordered, and personally interpreted labs.  The pertinent results include: CBC shows normocytic anemia hemoglobin of 10.6, CMP shows glucose of 102 BUN 26 AST 80 alk phos 1689 and 4, INR prothrombin time unremarkable lactic 1, UA is unremarkable   Imaging Studies ordered:  I ordered imaging studies including CT head, chest x-ray, MRI brain I independently visualized and interpreted imaging which showed chest x-ray unremarkable, CT head reveals hypodensity right hemisphere recommend MRI for further evaluation, MRI reveals acute/subacute infarcts I agree with the radiologist interpretation   Cardiac Monitoring:  The patient was maintained on a cardiac monitor.  I personally viewed and interpreted the cardiac monitored which showed an underlying rhythm of: N/A   Medicines ordered and prescription drug management:  I ordered medication including fentanyl for pain I have reviewed the patients home medicines and have made adjustments as needed  Critical Interventions:  N/A   Reevaluation: Presents from [facility] emergency department, he had a benign physical exam, will obtain MRI for further evaluation.  Updated family on imaging and recommendation from neurology, they are unclear what the patient would want, I had extensive phone conversation with both the son and the daughter and they both would like to speak with the patient this morning to determine if the patient would want further evaluation.    Consultations Obtained:  I requested consultation with the neurology Dr. Curly Shores ,  and discussed lab and imaging findings as well as pertinent plan - they recommend: Admit to medicine for stroke work-up, she  noted that patient recently was discharged on hospice recommends consulting with family to see what their wishes are Spoke with hospitalist Dr. Hal Hope as well as  neurologist Dr. Curly Shores about family's wishes, patient will be admitted to hospitalist team but will hold off on stroke work-up until definitive answer has been given by family, palliative care will also be consulted    Test Considered:  N/A    Rule out Low Suspicion for intracranial head bleed imaging is negative for this, low suspicion for meningitis is no meningeal sign present.  Low suspicion for systemic infection as patient nontoxic-appearing vital signs are reassuring.    Dispostion and problem list  After consideration of the diagnostic results and the patients response to treatment, I feel that the patent would benefit from admission.  CVA-patient has subacute stroke and is in the process pursuing hospice care.  patient will be admitted to medicine but stroke work-up will be withhold until family determines patient's wishes.  Palliative care will be consulted to help facilitate process.  Neurology will be reconsulted if further work-up of stroke is needed.           Marcello Fennel, PA-C 07/25/22 9937    Ripley Fraise, MD 07/25/22 [phone]

## 2022-07-25 NOTE — Progress Notes (Signed)
SLP Cancellation Note  Patient Details Name: Donald Ellis MRN: [id number] DOB: Oct 24, 1934   Cancelled treatment:       Reason Eval/Treat Not Completed: Patient at procedure or test/unavailable   Elvina Sidle, M.S., CCC-SLP 07/25/2022, 10:57 AM

## 2022-07-25 NOTE — ED Notes (Signed)
Help sit patient up to eat patient is resting with call bell in reach

## 2022-07-25 NOTE — Progress Notes (Signed)
Physical Therapy Evaluation and Discharge Patient Details Name: Hersey Maclellan MRN: [id number] DOB: 1934-04-25 Today's Date: 07/25/2022  History of Present Illness  88 yo male with onset of increasing pain and HA was admitted 8/16, note R cerebellar ischemia.  Pt has R knee pain chronically and very concerning to him.  Noted pelvic mets from prostate, has high pain complaints and anemia. PMHx:  prostate CA, emphysema, GERD, HOH, urinary retention, gastric ulcers  Clinical Impression  Pt was assessed and has signficiant difficulty with moving on [facility], requires two handed support of RW to safely walk a short way.  He is assessed by PT but MD has discontinued his order after the evaluation.  Will follow up with his medical services as ordered in [place].       Recommendations for follow up therapy are one component of a multi-disciplinary discharge planning process, led by the attending physician.  Recommendations may be updated based on patient status, additional functional criteria and insurance authorization.  Follow Up Recommendations No PT follow up      Assistance Recommended at Discharge Frequent or constant Supervision/Assistance  Patient can return home with the following  Two people to help with walking and/or transfers;A lot of help with bathing/dressing/bathroom;Direct supervision/assist for medications management;Direct supervision/assist for financial management;Assist for transportation;Help with stairs or ramp for entrance    Equipment Recommendations None recommended by PT  Recommendations for Other Services  Other (comment)    Functional Status Assessment Patient has had a recent decline in their functional status and demonstrates the ability to make significant improvements in function in a reasonable and predictable amount of time.     Precautions / Restrictions Precautions Precautions: Fall Precaution Comments: HOH Restrictions Weight Bearing Restrictions: No       Mobility  Bed Mobility Overal bed mobility: Needs Assistance Bed Mobility: Supine to Sit, Sit to Supine     Supine to sit: Min assist Sit to supine: Min assist        Transfers Overall transfer level: Needs assistance Equipment used: 1 person hand held assist, Straight cane Transfers: Sit to/from Stand Sit to Stand: Min assist           General transfer comment: min assist for safety as pt has been off his feet two days    Ambulation/Gait Ambulation/Gait assistance: Min assist Gait Distance (Feet): 15 Feet Assistive device: 1 person hand held assist, Straight cane Gait Pattern/deviations: Step-through pattern, Decreased stride length Gait velocity: reduced Gait velocity interpretation: <1.31 ft/sec, indicative of household ambulator Pre-gait activities: standing balance ck General Gait Details: walked with struggle as R knee is in pain  Stairs            Wheelchair Mobility    Modified Rankin (Stroke Patients Only) Modified Rankin (Stroke Patients Only) Pre-Morbid Rankin Score: No significant disability Modified Rankin: Moderately severe disability     Balance Overall balance assessment: Needs assistance Sitting-balance support: Feet supported Sitting balance-Leahy Scale: Fair     Standing balance support: Bilateral upper extremity supported, During functional activity Standing balance-Leahy Scale: Poor                               Pertinent Vitals/Pain Pain Assessment Pain Assessment: Faces Faces Pain Scale: Hurts little more Pain Location: R knee with any mobility Pain Descriptors / Indicators: Aching, Guarding Pain Intervention(s): Limited activity within patient's tolerance, Monitored during session, Premedicated before session, Repositioned    Home Living Family/patient  expects to be discharged to:: Private residence Living Arrangements: Children Available Help at Discharge: Family;Available PRN/intermittently Type of  Home: House Home Access: Stairs to enter   Entrance Stairs-Number of Steps: 2   Home Layout: One level Home Equipment: Other (comment);Cane - single point (Pt did not answer questions about equipment fully)      Prior Function Prior Level of Function : Independent/Modified Independent             Mobility Comments: per pt using SPC only       Hand Dominance   Dominant Hand: Right    Extremity/Trunk Assessment   Upper Extremity Assessment Upper Extremity Assessment: Overall WFL for tasks assessed    Lower Extremity Assessment Lower Extremity Assessment: Generalized weakness    Cervical / Trunk Assessment Cervical / Trunk Assessment: Kyphotic  Communication   Communication: HOH  Cognition Arousal/Alertness: Awake/alert Behavior During Therapy: WFL for tasks assessed/performed Overall Cognitive Status: No family/caregiver present to determine baseline cognitive functioning                                 General Comments: Pt may be baseline cognitively        General Comments General comments (skin integrity, edema, etc.): pt is struggling between LE weakness, balance changes on LLE and R knee pain to walk without BUE support    Exercises     Assessment/Plan    PT Assessment Patient needs continued PT services  PT Problem List Decreased strength;Decreased range of motion;Decreased activity tolerance;Decreased balance;Decreased mobility;Decreased coordination;Decreased knowledge of use of DME;Decreased safety awareness;Pain       PT Treatment Interventions      PT Goals (Current goals can be found in the Care Plan section)  Acute Rehab PT Goals Patient Stated Goal: none stated PT Goal Formulation: With patient (not completed as order is discontinued)    Frequency       Co-evaluation               AM-PAC PT "6 Clicks" Mobility  Outcome Measure Help needed turning from your back to your side while in a flat bed without using  bedrails?: A Lot Help needed moving from lying on your back to sitting on the side of a flat bed without using bedrails?: A Lot Help needed moving to and from a bed to a chair (including a wheelchair)?: A Lot Help needed standing up from a chair using your arms (e.g., wheelchair or bedside chair)?: A Lot Help needed to walk in hospital room?: A Lot Help needed climbing 3-5 steps with a railing? : Total 6 Click Score: 11    End of Session Equipment Utilized During Treatment: Gait belt Activity Tolerance: Patient limited by fatigue;Patient limited by pain;Treatment limited secondary to medical complications (Comment) Patient left: in bed;with call bell/phone within reach Nurse Communication: Mobility status PT Visit Diagnosis: Unsteadiness on feet (R26.81);Muscle weakness (generalized) (M62.81);Difficulty in walking, not elsewhere classified (R26.2);Pain Pain - Right/Left: Right Pain - part of body: Knee    Time: 1133-1205 PT Time Calculation (min) (ACUTE ONLY): 32 min   Charges:   PT Evaluation $PT Eval Moderate Complexity: 1 Mod PT Treatments $Therapeutic Activity: 8-22 mins       Ramond Dial 07/25/2022, 4:36 PM  Mee Hives, PT PhD Acute Rehab Dept. Number: [place]

## 2022-07-25 NOTE — ED Notes (Addendum)
Called in pt room for severe cramping in his right leg.  We were able to stretch it out pt is now in no pain will call again if he leg continues to cramp

## 2022-07-26 ENCOUNTER — Other Ambulatory Visit (HOSPITAL_COMMUNITY): Payer: Self-pay

## 2022-07-26 DIAGNOSIS — C7989 Secondary malignant neoplasm of other specified sites: Secondary | ICD-10-CM

## 2022-07-26 DIAGNOSIS — C61 Malignant neoplasm of prostate: Secondary | ICD-10-CM | POA: Diagnosis not present

## 2022-07-26 DIAGNOSIS — I639 Cerebral infarction, unspecified: Secondary | ICD-10-CM | POA: Diagnosis not present

## 2022-07-26 DIAGNOSIS — J431 Panlobular emphysema: Secondary | ICD-10-CM

## 2022-07-26 MED ORDER — MORPHINE SULFATE (CONCENTRATE) 10 MG/0.5ML PO SOLN
5.0000 mg | ORAL | 0 refills | Status: AC
  Filled 2022-07-26: qty 30, 20d supply, fill #0

## 2022-07-26 MED ORDER — POLYETHYLENE GLYCOL 3350 17 G PO PACK: 17.0000 g | PACK | Freq: Every day | ORAL | 0 refills | Status: AC

## 2022-07-26 MED ORDER — SENNOSIDES-DOCUSATE SODIUM 8.6-50 MG PO TABS
1.0000 | ORAL_TABLET | Freq: Two times a day (BID) | ORAL | 0 refills | Status: AC
  Filled 2022-07-26: qty 60, 30d supply, fill #0

## 2022-07-26 MED ORDER — FENTANYL 25 MCG/HR TD PT72
1.0000 | MEDICATED_PATCH | TRANSDERMAL | 0 refills | Status: DC
  Filled 2022-07-26: qty 3, 9d supply, fill #0

## 2022-07-26 NOTE — Progress Notes (Signed)
Daily Progress Note   Patient Name: Donald Ellis       Date: 07/26/2022 DOB: [birth year]  Age: 88 y.o. MRN#: [id number] Attending Physician: Jonetta Osgood, MD Primary Care Physician: Christain Sacramento, MD Admit Date: 07/24/2022  Reason for Consultation/Follow-up: Establishing goals of care  Subjective: Medical records reviewed including progress notes. Patient assessed at the bedside. Discussed with RN. No family present during my visit.  We discussed Donald Ellis's pain management since initiation of scheduled Roxanol and he tells me, "I had the best night of rest I've had in the past few months, why didn't they tell me about this 6 months ago." His pain is completely resolved and without side effects or any other concerns. He is eager to go home and would like to continue with the Roxanol with hospice managing moving forward.   Called patient's daughter Donald Ellis to provide updates but was unable to reach. Voicemail with PMT contact information was provided.  Questions and concerns addressed. PMT will continue to support holistically.   Length of Stay: 1   Physical Exam Vitals and nursing note reviewed.  Constitutional:      General: He is not in acute distress. Cardiovascular:     Rate and Rhythm: Normal rate.  Pulmonary:     Effort: Pulmonary effort is normal. No respiratory distress.  Skin:    General: Skin is warm and dry.  Neurological:     Mental Status: He is alert and oriented to person, place, and time. Mental status is at baseline.  Psychiatric:        Mood and Affect: Mood normal.             Vital Signs: BP (!) 155/62 (BP Location: Right Arm)   Pulse 90   Temp 98 F (36.7 C) (Oral)   Resp 16   Ht '5\' 9"'$  (1.753 m)   Wt 63.7 kg   SpO2 98%   BMI 20.74 kg/m  SpO2:  SpO2: 98 % O2 Device: O2 Device: Room Air O2 Flow Rate:        Palliative Assessment/Data:      Palliative Care Assessment & Plan   Patient Profile: 88 y.o. male  with past medical history of metastatic prostate cancer, GERD, emphysema of the lung admitted on 07/24/2022 with headache and worsening generalized pain.  In the ER work-up showed ischemia/right cerebellar stroke and patient was transferred from [facility] to [facility].  Patient is actively enrolled in [facility].  PMT has been consulted to assist with goals of care conversation.  Assessment: Goals of care conversation Acute cerebellar CVA Metastatic prostate cancer with generalized pain  Recommendations/Plan: Continue DNR Continue comfort care Patient is ready to discharge back home with hopsice; daughter would like to use [facility] pharmacy Psychosocial and emotional support provided PMT will continue to follow and support   Prognosis:  < 6 months  Discharge Planning: Home with Hospice  Care plan was discussed with patient, Dr. Sloan Leiter, [place], PA-C  Palliative Medicine Team Team phone # [phone]  Thank you for allowing the Palliative Medicine Team to assist in the care of this patient. Please utilize secure chat with additional questions, if there is no response within 30 minutes please call the above phone number.  Palliative Medicine Team providers are available by phone from 7am to 7pm daily and can be reached through the team cell phone.  Should this patient require assistance outside of these hours, please call the patient's attending physician.

## 2022-07-26 NOTE — Plan of Care (Signed)

## 2022-07-26 NOTE — TOC Transition Note (Signed)
Transition of Care [facility]) - CM/SW Discharge Note   Patient Details  Name: Littleton Haub MRN: [id number] Date of Birth: Apr 08, 1934  Transition of Care [facility] CM/SW Contact:  Curlene Labrum, RN Phone Number: 07/26/2022, 11:33 AM   Clinical Narrative:    CM met with the patient and daughter in the hospital room prior to discharge to home.  Discharge medications were called into the [place] and will be delivered the patient's room prior to discharge to home.  The patient's daughter states that she has all available DME at the home that she needs at this time - which include shower seat.  I called [facility] and made them aware that the patient will be discharged home with family to the daughter's address:  [address] [place], [place].  DNR completed by the physician.   Final next level of care: Home w Hospice Care Barriers to Discharge: No Barriers Identified   Patient Goals and CMS Choice Patient states their goals for this hospitalization and ongoing recovery are:: To return home with hospice care through [facility] Medicare.gov Compare Post Acute Care list provided to:: Patient Represenative (must comment) (daughter, Judeen Hammans) Choice offered to / list presented to : Patient  Discharge Placement                       Discharge Plan and Services   Discharge Planning Services: CM Consult                        [facility] Agency: [facility] Date [facility] Agency Contacted: 07/26/22 Time [place]: 1132 Representative spoke with at [place]: [facility] notified of patient's return to home with family today -  Social Determinants of Health (SDOH) Interventions     Readmission Risk Interventions     No data to display

## 2022-07-26 NOTE — Progress Notes (Signed)
OT Cancellation Note  Patient Details Name: Donald Ellis MRN: [id number] DOB: Dec 20, 1933   Cancelled Treatment:    Reason Eval/Treat Not Completed:  Pt on comfort measures, OT signing off.  Malka So 07/26/2022, 8:33 AM Cleta Alberts, OTR/L Acute Rehabilitation Services Office: [phone]

## 2022-07-26 NOTE — Discharge Summary (Addendum)
PATIENT DETAILS Name: Donald Ellis Age: 88 y.o. Sex: male Date of Birth: 1934-06-20 MRN: [id number]. Admitting Physician: Jonetta Osgood, MD HER:DEYCXK, Jama Flavors, MD  Admit Date: 07/24/2022 Discharge date: 07/26/2022  Recommendations for Outpatient Follow-up:  Being discharged home with hospice-optimize comfort measures.  Admitted From:  Home  Disposition: Hospice care   Discharge Condition: stable  CODE STATUS:   Code Status: DNR   Diet recommendation:  Diet Order             Diet Heart Room service appropriate? No; Fluid consistency: Thin  Diet effective now           Diet general                    Brief Summary: Patient is a 88 y.o.  male with history of metastatic prostate cancer-who presented to [facility] ED with headache and worsening generalized pain-neuroimaging was concerning for possible cerebellar stroke-he was transferred to [facility] for MRI brain and neuro evaluation.  See below for further details.   Significant events: 8/16>> worse generalized pain related to diffuse metastatic prostate cancer-headache-found to have acute CVA.  Admitted to [facility]. 8/17>> family decided against stroke work-up-seen by palliative care-comfort care with plans to discharge home with hospice.   Significant studies: 8/16>> CT head: New hypodensity in the right cerebral hemisphere-infarct versus metastatic disease.  Sclerotic metastatic lesions at the skull base. 8/16>> MRI brain: Multifocal acute/subacute ischemia in the right cerebellum.   Significant microbiology data: None   Procedures: None   Consults: Palliative care.  Brief Hospital Course: Acute cerebellar CVA: Headaches resolved-has some chronic RLE weakness due to pain that seems to have involved following up titration of narcotic regimen.  Family does not desire any further work-up for his CVA.  Plans are to discharge home with hospice/comfort care.    Metastatic prostate cancer with generalized pain: Presenting  with worsening generalized pain-much improved with transdermal fentanyl-and breakthrough short acting narcotics.  Appreciate palliative care evaluation-being discharged with home hospice-will be provided a supply of narcotics until he is able to be seen by outpatient hospice.     GERD: On PPI   Normocytic anemia: Likely related to underlying malignancy.    COPD: Not in exacerbation-continue bronchodilators  Palliative care: DNR in place-advanced prostate cancer-significant cancer pain over the past few weeks-family did not desire any further stroke work-up-evaluated by palliative care with plans to transition to comfort care and go to his daughter's house with hospice.  Social worker arranging home hospice.  BMI: Estimated body mass index is 20.74 kg/m as calculated from the following:   Height as of this encounter: '5\' 9"'$  (1.753 m).   Weight as of this encounter: 63.7 kg.    Discharge Diagnoses:  Principal Problem:   CVA (cerebral vascular accident) [facility]) Active Problems:   Prostate cancer metastatic to pelvis ([place])   Emphysema lung ([place])   Stroke [facility])   Discharge Instructions:  Activity:  As tolerated with Full fall precautions use walker/cane & assistance as needed  Discharge Instructions     Call MD for:  severe uncontrolled pain   Complete by: As directed    Diet general   Complete by: As directed    Discharge instructions   Complete by: As directed    Follow with Primary MD  Christain Sacramento, MD in 1-2 weeks  Please get a complete blood count and chemistry panel checked by your Primary MD at your next visit, and again as instructed  by your Primary MD.  Get Medicines reviewed and adjusted: Please take all your medications with you for your next visit with your Primary MD  Laboratory/radiological data: Please request your Primary MD to go over all hospital tests and procedure/radiological results at the follow up, please ask your Primary MD to get all Hospital records  sent to his/her office.  In some cases, they will be blood work, cultures and biopsy results pending at the time of your discharge. Please request that your primary care M.D. follows up on these results.  Also Note the following: If you experience worsening of your admission symptoms, develop shortness of breath, life threatening emergency, suicidal or homicidal thoughts you must seek medical attention immediately by calling 911 or calling your MD immediately  if symptoms less severe.  You must read complete instructions/literature along with all the possible adverse reactions/side effects for all the Medicines you take and that have been prescribed to you. Take any new Medicines after you have completely understood and accpet all the possible adverse reactions/side effects.   Do not drive when taking Pain medications or sleeping medications (Benzodaizepines)  Do not take more than prescribed Pain, Sleep and Anxiety Medications. It is not advisable to combine anxiety,sleep and pain medications without talking with your primary care practitioner  Special Instructions: If you have smoked or chewed Tobacco  in the last 2 yrs please stop smoking, stop any regular Alcohol  and or any Recreational drug use.  Wear Seat belts while driving.  Please note: You were cared for by a hospitalist during your hospital stay. Once you are discharged, your primary care physician will handle any further medical issues. Please note that NO REFILLS for any discharge medications will be authorized once you are discharged, as it is imperative that you return to your primary care physician (or establish a relationship with a primary care physician if you do not have one) for your post hospital discharge needs so that they can reassess your need for medications and monitor your lab values.   Increase activity slowly   Complete by: As directed       Allergies as of 07/26/2022       Reactions    Hydrocodone-acetaminophen Itching   Itching and tingling without a rash        Medication List     STOP taking these medications    Oxycodone HCl 10 MG Tabs       TAKE these medications    acetaminophen 650 MG CR tablet Commonly known as: TYLENOL Take 650 mg by mouth every 8 (eight) hours as needed for pain.   albuterol 108 (90 Base) MCG/ACT inhaler Commonly known as: VENTOLIN HFA Inhale 2 puffs into the lungs every 4 (four) hours as needed for wheezing or shortness of breath.   diphenhydramine-acetaminophen 25-500 MG Tabs tablet Commonly known as: TYLENOL PM Take 1 tablet by mouth at bedtime.   ibuprofen 800 MG tablet Commonly known as: ADVIL Take 800 mg by mouth 3 (three) times daily.   morphine CONCENTRATE 10 mg / 0.5 ml concentrated solution Take 0.25 mLs (5 mg total) by mouth every 4 (four) hours for chronic pain management.   pantoprazole 40 MG tablet Commonly known as: PROTONIX TAKE 1 TABLET BY MOUTH TWICE A DAY What changed: when to take this   polyethylene glycol 17 g packet Commonly known as: MiraLax Take 17 g by mouth daily.   Senexon-S 8.6-50 MG tablet Generic drug: senna-docusate Take 1 tablet by mouth  2 (two) times daily.   Symbicort 160-4.5 MCG/ACT inhaler Generic drug: budesonide-formoterol Inhale 1 puff into the lungs 2 (two) times daily.   tamsulosin 0.4 MG Caps capsule Commonly known as: FLOMAX Take 0.4 mg by mouth 2 (two) times daily.   Vitamin D3 125 MCG (5000 UT) Tabs Take 5,000 Units by mouth daily.        Follow-up Information     Christain Sacramento, MD. Schedule an appointment as soon as possible for a visit.   Specialty: Family Medicine Why: As needed Contact information: [address] [place] [address] [place], [facility]. Call.   Why: Please call [facility] and follow up for hospice support in the home. Contact information: [address] [place]  [address] [phone]                Allergies  Allergen Reactions   Hydrocodone-Acetaminophen Itching    Itching and tingling without a rash     Other Procedures/Studies: MR BRAIN WO CONTRAST  Result Date: 07/25/2022 CLINICAL DATA:  Abnormal head CT EXAM: MRI HEAD WITHOUT CONTRAST TECHNIQUE: Multiplanar, multiecho pulse sequences of the brain and surrounding structures were obtained without intravenous contrast. COMPARISON:  Head CT 07/24/2022 FINDINGS: Brain: Multifocal acute/subacute ischemia in the right cerebellum. No acute or chronic hemorrhage. There is multifocal hyperintense T2-weighted signal within the white matter. Parenchymal volume and CSF spaces are normal. The midline structures are normal. Vascular: Major flow voids are preserved. Skull and upper cervical spine: Normal calvarium and skull base. Visualized upper cervical spine and soft tissues are normal. Sinuses/Orbits:No paranasal sinus fluid levels or advanced mucosal thickening. No mastoid or middle ear effusion. Normal orbits. IMPRESSION: Multifocal acute/subacute ischemia in the right cerebellum. No hemorrhage or mass effect. Electronically Signed   By: Ulyses Jarred M.D.   On: 07/25/2022 02:12   CT Head Wo Contrast  Result Date: 07/24/2022 CLINICAL DATA:  Headache, new or worsening.  History of cancer. EXAM: CT HEAD WITHOUT CONTRAST TECHNIQUE: Contiguous axial images were obtained from the base of the skull through the vertex without intravenous contrast. RADIATION DOSE REDUCTION: This exam was performed according to the departmental dose-optimization program which includes automated exposure control, adjustment of the mA and/or kV according to patient size and/or use of iterative reconstruction technique. COMPARISON:  08/30/2021. FINDINGS: Brain: No acute intracranial hemorrhage, midline shift or mass effect. No extra-axial fluid collection. Generalized atrophy is noted. Periventricular white matter hypodensities are  present bilaterally. No hydrocephalus. There is a new hypodensity in the right cerebellar hemisphere measuring 1.2 cm. Vascular: No hyperdense vessel or unexpected calcification. Skull: No acute fracture. Scattered sclerotic lesions are noted at the skull base, previously characterized as metastatic disease. Sinuses/Orbits: Mucosal thickening is present in the ethmoid air cells and maxillary sinuses bilaterally and left sphenoid sinus. No acute orbital abnormality. Other: None. IMPRESSION: 1. No acute intracranial hemorrhage. 2. New hypodensity in the right cerebellar hemisphere. Differential diagnosis includes infarct versus metastatic disease given history of cancer. MRI is recommended for further evaluation. 3. Atrophy with chronic microvascular ischemic changes 4. Sclerotic metastatic lesions at the skull base, unchanged. Electronically Signed   By: Brett Fairy M.D.   On: 07/24/2022 21:57   DG Chest 2 View  Result Date: 07/24/2022 CLINICAL DATA:  Generalized body aches. EXAM: CHEST - 2 VIEW COMPARISON:  June 24, 2022 FINDINGS: The heart size and mediastinal contours are within normal limits. There is moderate severity calcification  of the aortic arch. Diffuse, chronic appearing increased lung markings are seen. There is no evidence of acute infiltrate, pleural effusion or pneumothorax. There is a chronic fracture of the mid left clavicle. Diffuse sclerotic osseous metastasis are seen. IMPRESSION: Chronic appearing increased lung markings without evidence of acute or active cardiopulmonary disease. Electronically Signed   By: Virgina Norfolk M.D.   On: 07/24/2022 18:56     TODAY-DAY OF DISCHARGE:  Subjective:   Donald Ellis today has no headache,no chest abdominal pain,no new weakness tingling or numbness, feels much better wants to go home today.  His pain is much better today.  Objective:   Blood pressure (!) 155/62, pulse 90, temperature 98 F (36.7 C), temperature source Oral, resp. rate  16, height '5\' 9"'$  (1.753 m), weight 63.7 kg, SpO2 98 %.  Intake/Output Summary (Last 24 hours) at 07/26/2022 1235 Last data filed at 07/26/2022 0900 Gross per 24 hour  Intake 240 ml  Output 1500 ml  Net -1260 ml   Filed Weights   07/24/22 1800 07/25/22 1543  Weight: 72.6 kg 63.7 kg    Exam: Awake Alert, Oriented *3, No new F.N deficits, Normal affect [place].AT,PERRAL Supple Neck,No JVD, No cervical lymphadenopathy appriciated.  Symmetrical Chest wall movement, Good air movement bilaterally, CTAB RRR,No Gallops,Rubs or new Murmurs, No Parasternal Heave +ve B.Sounds, Abd Soft, Non tender, No organomegaly appriciated, No rebound -guarding or rigidity. No Cyanosis, Clubbing or edema, No new Rash or bruise   PERTINENT RADIOLOGIC STUDIES: MR BRAIN WO CONTRAST  Result Date: 07/25/2022 CLINICAL DATA:  Abnormal head CT EXAM: MRI HEAD WITHOUT CONTRAST TECHNIQUE: Multiplanar, multiecho pulse sequences of the brain and surrounding structures were obtained without intravenous contrast. COMPARISON:  Head CT 07/24/2022 FINDINGS: Brain: Multifocal acute/subacute ischemia in the right cerebellum. No acute or chronic hemorrhage. There is multifocal hyperintense T2-weighted signal within the white matter. Parenchymal volume and CSF spaces are normal. The midline structures are normal. Vascular: Major flow voids are preserved. Skull and upper cervical spine: Normal calvarium and skull base. Visualized upper cervical spine and soft tissues are normal. Sinuses/Orbits:No paranasal sinus fluid levels or advanced mucosal thickening. No mastoid or middle ear effusion. Normal orbits. IMPRESSION: Multifocal acute/subacute ischemia in the right cerebellum. No hemorrhage or mass effect. Electronically Signed   By: Ulyses Jarred M.D.   On: 07/25/2022 02:12   CT Head Wo Contrast  Result Date: 07/24/2022 CLINICAL DATA:  Headache, new or worsening.  History of cancer. EXAM: CT HEAD WITHOUT CONTRAST TECHNIQUE: Contiguous axial  images were obtained from the base of the skull through the vertex without intravenous contrast. RADIATION DOSE REDUCTION: This exam was performed according to the departmental dose-optimization program which includes automated exposure control, adjustment of the mA and/or kV according to patient size and/or use of iterative reconstruction technique. COMPARISON:  08/30/2021. FINDINGS: Brain: No acute intracranial hemorrhage, midline shift or mass effect. No extra-axial fluid collection. Generalized atrophy is noted. Periventricular white matter hypodensities are present bilaterally. No hydrocephalus. There is a new hypodensity in the right cerebellar hemisphere measuring 1.2 cm. Vascular: No hyperdense vessel or unexpected calcification. Skull: No acute fracture. Scattered sclerotic lesions are noted at the skull base, previously characterized as metastatic disease. Sinuses/Orbits: Mucosal thickening is present in the ethmoid air cells and maxillary sinuses bilaterally and left sphenoid sinus. No acute orbital abnormality. Other: None. IMPRESSION: 1. No acute intracranial hemorrhage. 2. New hypodensity in the right cerebellar hemisphere. Differential diagnosis includes infarct versus metastatic disease given history of cancer. MRI is  recommended for further evaluation. 3. Atrophy with chronic microvascular ischemic changes 4. Sclerotic metastatic lesions at the skull base, unchanged. Electronically Signed   By: Brett Fairy M.D.   On: 07/24/2022 21:57   DG Chest 2 View  Result Date: 07/24/2022 CLINICAL DATA:  Generalized body aches. EXAM: CHEST - 2 VIEW COMPARISON:  June 24, 2022 FINDINGS: The heart size and mediastinal contours are within normal limits. There is moderate severity calcification of the aortic arch. Diffuse, chronic appearing increased lung markings are seen. There is no evidence of acute infiltrate, pleural effusion or pneumothorax. There is a chronic fracture of the mid left clavicle. Diffuse  sclerotic osseous metastasis are seen. IMPRESSION: Chronic appearing increased lung markings without evidence of acute or active cardiopulmonary disease. Electronically Signed   By: Virgina Norfolk M.D.   On: 07/24/2022 18:56     PERTINENT LAB RESULTS: CBC: Recent Labs    07/24/22 1832 07/25/22 0644  WBC 8.3 7.1  HGB 10.6* 10.6*  HCT 32.9* 32.6*  PLT 149* 144*   CMET CMP     Component Value Date/Time   NA 135 07/24/2022 1832   K 4.4 07/24/2022 1832   CL 107 07/24/2022 1832   CO2 24 07/24/2022 1832   GLUCOSE 102 (H) 07/24/2022 1832   BUN 26 (H) 07/24/2022 1832   CREATININE 0.51 (L) 07/25/2022 0644   CALCIUM 7.8 (L) 07/24/2022 1832   PROT 7.3 07/24/2022 1832   ALBUMIN 3.5 07/24/2022 1832   AST 80 (H) 07/24/2022 1832   ALT 14 07/24/2022 1832   ALKPHOS 1,689 (H) 07/24/2022 1832   BILITOT 0.6 07/24/2022 1832   GFRNONAA >60 07/25/2022 0644   GFRAA >60 08/31/2019 0718    GFR Estimated Creatinine Clearance: 59.7 mL/min (A) (by C-G formula based on SCr of 0.51 mg/dL (L)). No results for input(s): "LIPASE", "AMYLASE" in the last 72 hours. No results for input(s): "CKTOTAL", "CKMB", "CKMBINDEX", "TROPONINI" in the last 72 hours. Invalid input(s): "POCBNP" No results for input(s): "DDIMER" in the last 72 hours. No results for input(s): "HGBA1C" in the last 72 hours. No results for input(s): "CHOL", "HDL", "LDLCALC", "TRIG", "CHOLHDL", "LDLDIRECT" in the last 72 hours. No results for input(s): "TSH", "T4TOTAL", "T3FREE", "THYROIDAB" in the last 72 hours.  Invalid input(s): "FREET3" No results for input(s): "VITAMINB12", "FOLATE", "FERRITIN", "TIBC", "IRON", "RETICCTPCT" in the last 72 hours. Coags: Recent Labs    07/24/22 1832  INR 1.2   Microbiology: No results found for this or any previous visit (from the past 240 hour(s)).  FURTHER DISCHARGE INSTRUCTIONS:  Get Medicines reviewed and adjusted: Please take all your medications with you for your next visit with your  Primary MD  Laboratory/radiological data: Please request your Primary MD to go over all hospital tests and procedure/radiological results at the follow up, please ask your Primary MD to get all Hospital records sent to his/her office.  In some cases, they will be blood work, cultures and biopsy results pending at the time of your discharge. Please request that your primary care M.D. goes through all the records of your hospital data and follows up on these results.  Also Note the following: If you experience worsening of your admission symptoms, develop shortness of breath, life threatening emergency, suicidal or homicidal thoughts you must seek medical attention immediately by calling 911 or calling your MD immediately  if symptoms less severe.  You must read complete instructions/literature along with all the possible adverse reactions/side effects for all the Medicines you take and that  have been prescribed to you. Take any new Medicines after you have completely understood and accpet all the possible adverse reactions/side effects.   Do not drive when taking Pain medications or sleeping medications (Benzodaizepines)  Do not take more than prescribed Pain, Sleep and Anxiety Medications. It is not advisable to combine anxiety,sleep and pain medications without talking with your primary care practitioner  Special Instructions: If you have smoked or chewed Tobacco  in the last 2 yrs please stop smoking, stop any regular Alcohol  and or any Recreational drug use.  Wear Seat belts while driving.  Please note: You were cared for by a hospitalist during your hospital stay. Once you are discharged, your primary care physician will handle any further medical issues. Please note that NO REFILLS for any discharge medications will be authorized once you are discharged, as it is imperative that you return to your primary care physician (or establish a relationship with a primary care physician if you do  not have one) for your post hospital discharge needs so that they can reassess your need for medications and monitor your lab values.  Total Time spent coordinating discharge including counseling, education and face to face time equals greater than 30 minutes.  SignedOren Binet 07/26/2022 12:35 PM

## 2022-08-09 DEATH — deceased

## 2022-11-09 IMAGING — CT CT ABD-PELV W/ CM
2 of 4 series · 15 of 46 positions shown, 17 images · IV contrast (agent unspecified)
Comparison: 03/11/2021

CLINICAL DATA: RIGHT side and lower quadrant pain, severe abdominal
pain, rectal bleeding today, fatigue; history of prostate cancer per
prior imaging exams

EXAM:
CT ABDOMEN AND PELVIS WITH CONTRAST
TECHNIQUE: Multidetector CT imaging of the abdomen and pelvis was performed
using the standard protocol following bolus administration of
intravenous contrast.

[Series 3: a/p w/ 5mm · axial · 0.77mm/px · z∈[+786,+1216]mm · 12 of 94 slices shown, 14 images]
[im 4/94  soft-tissue]
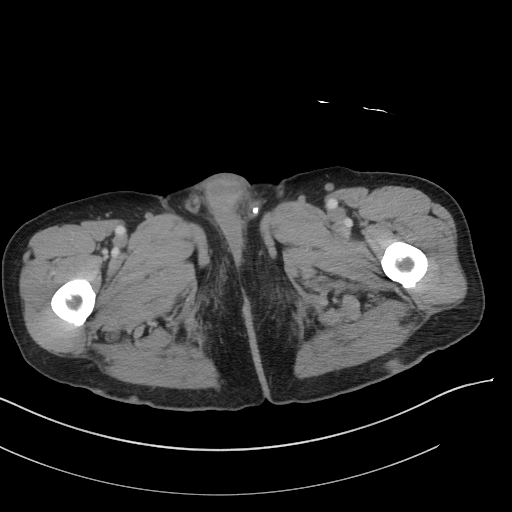
[im 4/94  bone]
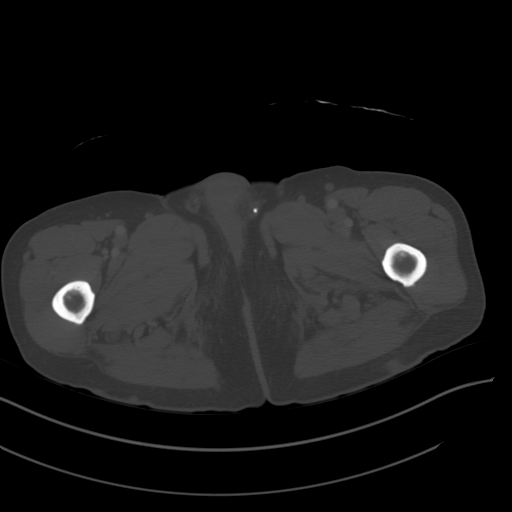
[im 12/94  soft-tissue]
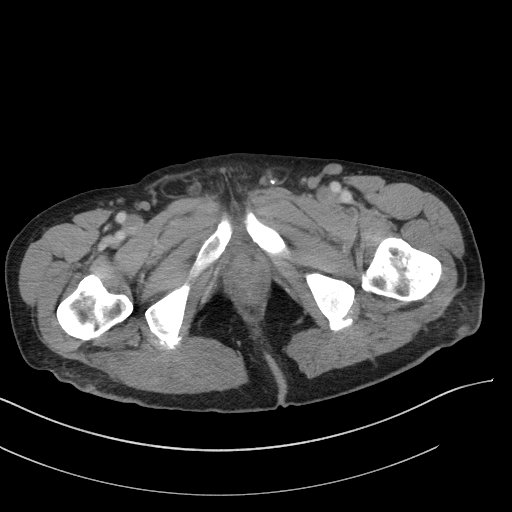
[im 20/94  soft-tissue]
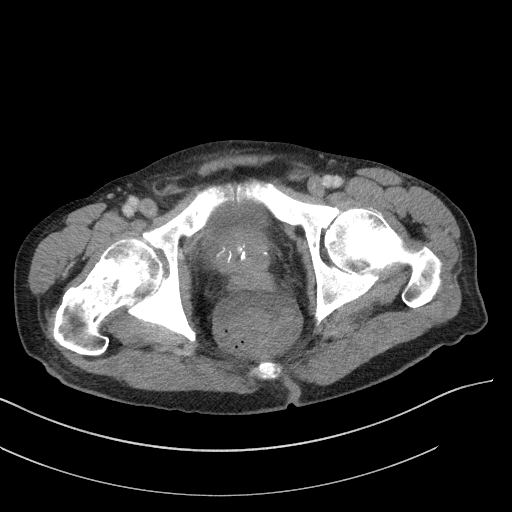
[im 28/94  soft-tissue]
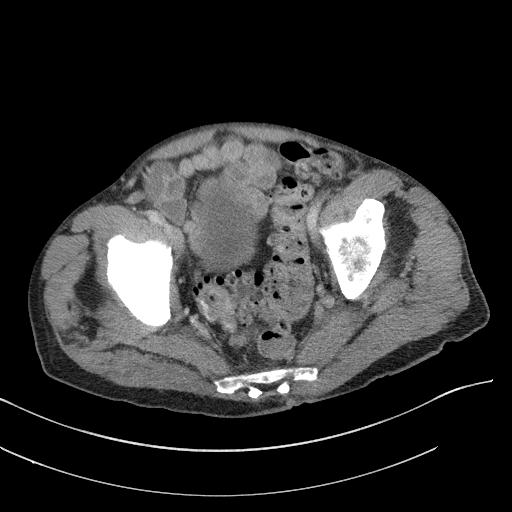
[im 35/94  soft-tissue]
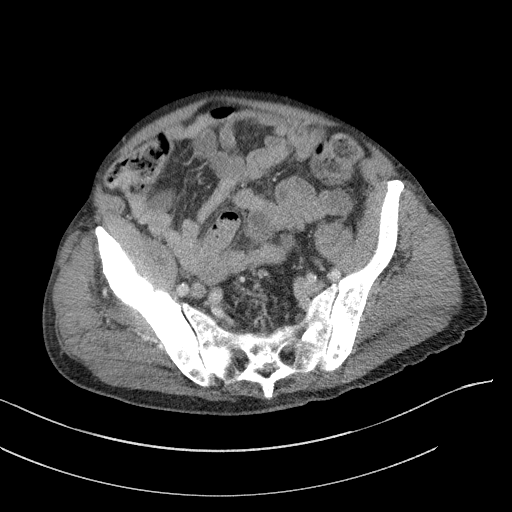
[im 43/94  soft-tissue]
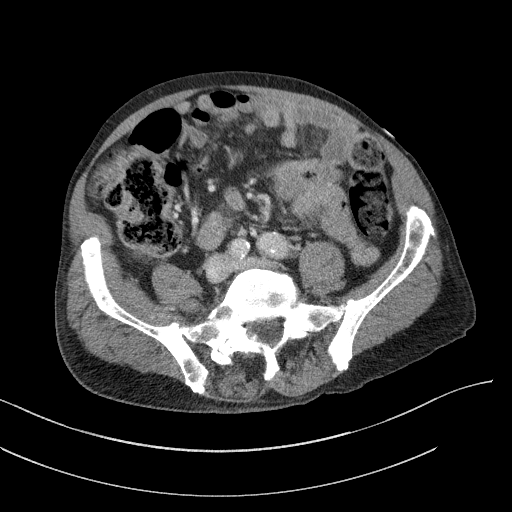
[im 51/94  soft-tissue]
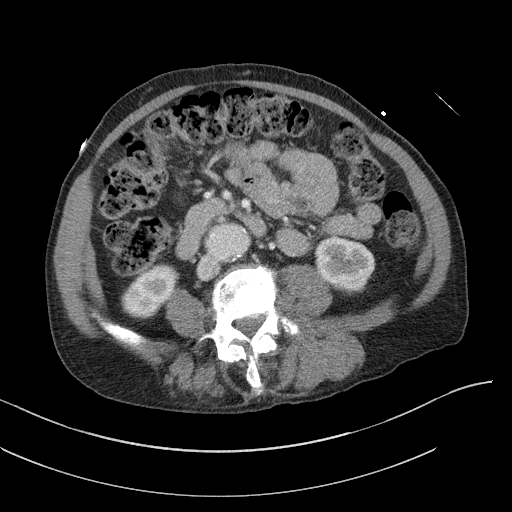
[im 59/94  soft-tissue]
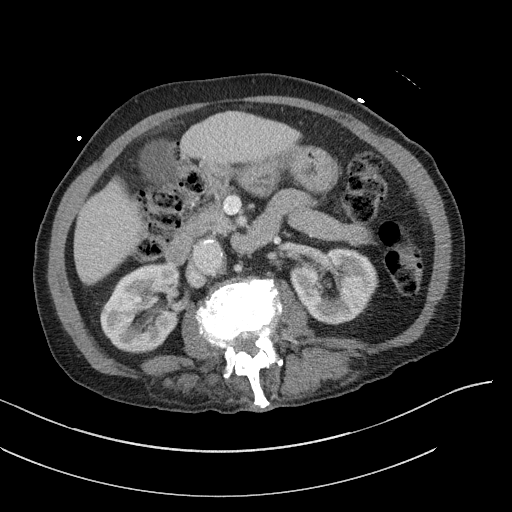
[im 66/94  soft-tissue]
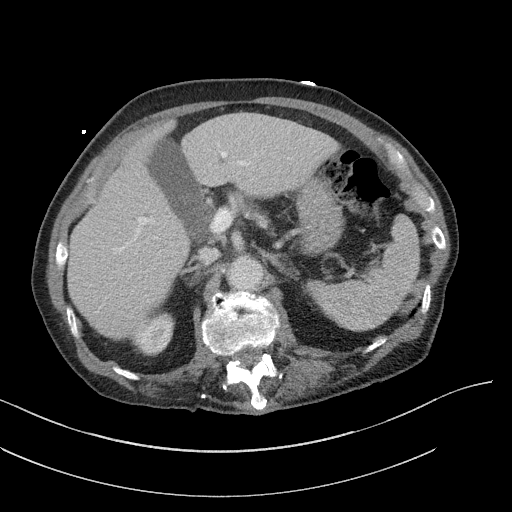
[im 66/94  bone]
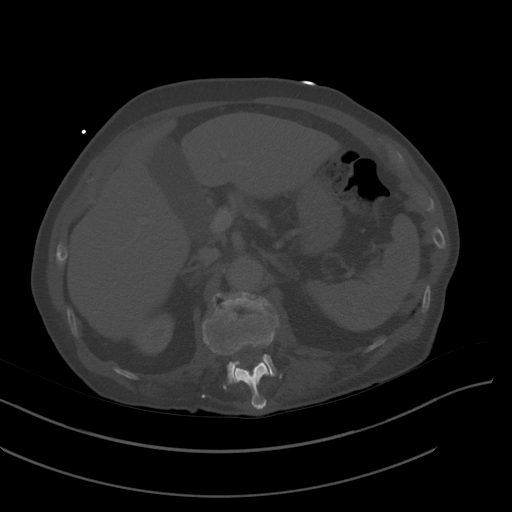
[im 74/94  soft-tissue]
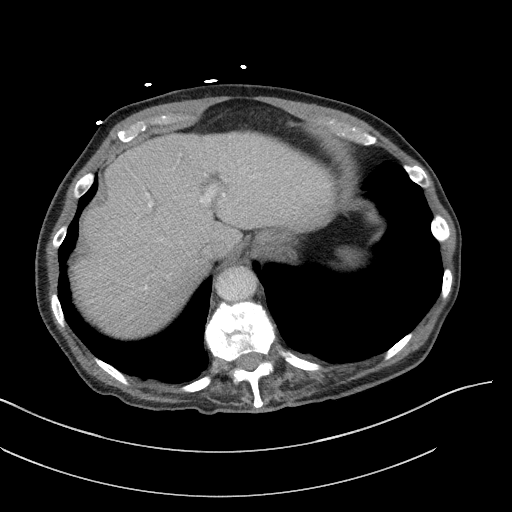
[im 82/94  soft-tissue]
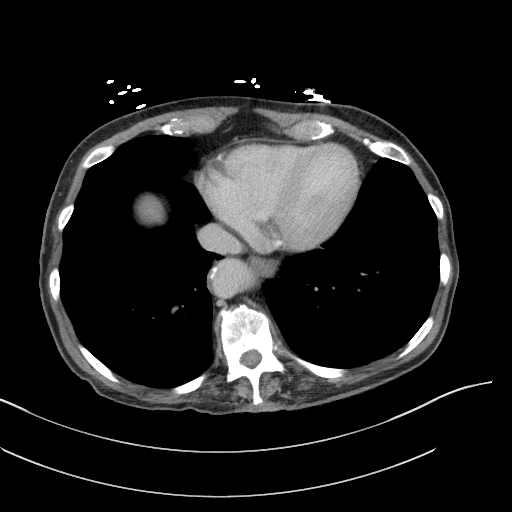
[im 90/94  soft-tissue]
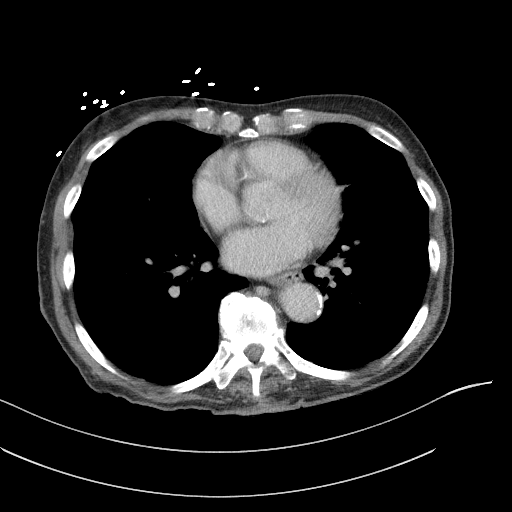

[Series 6: a/p w/ cor · coronal · 0.74mm/px · 3 of 160 slices shown]
[im 54/160  soft-tissue]
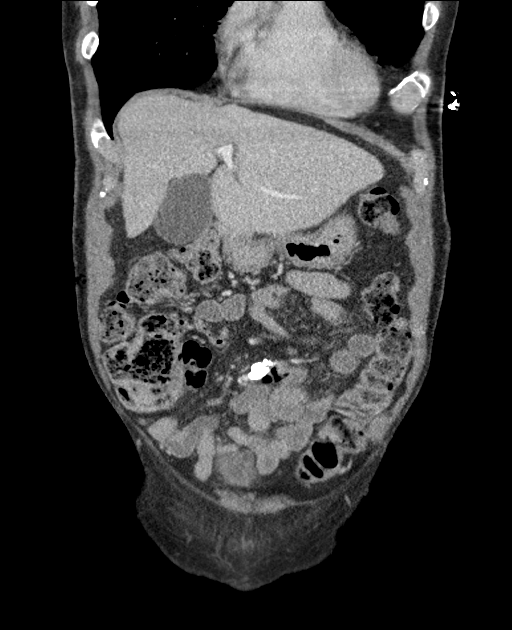
[im 71/160  soft-tissue]
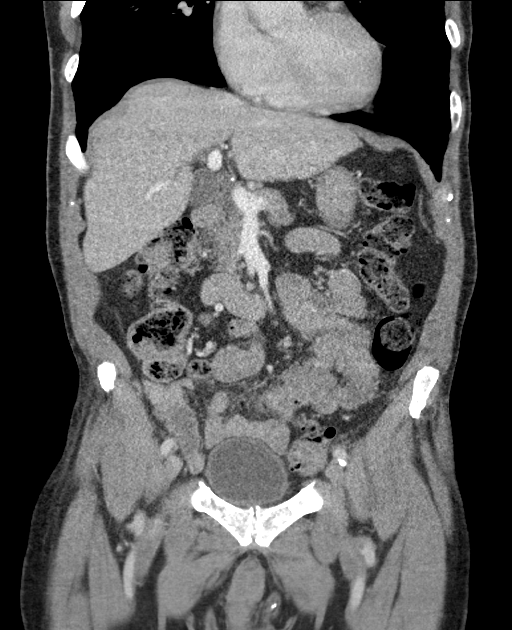
[im 89/160  soft-tissue]
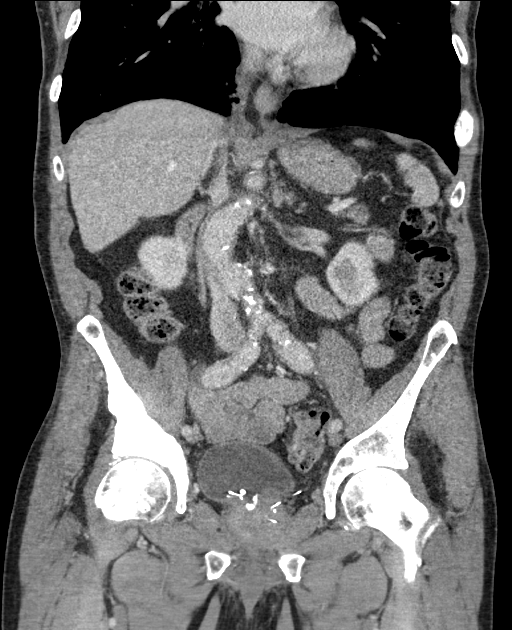

[15 of 46 positions shown; findings below may reference images not displayed]

RADIATION DOSE REDUCTION: This exam was performed according to the
departmental dose-optimization program which includes automated
exposure control, adjustment of the mA and/or kV according to
patient size and/or use of iterative reconstruction technique.

CONTRAST:  75mL OMNIPAQUE IOHEXOL 350 MG/ML SOLN IV. No oral
contrast.
FINDINGS: Lower chest: Lung bases emphysematous but clear

Hepatobiliary: Gallbladder and liver normal appearance

Pancreas: Normal appearance

Spleen: Normal appearance

Adrenals/Urinary Tract: Adrenal glands, kidneys, ureters, and
bladder normal appearance

Stomach/Bowel: Normal appendix. Prominent stool throughout colon.
Sigmoid diverticulosis without evidence of diverticulitis.

Vascular/Lymphatic: Atherosclerotic calcifications aorta and iliac
arteries. Aneurysmal dilatation mid abdominal aorta 3.6 x 3.6 cm
image 42, previously 3.3 cm, fusiform, currently approximately
cm length. Common iliac arteries prominent in size bilaterally 16 mm
RIGHT and 14 mm LEFT. No adenopathy.

Reproductive: Mildly enlarged prostate gland with clips.

Other: Small BILATERAL inguinal hernias containing fat. No free air
or free fluid.

Musculoskeletal: Diffuse sclerotic osseous metastases progressive
since previous exam especially in RIGHT pelvis. Degenerative disc
disease changes thoracolumbar spine with dextroconvex scoliosis.
IMPRESSION: Sigmoid diverticulosis without evidence of diverticulitis.

Prominent stool throughout colon.

Small BILATERAL inguinal hernias containing fat.

Diffuse sclerotic osseous metastases progressive since previous exam
especially in RIGHT pelvis.

Fusiform 3.6 x 3.6 cm abdominal aortic aneurysm, 3.3 cm length
Recommend follow-up ultrasound every 2 years. This recommendation
follows ACR consensus guidelines: White Paper of the ACR Incidental
Findings Committee II on Vascular Findings. [HOSPITAL] 4652;
[DATE].

Aortic Atherosclerosis (K4SN0-BJI.I).

Emphysema (K4SN0-QHZ.F).

Aortic aneurysm NOS (K4SN0-EYV.V).
